# Patient Record
Sex: Female | Born: 1945 | Race: White | Hispanic: No | Marital: Married | Smoking: Never smoker
Health system: Southern US, Community
[De-identification: ages and names within clinical notes are randomized; demographics above are authoritative.]

## PROBLEM LIST (undated history)

## (undated) DIAGNOSIS — E785 Hyperlipidemia, unspecified: Secondary | ICD-10-CM

## (undated) DIAGNOSIS — I10 Essential (primary) hypertension: Secondary | ICD-10-CM

## (undated) DIAGNOSIS — Z8701 Personal history of pneumonia (recurrent): Secondary | ICD-10-CM

## (undated) DIAGNOSIS — T7840XA Allergy, unspecified, initial encounter: Secondary | ICD-10-CM

## (undated) DIAGNOSIS — R011 Cardiac murmur, unspecified: Secondary | ICD-10-CM

## (undated) HISTORY — DX: Personal history of pneumonia (recurrent): Z87.01

## (undated) HISTORY — DX: Essential (primary) hypertension: I10

## (undated) HISTORY — DX: Cardiac murmur, unspecified: R01.1

## (undated) HISTORY — DX: Hyperlipidemia, unspecified: E78.5

## (undated) HISTORY — DX: Allergy, unspecified, initial encounter: T78.40XA

## (undated) HISTORY — PX: OTHER SURGICAL HISTORY: SHX169

---

## 2012-11-18 ENCOUNTER — Encounter (HOSPITAL_COMMUNITY): Payer: Self-pay

## 2012-11-18 ENCOUNTER — Emergency Department (HOSPITAL_COMMUNITY): Payer: Medicare Other

## 2012-11-18 ENCOUNTER — Inpatient Hospital Stay (HOSPITAL_COMMUNITY)
Admission: EM | Admit: 2012-11-18 | Discharge: 2012-11-24 | DRG: 871 | Disposition: A | Discharge status: Home / Self Care | Payer: Medicare Other | Attending: Emergency Medicine | Admitting: Emergency Medicine

## 2012-11-18 DIAGNOSIS — E669 Obesity, unspecified: Secondary | ICD-10-CM | POA: Diagnosis present

## 2012-11-18 DIAGNOSIS — K5289 Other specified noninfective gastroenteritis and colitis: Secondary | ICD-10-CM | POA: Diagnosis present

## 2012-11-18 DIAGNOSIS — E872 Acidosis, unspecified: Secondary | ICD-10-CM | POA: Diagnosis present

## 2012-11-18 DIAGNOSIS — N2589 Other disorders resulting from impaired renal tubular function: Secondary | ICD-10-CM | POA: Diagnosis present

## 2012-11-18 DIAGNOSIS — J9600 Acute respiratory failure, unspecified whether with hypoxia or hypercapnia: Secondary | ICD-10-CM | POA: Diagnosis not present

## 2012-11-18 DIAGNOSIS — E876 Hypokalemia: Secondary | ICD-10-CM | POA: Diagnosis present

## 2012-11-18 DIAGNOSIS — D649 Anemia, unspecified: Secondary | ICD-10-CM | POA: Diagnosis present

## 2012-11-18 DIAGNOSIS — R578 Other shock: Secondary | ICD-10-CM | POA: Diagnosis not present

## 2012-11-18 DIAGNOSIS — A400 Sepsis due to streptococcus, group A: Secondary | ICD-10-CM | POA: Diagnosis not present

## 2012-11-18 DIAGNOSIS — A409 Streptococcal sepsis, unspecified: Principal | ICD-10-CM | POA: Diagnosis present

## 2012-11-18 DIAGNOSIS — N179 Acute kidney failure, unspecified: Secondary | ICD-10-CM | POA: Diagnosis present

## 2012-11-18 DIAGNOSIS — A419 Sepsis, unspecified organism: Secondary | ICD-10-CM | POA: Diagnosis not present

## 2012-11-18 DIAGNOSIS — N390 Urinary tract infection, site not specified: Secondary | ICD-10-CM | POA: Diagnosis present

## 2012-11-18 DIAGNOSIS — R6510 Systemic inflammatory response syndrome (SIRS) of non-infectious origin without acute organ dysfunction: Secondary | ICD-10-CM | POA: Diagnosis present

## 2012-11-18 DIAGNOSIS — A088 Other specified intestinal infections: Secondary | ICD-10-CM | POA: Diagnosis present

## 2012-11-18 DIAGNOSIS — R6520 Severe sepsis without septic shock: Secondary | ICD-10-CM | POA: Diagnosis present

## 2012-11-18 DIAGNOSIS — K529 Noninfective gastroenteritis and colitis, unspecified: Secondary | ICD-10-CM

## 2012-11-18 DIAGNOSIS — G929 Unspecified toxic encephalopathy: Secondary | ICD-10-CM | POA: Diagnosis not present

## 2012-11-18 DIAGNOSIS — G92 Toxic encephalopathy: Secondary | ICD-10-CM | POA: Diagnosis not present

## 2012-11-18 DIAGNOSIS — E871 Hypo-osmolality and hyponatremia: Secondary | ICD-10-CM | POA: Diagnosis present

## 2012-11-18 DIAGNOSIS — J189 Pneumonia, unspecified organism: Secondary | ICD-10-CM | POA: Diagnosis present

## 2012-11-18 LAB — COMPREHENSIVE METABOLIC PANEL
AST: 25 U/L (ref 0–37)
Albumin: 2.9 g/dL — ABNORMAL LOW (ref 3.5–5.2)
CO2: 17 mEq/L — ABNORMAL LOW (ref 19–32)
Calcium: 7.9 mg/dL — ABNORMAL LOW (ref 8.4–10.5)
Creatinine, Ser: 4.32 mg/dL — ABNORMAL HIGH (ref 0.50–1.10)
GFR calc non Af Amer: 10 mL/min — ABNORMAL LOW (ref >90)

## 2012-11-18 LAB — CBC WITH DIFFERENTIAL/PLATELET
Eosinophils Absolute: 0 10*3/uL (ref 0.0–0.7)
MCH: 29.5 pg (ref 26.0–34.0)
MCHC: 34.1 g/dL (ref 30.0–36.0)
Monocytes Absolute: 0.6 10*3/uL (ref 0.1–1.0)
Neutrophils Relative %: 95 % — ABNORMAL HIGH (ref 43–77)
Platelets: 240 10*3/uL (ref 150–400)

## 2012-11-18 LAB — BLOOD GAS, ARTERIAL
Bicarbonate: 13.5 mEq/L — ABNORMAL LOW (ref 20.0–24.0)
Drawn by: 22223
O2 Content: 3 L/min
Patient temperature: 37
pH, Arterial: 7.321 — ABNORMAL LOW (ref 7.350–7.450)
pO2, Arterial: 65.5 mmHg — ABNORMAL LOW (ref 80.0–100.0)

## 2012-11-18 LAB — URINE MICROSCOPIC-ADD ON

## 2012-11-18 LAB — URINALYSIS, ROUTINE W REFLEX MICROSCOPIC
Hgb urine dipstick: NEGATIVE
Specific Gravity, Urine: >1.03 — ABNORMAL HIGH (ref 1.005–1.030)
Urobilinogen, UA: 0.2 mg/dL (ref 0.0–1.0)

## 2012-11-18 LAB — LACTIC ACID, PLASMA: Lactic Acid, Venous: 1.4 mmol/L (ref 0.5–2.2)

## 2012-11-18 LAB — PHOSPHORUS: Phosphorus: 3.9 mg/dL (ref 2.3–4.6)

## 2012-11-18 MED ORDER — SODIUM BICARBONATE 8.4 % IV SOLN
INTRAVENOUS | Status: DC
  Administered 2012-11-19: 01:00:00 via INTRAVENOUS
  Filled 2012-11-18 (×3): qty 1000

## 2012-11-18 MED ORDER — ONDANSETRON HCL 4 MG/2ML IJ SOLN: 4.0000 mg | INTRAMUSCULAR | Status: DC | PRN

## 2012-11-18 MED ORDER — VANCOMYCIN 50 MG/ML ORAL SOLUTION
250.0000 mg | Freq: Four times a day (QID) | ORAL | Status: DC
  Administered 2012-11-19: 250 mg via ORAL
  Filled 2012-11-18 (×6): qty 5

## 2012-11-18 MED ORDER — ONDANSETRON HCL 4 MG/2ML IJ SOLN
4.0000 mg | Freq: Once | INTRAMUSCULAR | Status: AC
  Administered 2012-11-18: 4 mg via INTRAVENOUS
  Filled 2012-11-18: qty 2

## 2012-11-18 MED ORDER — SODIUM CHLORIDE 0.9 % IV BOLUS (SEPSIS): 1000.0000 mL | Freq: Once | INTRAVENOUS | Status: DC

## 2012-11-18 MED ORDER — TRAZODONE HCL 50 MG PO TABS: 50.0000 mg | ORAL_TABLET | Freq: Every evening | ORAL | Status: DC | PRN

## 2012-11-18 MED ORDER — ENOXAPARIN SODIUM 30 MG/0.3ML ~~LOC~~ SOLN
30.0000 mg | SUBCUTANEOUS | Status: DC
  Filled 2012-11-18 (×2): qty 0.3

## 2012-11-18 MED ORDER — CIPROFLOXACIN IN D5W 400 MG/200ML IV SOLN
400.0000 mg | Freq: Once | INTRAVENOUS | Status: AC
  Administered 2012-11-19: 400 mg via INTRAVENOUS
  Filled 2012-11-18: qty 200

## 2012-11-18 MED ORDER — SODIUM CHLORIDE 0.9 % IV SOLN
1000.0000 mL | Freq: Once | INTRAVENOUS | Status: AC
  Administered 2012-11-18: 1000 mL via INTRAVENOUS

## 2012-11-18 MED ORDER — MORPHINE SULFATE 4 MG/ML IJ SOLN: 4.0000 mg | Freq: Once | INTRAMUSCULAR | Status: DC

## 2012-11-18 MED ORDER — ACETAMINOPHEN 325 MG PO TABS: 650.0000 mg | ORAL_TABLET | ORAL | Status: DC | PRN

## 2012-11-18 MED ORDER — METRONIDAZOLE IN NACL 5-0.79 MG/ML-% IV SOLN
500.0000 mg | Freq: Three times a day (TID) | INTRAVENOUS | Status: DC
  Administered 2012-11-19: 500 mg via INTRAVENOUS
  Filled 2012-11-18 (×3): qty 100

## 2012-11-18 MED ORDER — SODIUM CHLORIDE 0.9 % IV SOLN
1000.0000 mL | INTRAVENOUS | Status: DC
  Administered 2012-11-18: 1000 mL via INTRAVENOUS

## 2012-11-18 MED ORDER — ACETAMINOPHEN 500 MG PO TABS
1000.0000 mg | ORAL_TABLET | Freq: Once | ORAL | Status: AC
  Administered 2012-11-18: 1000 mg via ORAL
  Filled 2012-11-18: qty 2

## 2012-11-18 MED ORDER — ONDANSETRON HCL 4 MG/2ML IJ SOLN: 4.0000 mg | Freq: Three times a day (TID) | INTRAMUSCULAR | Status: DC | PRN

## 2012-11-18 MED ORDER — SODIUM CHLORIDE 0.9 % IJ SOLN
3.0000 mL | Freq: Two times a day (BID) | INTRAMUSCULAR | Status: DC
  Administered 2012-11-19 – 2012-11-24 (×7): 3 mL via INTRAVENOUS

## 2012-11-18 MED ORDER — POTASSIUM CHLORIDE 10 MEQ/100ML IV SOLN
10.0000 meq | INTRAVENOUS | Status: AC
  Administered 2012-11-19 (×3): 10 meq via INTRAVENOUS
  Filled 2012-11-18: qty 100
  Filled 2012-11-18: qty 300

## 2012-11-18 MED ORDER — SODIUM CHLORIDE 0.9 % IV BOLUS (SEPSIS)
1000.0000 mL | Freq: Once | INTRAVENOUS | Status: AC
  Administered 2012-11-18: 1000 mL via INTRAVENOUS

## 2012-11-18 MED ORDER — DEXTROSE 5 % IV SOLN
1.0000 g | Freq: Once | INTRAVENOUS | Status: AC
  Administered 2012-11-18: 1 g via INTRAVENOUS
  Filled 2012-11-18: qty 10

## 2012-11-18 MED ORDER — SODIUM CHLORIDE 0.9 % IV SOLN: INTRAVENOUS | Status: DC

## 2012-11-18 NOTE — ED Notes (Signed)
Husband states pt has n/v/d and high fever that started yesterday. Pt lethargic today. Has not been to a doctor sonce [phone]

## 2012-11-18 NOTE — ED Notes (Signed)
Patient arrives with family c/o nausea/vomiting/diarrhea. Patient lethargic, altered per family. Patient with deep respirations, skin pale. Patient tachycardic on monitor. Rectal temp 105.6, cooling blanket applied, PO tylenol given, and IV normal saline bolus initiated. RN at bedside since patient arrived in room.

## 2012-11-18 NOTE — ED Provider Notes (Signed)
History     CSN: [id number]  Arrival date & time 11/18/12  1810   First MD Initiated Contact with Patient 11/18/12 1824      Chief Complaint  Patient presents with  . Emesis    (Consider location/radiation/quality/duration/timing/severity/associated sxs/prior treatment) Patient is a 66 y.o. female presenting with vomiting. The history is provided by the spouse. The history is limited by the condition of the patient (Altered mental status).  Emesis She started getting sick yesterday with vomiting and diarrhea. Multiple family members have had similar illnesses. She was given Imodium for diarrhea. She's continued to vomit today and has vomited at least twice according to her husband. She started running a fever today which has gone up as high as 105. She is noted to be confused. Her daughter had textd it her in the morning and got a response, but this afternoon the daughter sent her checked and did not get a response. She has not gotten anything for the fever her nausea.   History reviewed. No pertinent past medical history.  History reviewed. No pertinent past surgical history.  No family history on file.  History  Substance Use Topics  . Smoking status: Not on file  . Smokeless tobacco: Not on file  . Alcohol Use: No    OB History   Grav Para Term Preterm Abortions TAB SAB Ect Mult Living                  Review of Systems  Unable to perform ROS: Mental status change  Gastrointestinal: Positive for vomiting.    Allergies  Review of patient's allergies indicates no known allergies.  Home Medications  No current outpatient prescriptions on file.  BP 108/65  Pulse 128  Temp(Src) 105.6 F (40.9 C) (Rectal)  Ht 5\' 6"  (1.676 m)  Wt 150 lb (68.04 kg)  BMI 24.22 kg/m2  SpO2 95%  Physical Exam  Nursing note and vitals reviewed.  67 year old female, resting comfortably and in no acute distress. Vital signs are significant for tachycardia with a rate of 128,  tachypnea with respiratory rate of 30, and fever with temperature 105.6 rectally. Oxygen saturation is 95%, which is normal. Head is normocephalic and atraumatic. PERRLA, EOMI. Oropharynx is clear. Neck is nontender and supple without adenopathy or JVD. Back is nontender and there is no CVA tenderness. Lungs are clear without rales, wheezes, or rhonchi. Chest is nontender. Heart is tachycardic and right without murmur. Abdomen is soft, flat, nontender without masses or hepatosplenomegaly and peristalsis is normoactive. Extremities have no cyanosis or edema, full range of motion is present. Skin is warm and dry without rash. Neurologic: She is lethargic but arousable. She is oriented to person. She speaks in very soft whispers. Cranial nerves are grossly intact, there are no gross motor or sensory deficits.  ED Course  Procedures (including critical care time)  Results for orders placed during the hospital encounter of 11/18/12  CBC WITH DIFFERENTIAL      Result Value Range   WBC 28.1 (*) 4.0 - 10.5 K/uL   RBC 4.57  3.87 - 5.11 MIL/uL   Hemoglobin 13.5  12.0 - 15.0 g/dL   HCT 14.7  82.9 - 56.2 %   MCV 86.7  78.0 - 100.0 fL   MCH 29.5  26.0 - 34.0 pg   MCHC 34.1  30.0 - 36.0 g/dL   RDW 13.0  86.5 - 78.4 %   Platelets 240  150 - 400 K/uL   Neutrophils  Relative 95 (*) 43 - 77 %   Lymphocytes Relative 3 (*) 12 - 46 %   Monocytes Relative 2 (*) 3 - 12 %   Eosinophils Relative 0  0 - 5 %   Basophils Relative 0  0 - 1 %   Neutro Abs 26.7 (*) 1.7 - 7.7 K/uL   Lymphs Abs 0.8  0.7 - 4.0 K/uL   Monocytes Absolute 0.6  0.1 - 1.0 K/uL   Eosinophils Absolute 0.0  0.0 - 0.7 K/uL   Basophils Absolute 0.0  0.0 - 0.1 K/uL   WBC Morphology MILD LEFT SHIFT (1-5% METAS, OCC MYELO, OCC BANDS)     Smear Review LARGE PLATELETS PRESENT    COMPREHENSIVE METABOLIC PANEL      Result Value Range   Sodium 128 (*) 135 - 145 mEq/L   Potassium 3.3 (*) 3.5 - 5.1 mEq/L   Chloride 91 (*) 96 - 112 mEq/L   CO2  17 (*) 19 - 32 mEq/L   Glucose, Bld 110 (*) 70 - 99 mg/dL   BUN 56 (*) 6 - 23 mg/dL   Creatinine, Ser 1.61 (*) 0.50 - 1.10 mg/dL   Calcium 7.9 (*) 8.4 - 10.5 mg/dL   Total Protein 7.1  6.0 - 8.3 g/dL   Albumin 2.9 (*) 3.5 - 5.2 g/dL   AST 25  0 - 37 U/L   ALT 35  0 - 35 U/L   Alkaline Phosphatase 149 (*) 39 - 117 U/L   Total Bilirubin 1.5 (*) 0.3 - 1.2 mg/dL   GFR calc non Af Amer 10 (*) >90 mL/min   GFR calc Af Amer 11 (*) >90 mL/min  URINALYSIS, ROUTINE W REFLEX MICROSCOPIC      Result Value Range   Color, Urine YELLOW  YELLOW   APPearance CLEAR  CLEAR   Specific Gravity, Urine >1.030 (*) 1.005 - 1.030   pH 5.5  5.0 - 8.0   Glucose, UA NEGATIVE  NEGATIVE mg/dL   Hgb urine dipstick NEGATIVE  NEGATIVE   Bilirubin Urine NEGATIVE  NEGATIVE   Ketones, ur NEGATIVE  NEGATIVE mg/dL   Protein, ur NEGATIVE  NEGATIVE mg/dL   Urobilinogen, UA 0.2  0.0 - 1.0 mg/dL   Nitrite NEGATIVE  NEGATIVE   Leukocytes, UA TRACE (*) NEGATIVE  URINE MICROSCOPIC-ADD ON      Result Value Range   Squamous Epithelial / LPF RARE  RARE   WBC, UA 21-50  <3 WBC/hpf   RBC / HPF 0-2  <3 RBC/hpf   Bacteria, UA MANY (*) RARE   Casts HYALINE CASTS (*) NEGATIVE   Dg Chest Portable 1 View  11/18/2012  *RADIOLOGY REPORT*  Clinical Data: Altered mental status, vomiting  PORTABLE CHEST - 1 VIEW  Comparison: None.  Findings: Low lung volumes with vascular crowding.  Mild bibasilar opacities, likely atelectasis.  No pleural effusion or pneumothorax.  The heart is normal in size.  IMPRESSION: Low lung volumes with bibasilar atelectasis.   Original Report Authenticated By: Charline Bills, M.D.       1. Urinary tract infection   2. Acute renal failure   3. Hyponatremia   4. Hypokalemia   5. Elevated liver function tests   6. Metabolic acidosis    CRITICAL CARE Performed by: WRUEA,[email or web address]   Total critical care time: 90 minutes  Critical care time was exclusive of separately billable procedures and treating other  patients.  Critical care was necessary to treat or prevent imminent or life-threatening deterioration.  Critical care  was time spent personally by me on the following activities: development of treatment plan with patient and/or surrogate as well as nursing, discussions with consultants, evaluation of patient's response to treatment, examination of patient, obtaining history from patient or surrogate, ordering and performing treatments and interventions, ordering and review of laboratory studies, ordering and review of radiographic studies, pulse oximetry and re-evaluation of patient's condition.    MDM  Fever and altered mental status. It is not clear at this point whether altered mental status is slowly due to the fever. Septic workup will be done and she'll be given IV hydration and antipyretics.  Laboratory workup is significant for marked leukocytosis with WBC of 28.1, hyponatremia with sodium 128, metabolic acidosis with CO2 of 17 with elevated anion gap of 20, elevated BUN and creatinine of 56 and 4.3 to. Urinalysis shows evidence of infection with 21-50 WBCs, many back., And granular casts. She is started on ceftriaxone for UTI. Renal failure appears to be acute given the normal hemoglobin. With fever and altered mental status, need to consider possibility of thrombotic thrombocytopenic purpura but she does not have significant changes in liver enzymes (mild elevation of alkaline phosphatase and bilirubin with normal transaminases) and she has normal platelet count. Blood pressure did drop briefly to the 90s and responded well to additional fluid boluses. Temperatures come down to 102.4 and she is more awake and responsive. Case is been discussed with Dr. Orvan Falconer and she will be admitted to the step down unit.      Dione Booze, MD 11/18/12 2150

## 2012-11-19 ENCOUNTER — Encounter (HOSPITAL_COMMUNITY): Payer: Self-pay | Admitting: Anesthesiology

## 2012-11-19 ENCOUNTER — Inpatient Hospital Stay (HOSPITAL_COMMUNITY): Payer: Medicare Other | Admitting: Anesthesiology

## 2012-11-19 ENCOUNTER — Inpatient Hospital Stay (HOSPITAL_COMMUNITY): Payer: Medicare Other

## 2012-11-19 ENCOUNTER — Encounter (HOSPITAL_COMMUNITY): Payer: Self-pay | Admitting: Internal Medicine

## 2012-11-19 DIAGNOSIS — E872 Acidosis, unspecified: Secondary | ICD-10-CM

## 2012-11-19 DIAGNOSIS — J9600 Acute respiratory failure, unspecified whether with hypoxia or hypercapnia: Secondary | ICD-10-CM | POA: Diagnosis present

## 2012-11-19 DIAGNOSIS — N179 Acute kidney failure, unspecified: Secondary | ICD-10-CM

## 2012-11-19 DIAGNOSIS — K5289 Other specified noninfective gastroenteritis and colitis: Secondary | ICD-10-CM

## 2012-11-19 DIAGNOSIS — N390 Urinary tract infection, site not specified: Secondary | ICD-10-CM

## 2012-11-19 DIAGNOSIS — J189 Pneumonia, unspecified organism: Secondary | ICD-10-CM | POA: Diagnosis present

## 2012-11-19 LAB — RENAL FUNCTION PANEL
Albumin: 2 g/dL — ABNORMAL LOW (ref 3.5–5.2)
BUN: 55 mg/dL — ABNORMAL HIGH (ref 6–23)
Chloride: 102 mEq/L (ref 96–112)
Creatinine, Ser: 3.97 mg/dL — ABNORMAL HIGH (ref 0.50–1.10)
Phosphorus: 4.6 mg/dL (ref 2.3–4.6)

## 2012-11-19 LAB — MRSA PCR SCREENING: MRSA by PCR: NEGATIVE

## 2012-11-19 LAB — URINALYSIS, ROUTINE W REFLEX MICROSCOPIC
Bilirubin Urine: NEGATIVE
Ketones, ur: NEGATIVE mg/dL
Nitrite: NEGATIVE
Protein, ur: 100 mg/dL — AB
Urobilinogen, UA: 0.2 mg/dL (ref 0.0–1.0)
pH: 5 (ref 5.0–8.0)

## 2012-11-19 LAB — CBC
HCT: 36 % (ref 36.0–46.0)
MCH: 29.7 pg (ref 26.0–34.0)
MCH: 29.6 pg (ref 26.0–34.0)
MCHC: 34.4 g/dL (ref 30.0–36.0)
MCHC: 35.1 g/dL (ref 30.0–36.0)
Platelets: 177 10*3/uL (ref 150–400)
RDW: 14 % (ref 11.5–15.5)
RDW: 14.5 % (ref 11.5–15.5)

## 2012-11-19 LAB — CREATININE, URINE, RANDOM: Creatinine, Urine: 105.49 mg/dL

## 2012-11-19 LAB — MAGNESIUM: Magnesium: 1.3 mg/dL — ABNORMAL LOW (ref 1.5–2.5)

## 2012-11-19 LAB — COMPREHENSIVE METABOLIC PANEL
Albumin: 2.3 g/dL — ABNORMAL LOW (ref 3.5–5.2)
Alkaline Phosphatase: 119 U/L — ABNORMAL HIGH (ref 39–117)
BUN: 54 mg/dL — ABNORMAL HIGH (ref 6–23)
Chloride: 98 mEq/L (ref 96–112)
GFR calc Af Amer: 13 mL/min — ABNORMAL LOW (ref >90)
Glucose, Bld: 143 mg/dL — ABNORMAL HIGH (ref 70–99)
Potassium: 3.8 mEq/L (ref 3.5–5.1)
Total Bilirubin: 1.1 mg/dL (ref 0.3–1.2)

## 2012-11-19 LAB — URINE MICROSCOPIC-ADD ON

## 2012-11-19 LAB — URINE CULTURE
Colony Count: NO GROWTH
Culture: NO GROWTH

## 2012-11-19 LAB — CREATININE, SERUM: Creatinine, Ser: 3.86 mg/dL — ABNORMAL HIGH (ref 0.50–1.10)

## 2012-11-19 LAB — POCT I-STAT 3, ART BLOOD GAS (G3+)
Acid-base deficit: 14 mmol/L — ABNORMAL HIGH (ref 0.0–2.0)
O2 Saturation: 96 %

## 2012-11-19 LAB — BLOOD GAS, ARTERIAL
Acid-base deficit: 11.8 mmol/L — ABNORMAL HIGH (ref 0.0–2.0)
Acid-base deficit: 13.7 mmol/L — ABNORMAL HIGH (ref 0.0–2.0)
Bicarbonate: 13.9 mEq/L — ABNORMAL LOW (ref 20.0–24.0)
FIO2: 0.6 %
MECHVT: 500 mL
O2 Saturation: 99.8 %
RATE: 25 resp/min
TCO2: 12.9 mmol/L (ref 0–100)
TCO2: 11.9 mmol/L (ref 0–100)
pCO2 arterial: 32.4 mmHg — ABNORMAL LOW (ref 35.0–45.0)
pCO2 arterial: 23.3 mmHg — ABNORMAL LOW (ref 35.0–45.0)
pO2, Arterial: 227 mmHg — ABNORMAL HIGH (ref 80.0–100.0)

## 2012-11-19 LAB — GLUCOSE, CAPILLARY: Glucose-Capillary: 107 mg/dL — ABNORMAL HIGH (ref 70–99)

## 2012-11-19 LAB — LACTIC ACID, PLASMA
Lactic Acid, Venous: 2 mmol/L (ref 0.5–2.2)
Lactic Acid, Venous: 1.4 mmol/L (ref 0.5–2.2)

## 2012-11-19 LAB — CLOSTRIDIUM DIFFICILE BY PCR: Toxigenic C. Difficile by PCR: NEGATIVE

## 2012-11-19 LAB — PROTEIN, URINE, RANDOM: Total Protein, Urine: 124.5 mg/dL

## 2012-11-19 MED ORDER — DEXTROSE 5 % IV SOLN
1.0000 g | INTRAVENOUS | Status: DC
  Administered 2012-11-20 – 2012-11-22 (×3): 1 g via INTRAVENOUS
  Filled 2012-11-19 (×4): qty 1

## 2012-11-19 MED ORDER — SUCCINYLCHOLINE CHLORIDE 20 MG/ML IJ SOLN
INTRAMUSCULAR | Status: AC
  Administered 2012-11-19: 04:00:00
  Filled 2012-11-19: qty 1

## 2012-11-19 MED ORDER — SUCCINYLCHOLINE CHLORIDE 20 MG/ML IJ SOLN
INTRAMUSCULAR | Status: AC
  Administered 2012-11-19: 100 mg via INTRAVENOUS
  Administered 2012-11-19: 80 mg via INTRAVENOUS
  Filled 2012-11-19: qty 1

## 2012-11-19 MED ORDER — INSULIN ASPART 100 UNIT/ML ~~LOC~~ SOLN: 0.0000 [IU] | SUBCUTANEOUS | Status: DC

## 2012-11-19 MED ORDER — MIDAZOLAM HCL 2 MG/2ML IJ SOLN
1.0000 mg | INTRAMUSCULAR | Status: DC | PRN
  Filled 2012-11-19: qty 2

## 2012-11-19 MED ORDER — POTASSIUM CHLORIDE 20 MEQ/15ML (10%) PO LIQD
40.0000 meq | Freq: Once | ORAL | Status: AC
  Administered 2012-11-19: 40 meq
  Filled 2012-11-19: qty 30

## 2012-11-19 MED ORDER — SODIUM BICARBONATE 8.4 % IV SOLN
INTRAVENOUS | Status: AC
  Filled 2012-11-19: qty 150

## 2012-11-19 MED ORDER — BIOTENE DRY MOUTH MT LIQD
15.0000 mL | Freq: Four times a day (QID) | OROMUCOSAL | Status: DC
  Administered 2012-11-19 – 2012-11-20 (×6): 15 mL via OROMUCOSAL

## 2012-11-19 MED ORDER — PROPOFOL 10 MG/ML IV EMUL
INTRAVENOUS | Status: AC
  Filled 2012-11-19: qty 100

## 2012-11-19 MED ORDER — CIPROFLOXACIN IN D5W 400 MG/200ML IV SOLN
INTRAVENOUS | Status: AC
  Filled 2012-11-19: qty 200

## 2012-11-19 MED ORDER — ROCURONIUM BROMIDE 50 MG/5ML IV SOLN
INTRAVENOUS | Status: AC
  Filled 2012-11-19: qty 2

## 2012-11-19 MED ORDER — VANCOMYCIN HCL IN DEXTROSE 1-5 GM/200ML-% IV SOLN
1000.0000 mg | INTRAVENOUS | Status: DC
  Filled 2012-11-19: qty 200

## 2012-11-19 MED ORDER — METRONIDAZOLE IN NACL 5-0.79 MG/ML-% IV SOLN
INTRAVENOUS | Status: AC
  Filled 2012-11-19: qty 200

## 2012-11-19 MED ORDER — DEXTROSE 5 % IV SOLN
1.0000 g | Freq: Once | INTRAVENOUS | Status: AC
  Administered 2012-11-19: 1 g via INTRAVENOUS
  Filled 2012-11-19: qty 1

## 2012-11-19 MED ORDER — FAMOTIDINE 40 MG/5ML PO SUSR
20.0000 mg | Freq: Two times a day (BID) | ORAL | Status: DC
  Filled 2012-11-19 (×2): qty 2.5

## 2012-11-19 MED ORDER — PANTOPRAZOLE SODIUM 40 MG IV SOLR
40.0000 mg | Freq: Every day | INTRAVENOUS | Status: DC
  Filled 2012-11-19: qty 40

## 2012-11-19 MED ORDER — SODIUM CHLORIDE 0.9 % IV SOLN
INTRAVENOUS | Status: DC
  Administered 2012-11-19: 03:00:00 via INTRAVENOUS

## 2012-11-19 MED ORDER — VANCOMYCIN 50 MG/ML ORAL SOLUTION
ORAL | Status: AC
  Filled 2012-11-19: qty 10

## 2012-11-19 MED ORDER — ETOMIDATE 2 MG/ML IV SOLN
INTRAVENOUS | Status: AC
  Administered 2012-11-19: 04:00:00
  Filled 2012-11-19: qty 10

## 2012-11-19 MED ORDER — RISAQUAD PO CAPS
2.0000 | ORAL_CAPSULE | Freq: Every day | ORAL | Status: DC
  Administered 2012-11-19 – 2012-11-24 (×6): 2 via ORAL
  Filled 2012-11-19 (×7): qty 2

## 2012-11-19 MED ORDER — SODIUM CHLORIDE 0.9 % IV SOLN
1.0000 g | Freq: Once | INTRAVENOUS | Status: AC
  Administered 2012-11-19: 1 g via INTRAVENOUS
  Filled 2012-11-19: qty 10

## 2012-11-19 MED ORDER — VANCOMYCIN HCL IN DEXTROSE 1-5 GM/200ML-% IV SOLN
1000.0000 mg | Freq: Once | INTRAVENOUS | Status: AC
  Administered 2012-11-19: 1000 mg via INTRAVENOUS
  Filled 2012-11-19: qty 200

## 2012-11-19 MED ORDER — SODIUM CHLORIDE 0.9 % IV SOLN
Freq: Once | INTRAVENOUS | Status: AC
  Administered 2012-11-19: 500 mL/h via INTRAVENOUS

## 2012-11-19 MED ORDER — DEXTROSE 5 % IV SOLN
INTRAVENOUS | Status: AC
  Filled 2012-11-19: qty 1

## 2012-11-19 MED ORDER — ASPIRIN 81 MG PO CHEW
324.0000 mg | CHEWABLE_TABLET | ORAL | Status: AC
  Administered 2012-11-19: 324 mg via ORAL
  Filled 2012-11-19 (×2): qty 1
  Filled 2012-11-19: qty 3

## 2012-11-19 MED ORDER — HEPARIN SODIUM (PORCINE) 1000 UNIT/ML IJ SOLN
INTRAMUSCULAR | Status: AC
  Administered 2012-11-19: 1000 [IU]
  Filled 2012-11-19: qty 4

## 2012-11-19 MED ORDER — FENTANYL BOLUS VIA INFUSION
25.0000 ug | Freq: Four times a day (QID) | INTRAVENOUS | Status: DC | PRN
  Filled 2012-11-19: qty 100

## 2012-11-19 MED ORDER — VANCOMYCIN HCL IN DEXTROSE 1-5 GM/200ML-% IV SOLN
INTRAVENOUS | Status: AC
  Filled 2012-11-19: qty 200

## 2012-11-19 MED ORDER — CHLORHEXIDINE GLUCONATE 0.12 % MT SOLN
15.0000 mL | Freq: Two times a day (BID) | OROMUCOSAL | Status: DC
  Administered 2012-11-19 (×2): 15 mL via OROMUCOSAL
  Filled 2012-11-19: qty 15

## 2012-11-19 MED ORDER — FENTANYL CITRATE 0.05 MG/ML IJ SOLN
25.0000 ug | INTRAMUSCULAR | Status: DC | PRN
  Administered 2012-11-19: 50 ug via INTRAVENOUS
  Administered 2012-11-19: 75 ug via INTRAVENOUS
  Filled 2012-11-19 (×3): qty 2

## 2012-11-19 MED ORDER — SODIUM CHLORIDE 0.9 % IV SOLN
25.0000 ug/h | INTRAVENOUS | Status: DC
  Filled 2012-11-19: qty 50

## 2012-11-19 MED ORDER — SODIUM CHLORIDE 0.9 % IV SOLN
INTRAVENOUS | Status: DC
  Filled 2012-11-19: qty 1000

## 2012-11-19 MED ORDER — FAMOTIDINE 40 MG/5ML PO SUSR
20.0000 mg | Freq: Every day | ORAL | Status: DC
  Administered 2012-11-19: 20 mg
  Filled 2012-11-19 (×2): qty 2.5

## 2012-11-19 MED ORDER — LIDOCAINE HCL (CARDIAC) 20 MG/ML IV SOLN
INTRAVENOUS | Status: AC
  Filled 2012-11-19: qty 5

## 2012-11-19 MED ORDER — SODIUM CHLORIDE 0.9 % IV SOLN
250.0000 mL | INTRAVENOUS | Status: DC | PRN
  Administered 2012-11-19: 20 mL via INTRAVENOUS

## 2012-11-19 MED ORDER — MIDAZOLAM HCL 2 MG/2ML IJ SOLN: 2.0000 mg | INTRAMUSCULAR | Status: DC | PRN

## 2012-11-19 MED ORDER — MAGNESIUM SULFATE 40 MG/ML IJ SOLN
2.0000 g | Freq: Once | INTRAMUSCULAR | Status: AC
  Administered 2012-11-19: 2 g via INTRAVENOUS
  Filled 2012-11-19: qty 50

## 2012-11-19 MED ORDER — SODIUM CHLORIDE 0.9 % IV SOLN
1000.0000 mL | Freq: Once | INTRAVENOUS | Status: AC
  Administered 2012-11-19: 500 mL via INTRAVENOUS

## 2012-11-19 MED ORDER — HEPARIN SODIUM (PORCINE) 5000 UNIT/ML IJ SOLN
5000.0000 [IU] | Freq: Three times a day (TID) | INTRAMUSCULAR | Status: DC
  Administered 2012-11-19 – 2012-11-24 (×15): 5000 [IU] via SUBCUTANEOUS
  Filled 2012-11-19 (×18): qty 1

## 2012-11-19 MED ORDER — SODIUM BICARBONATE 8.4 % IV SOLN
INTRAVENOUS | Status: DC
  Administered 2012-11-19 (×2): via INTRAVENOUS
  Filled 2012-11-19 (×6): qty 150

## 2012-11-19 MED ORDER — ETOMIDATE 2 MG/ML IV SOLN
INTRAVENOUS | Status: AC
  Administered 2012-11-19: 10 mg via INTRAVENOUS
  Filled 2012-11-19: qty 20

## 2012-11-19 MED ORDER — PROPOFOL 10 MG/ML IV EMUL
5.0000 ug/kg/min | INTRAVENOUS | Status: DC
  Administered 2012-11-19: 5 ug/kg/min via INTRAVENOUS

## 2012-11-19 MED ORDER — ASPIRIN 300 MG RE SUPP
300.0000 mg | RECTAL | Status: AC
  Filled 2012-11-19: qty 1

## 2012-11-19 NOTE — Progress Notes (Signed)
I was call for an ABG at 0300 because of increased respiratory distress. ABG drawn no critical values to call to nurse but patient looks worst than results. As I was doing rounds Dr. Darrick Penna called to get Dr. Orvan Falconer number to speak about this patient and then there were orders for intubation and ABG's one hour after. ED Dr. Lynelle Doctor came and attempt to intubate with no success and had very difficult time, CRNA called to intubate pt had a lot of swelling and bleeding in her airways. Attempted to intubate unsuccessful, so the anesthesiologist was call to intubate. Pt bagged with 100% O2 between attempts and while waiting for Dr and CRNA to get here to intubate. Pt Spo2 ranged from 85-92% Pt finally intubate with 7.0 tube secure at 22@ teeth  Confirmed by CO2 and breath sounds

## 2012-11-19 NOTE — H&P (Signed)
PULMONARY  / CRITICAL CARE MEDICINE  Name: Deanna Becker MRN: [id number] DOB: Jun 24, 1946    ADMISSION DATE:  11/18/2012  REFERRING MD :  Guy Franco [facility]  CHIEF COMPLAINT:  AMS, metabolic acidosis, VDRF  BRIEF PATIENT DESCRIPTION: 67 yo woman, little PMH, presented to [facility] after several days diarrheal illness, evolving AMS and then resp failure. UA suggestive UTI. Intubated and transferred to [facility] for ARF and consideration for HD.   SIGNIFICANT EVENTS / STUDIES:   LINES / TUBES: ETT 4/11 >>  HD cath 4/11 >>   CULTURES: Blood 4/10 >>  Urine 4/10 >>  Urine 4/11 >>  MRSA screen 4/10 >> negative C diff 4/11 >>   ANTIBIOTICS: vanco IV 4/10 >>   Cefepime 4/11 >>  Levaquin 4/11 x 1 dose Ceftriaxone 4/10 x 1 dose Flagyl IV 4/11 x 1 dose vanco PO 4/11 x 1 dose   HISTORY OF PRESENT ILLNESS:  67 yo woman, little PMH, presented to [facility] after several days diarrheal illness, evolving AMS and then resp failure. UA suggestive UTI. Intubated and transferred to [facility] for ARF and consideration for HD.   PAST MEDICAL HISTORY :  History reviewed. No pertinent past medical history. History reviewed. No pertinent past surgical history. Prior to Admission medications   Medication Sig Start Date End Date Taking? Authorizing Provider  ibuprofen (ADVIL,MOTRIN) 200 MG tablet Take 200 mg by mouth daily as needed for pain.   Yes Historical Provider, MD  loratadine (CLARITIN) 10 MG tablet Take 10 mg by mouth every other day.   Yes Historical Provider, MD   No Known Allergies  FAMILY HISTORY:  Family History  Problem Relation Age of Onset  . Asthma Mother   . Heart disease Father    SOCIAL HISTORY:  reports that she does not drink alcohol or use illicit drugs. Her tobacco history is not on file. 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   REVIEW OF SYSTEMS:  Pt unable to give as intubated. Family indicates diarrhea, nausea, vomiting,  HA, evolving lethargy and confusion.   SUBJECTIVE:  Intubated, awake Received 2L IVF - has made 400cc urine  VITAL SIGNS: Temp:  [99 F (37.2 C)-105.6 F (40.9 C)] 100.2 F (37.9 C) (04/11 0749) Pulse Rate:  [79-134] 125 (04/11 0600) Resp:  [16-35] 29 (04/11 0615) BP: (71-203)/(28-133) 130/89 mmHg (04/11 0600) SpO2:  [26 %-100 %] 98 % (04/11 0600) FiO2 (%):  [40 %-60 %] 40 % (04/11 0827) Weight:  [68.04 kg (150 lb)-75.8 kg (167 lb 1.7 oz)] 75.8 kg (167 lb 1.7 oz) (04/11 0500) HEMODYNAMICS:   VENTILATOR SETTINGS: Vent Mode:  [-] PRVC FiO2 (%):  [40 %-60 %] 40 % Set Rate:  [18 bmp-25 bmp] 18 bmp Vt Set:  [500 mL] 500 mL PEEP:  [5 cmH20] 5 cmH20 INTAKE / OUTPUT: Intake/Output     04/10 0701 - 04/11 0700 04/11 0701 - 04/12 0700   I.V. (mL/kg) 316.3 (4.2)    IV Piggyback 1750    Total Intake(mL/kg) 2066.3 (27.3)    Urine (mL/kg/hr) 400    Total Output 400     Net +1666.3            PHYSICAL EXAMINATION: General:  Obese woman, ventilated, NAD Neuro:  Sedated but wakes easily, nods appropriately to questions, follows commands, moves all ext HEENT:  OP moist, ETT in place, PERRL Neck: large neck, unable to assess JVD,  Cardiovascular:  Regular, no M Lungs:  Decreased at bases, no wheeze or crackles Abdomen:  Obese, slightly distended, non-tender, + BS Musculoskeletal:  No deformities, no edema Skin:  No rash  LABS:  Recent Labs Lab 11/18/12 1845 11/19/12 0240  HGB 13.5 12.4  HCT 39.6 36.0  WBC 28.1* 33.1*  PLT 240 207    Recent Labs Lab 11/18/12 1845 11/18/12 2106 11/19/12 0240  NA 128*  --  128*  K 3.3*  --  3.8  CL 91*  --  98  CO2 17*  --  13*  GLUCOSE 110*  --  143*  BUN 56*  --  54*  CREATININE 4.32*  --  3.99*  CALCIUM 7.9*  --  6.6*  MG  --   --  1.3*  PHOS  --  3.9  --     Recent Labs Lab 11/18/12 2330 11/19/12 0305 11/19/12 0806  PHART 7.321* 7.256* 7.310*  PCO2ART 26.8* 32.4* 23.3*  PO2ART 65.5* 162.0* 227.0*  HCO3 13.5* 13.9*  11.2*  TCO2 11.8 12.9 11.9  O2SAT 90.6 98.2 99.8   No results found for this basename: PROCALCITON,  in the last 168 hours   CXR: 4/11 >> bibasilar volume   ASSESSMENT / PLAN:  PULMONARY A:VDRF, secondary to metabolic acidosis + altered MS P:   - PRVC, plan continue until underlying metabolic issues and hemodynamics stabilized - follow ABG and adjust Ve as needed  CARDIOVASCULAR A: Severe Sepsis, relative hypotension and septic shock + hypovolemic shock from GI losses P:  - place CVC (trialysis catheter) for CVP's and to guide volume administration.  - abx as per ID section - consider other w/u for shock depending on CVP results (ie TTE, etc)  RENAL A:  Acute renal failure, hypovolemia + probable NSAIDs AG Metabolic acidosis due to renal failure P:   - volume resuscitation - lactate now and in am - start bicarb gtt 4/11 - renal consultation; will place HD cath now given risk for needing HD or CVVH. She is making some urine so may be able to avoid. - renal US - repeat UA  - urine protein and Cr  GASTROINTESTINAL A:  Gastroenteritis, presumed viral. Received single doses enteral vanco + IV flagyl at [facility]  P:   - C diff pending, hold off on further vanco/flagyl until results obtained.  - supportive care  HEMATOLOGIC A:  Leukocytosis P:  - see ID section  INFECTIOUS A:  Gastroenteritis, presumed viral UTI based on positive UA 4/10 [facility] bacteremia, called result from [facility] 4/11 P:   - vanco IV + cefepime - will likely need TTE during the hospitalization to eval for endocarditis  ENDOCRINE A:  No issues P:   - follow glucose on BMP; at risk for DM given obesity (she does not follow w a PCP)  NEUROLOGIC A:  Toxic/Metabolic Encephalopathy P:   - supportive care  TODAY'S SUMMARY:   I have personally obtained a history, examined the patient, evaluated laboratory and imaging results, formulated the assessment and plan and placed orders.  CRITICAL CARE: The  patient is critically ill with multiple organ systems failure and requires high complexity decision making for assessment and support, frequent evaluation and titration of therapies, application of advanced monitoring technologies and extensive interpretation of multiple databases. Critical Care Time devoted to patient care services described in this note is 80 minutes.   Levy Pupa, MD, PhD 11/19/2012, 9:53 AM [facility] [phone] or if no answer [phone]

## 2012-11-19 NOTE — Progress Notes (Signed)
ANTIBIOTIC CONSULT NOTE - INITIAL  Pharmacy Consult for Vancomycin and Cefepime Indication: bactermia, pneumonia   and UTI coverage  No Known Allergies  Patient Measurements: Height: 5\' 6"  (167.6 cm) Weight: 167 lb 1.7 oz (75.8 kg) IBW/kg (Calculated) : 59.3  Vital Signs: Temp: 98.4 F (36.9 C) (04/11 1200) Temp src: Oral (04/11 0749) BP: 178/86 mmHg (04/11 1552) Pulse Rate: 113 (04/11 1552) Intake/Output from previous day: 04/10 0701 - 04/11 0700 In: 2166.3 [I.V.:416.3; IV Piggyback:1750] Out: 400 [Urine:400] Intake/Output from this shift: Total I/O In: 2225 [I.V.:2225] Out: 185 [Urine:185]  Labs:  Recent Labs  11/18/12 1845 11/19/12 0240 11/19/12 0733 11/19/12 0839  WBC 28.1* 33.1*  --  27.1*  HGB 13.5 12.4  --  11.4*  PLT 240 207  --  177  LABCREA  --   --  105.49  --   CREATININE 4.32* 3.99*  --  3.86*   Estimated Creatinine Clearance: 15.1 ml/min (by C-G formula based on Cr of 3.86).  Microbiology: Recent Results (from the past 720 hour(s))  CULTURE, BLOOD (ROUTINE X 2)     Status: None   Collection Time    11/18/12  6:45 PM      Result Value Range Status   Specimen Description BLOOD LEFT ANTECUBITAL   Final   Special Requests     Final   Value: BOTTLES DRAWN AEROBIC AND ANAEROBIC AEB=6CC ANA=4CC   Culture  Setup Time     Final   Value: GRAM POSITIVE COCCI IN CHAINS     Gram Stain Report Called to,Read Back By and Verified With: FOUNTAIN, R. (MC) AT 08:45AM ON 11/19/12 BY PRUITT, C.   Culture NO GROWTH 1 DAY   Final   Report Status PENDING   Incomplete  CULTURE, BLOOD (ROUTINE X 2)     Status: None   Collection Time    11/18/12  6:55 PM      Result Value Range Status   Specimen Description BLOOD LEFT HAND   Final   Special Requests BOTTLES DRAWN AEROBIC ONLY 6CC   Final   Culture  Setup Time     Final   Value: GRAM POSITIVE COCCI IN CHAINS     Gram Stain Report Called to,Read Back By and Verified With:  FOUNTAIN, R. (MC) AT 08:45AM ON 11/19/12 BY  PRUITT, C   Culture NO GROWTH 1 DAY   Final   Report Status PENDING   Incomplete  MRSA PCR SCREENING     Status: None   Collection Time    11/18/12 10:30 PM      Result Value Range Status   MRSA by PCR NEGATIVE  NEGATIVE Final   Comment:            The GeneXpert MRSA Assay (FDA     approved for NASAL specimens     only), is one component of a     comprehensive MRSA colonization     surveillance program. It is not     intended to diagnose MRSA     infection nor to guide or     monitor treatment for     MRSA infections.   Assessment:   67 year old woman transferred to [facility] from [place] early today with AMS, metabolic acidosis and ARI. Temp to 105.6, diarrhea.  Consideration for HD on transfer, but now making urine after volume resuscitation.     Antibiotics were initiated at [facility]:   Vancomycin 250 mg PO (1:30am), Cipro 400 mg IV (  2am), Flagyl 500 mg IV (2am),Cefepime 1 gram IV (3am), Vancomycin 1 gram IV (3am).   Blood cultures with gram + cocci - chains.  Bacteria in UA.   To continue with Cefepime and Vancomycin for now.   C diff PCR pending.  Risaquid begun today.    Goal of Therapy:  Vancomycin trough level 15-20 mcg/ml appropriate Cefepime dose for renal function and infection  Plan:   Cefepime with 1 gram IV q24hrs.  Vancomycin 1 gram IV q48hrs. Next due 4/13 am.  Will follow renal function for any need to adjust regimens.    Follow culture data and clinical status.  Dennie Fetters, [place] Pager: [phone] 11/19/2012,4:00 PM

## 2012-11-19 NOTE — H&P (Addendum)
[facility] History and Physical  Deanna Becker  WUJ:[id number]  DOB: 1946-04-04   DOA: 11/18/2012   PCP:   No primary provider on file.   Chief Complaint:  Nausea vomiting and diarrhea since yesterday HPI: Deanna Becker is an 67 y.o. female.   No medical followup for over 40 years because of fear of doctors. Over the past 2 weeks or members of her immediate family have been having a gastrointestinal illness. The patient started having nausea vomiting diarrhea since yesterday, with fever and chills, much worse today and is become  confused so she was brought to the emergency room by family. She is unable to give a clear history because of her confusion, but there is an indication that she was taking low at deemed to help with the cough, Imodium to help with the diarrhea, and ibuprofen for pains and fever.  She denies neck stiffness  In the emergency room she was drowsy confused appeared markedly dehydrated, her white count was elevated to 28, her creatinine was elevated to 4.3; her albumin is 2.9 with a total protein of 7.1. Urinalysis was normal and the hospitalist service was called to admit for urinary tract infection and acute renal failure.  Rewiew of Systems:  Review of systems is not completely reliable because of patient's confusion  All systems negative except as marked bold or noted in the HPI;  Constitutional:    malaise, fever and chills. ;  Eyes:   eye pain, redness and discharge. ;  ENMT:   ear pain, hoarseness, nasal congestion, sinus pressure and sore throat. ;  Cardiovascular:    chest pain, palpitations, diaphoresis, dyspnea and peripheral edema.  Respiratory:   cough, hemoptysis, wheezing and stridor. ;  Gastrointestinal:  Anorexia; nausea, vomiting, diarrhea, constipation, abdominal pain, melena, blood in stool, hematemesis, jaundice and rectal bleeding. unusual weight loss..   Genitourinary:    frequency, dysuria, incontinence,flank pain and  hematuria; Musculoskeletal:   back pain and neck pain.  swelling and trauma.;  Skin: .  pruritus, rash, abrasions, bruising and skin lesion.; ulcerations Neuro:    headache, lightheadedness and neck stiffness.  weakness, altered level of consciousness, altered mental status, extremity weakness, burning feet, involuntary movement, seizure and syncope.  Psych:    anxiety, depression, insomnia, tearfulness, panic attacks, hallucinations, paranoia, suicidal or homicidal ideation    History reviewed. No pertinent past medical history.  History reviewed. No pertinent past surgical history.  Medications:  HOME MEDS: Prior to Admission medications   Medication Sig Start Date End Date Taking? Authorizing Provider  ibuprofen (ADVIL,MOTRIN) 200 MG tablet Take 200 mg by mouth daily as needed for pain.   Yes Historical Provider, MD  loratadine (CLARITIN) 10 MG tablet Take 10 mg by mouth every other day.   Yes Historical Provider, MD     Allergies:  No Known Allergies  Social History:   reports that she does not drink alcohol or use illicit drugs. Her tobacco history is not on file.  Family History: Family History  Problem Relation Age of Onset  . Asthma Mother   . Heart disease Father      Physical Exam: Filed Vitals:   11/18/12 2245 11/18/12 2300 11/18/12 2315 11/18/12 2356  BP: 123/56 115/59 127/69   Pulse:  125 124   Temp:      TempSrc:      Resp: 27 28 25    Height:      Weight:      SpO2:  93% 93% 95%  Blood pressure 127/69, pulse 124, temperature 99 F (37.2 C), temperature source Oral, resp. rate 25, height 5\' 6"  (1.676 m), weight 75.8 kg (167 lb 1.7 oz), SpO2 95.00%.  GEN:  Pleasant but ill-looking obese Caucasian lady lying bed, in moderate respiratory distress cooperative with exam PSYCH:  alert and oriented x2;  anxious ; affect is appropriate. HEENT: Mucous membranes pink, dry and anicteric; there is crusting around the eyelids PERRLA; EOM intact; no cervical  lymphadenopathy nor thyromegaly or carotid bruit; there seems to the bilateral submandibular salivary gland hypertrophy; she has a very thick neck no JVD appreciated; Breasts:: Not examined CHEST WALL: No tenderness CHEST: Tachypneic, clear to auscultation bilaterally HEART: Tachycardic; no murmurs rubs or gallops BACK: No kyphosis no scoliosis; no CVA tenderness ABDOMEN: Obese, soft non-tender; no masses, no organomegaly, normal abdominal bowel sounds; moderate pannus; no intertriginous candida. Rectal Exam: Not done EXTREMITIES:  age-appropriate arthropathy of the hands and knees; no edema; no ulcerations. Genitalia: not examined PULSES: 2+ and symmetric SKIN: Her skin is flush; Normal hydration no rash or ulceration CNS: Cranial nerves 2-12 grossly intact no focal lateralizing neurologic deficit   Labs on Admission:  Basic Metabolic Panel:  Recent Labs Lab 11/18/12 1845 11/18/12 2106  NA 128*  --   K 3.3*  --   CL 91*  --   CO2 17*  --   GLUCOSE 110*  --   BUN 56*  --   CREATININE 4.32*  --   CALCIUM 7.9*  --   PHOS  --  3.9   Liver Function Tests:  Recent Labs Lab 11/18/12 1845  AST 25  ALT 35  ALKPHOS 149*  BILITOT 1.5*  PROT 7.1  ALBUMIN 2.9*   No results found for this basename: LIPASE, AMYLASE,  in the last 168 hours No results found for this basename: AMMONIA,  in the last 168 hours CBC:  Recent Labs Lab 11/18/12 1845  WBC 28.1*  NEUTROABS 26.7*  HGB 13.5  HCT 39.6  MCV 86.7  PLT 240   Cardiac Enzymes:  Recent Labs Lab 11/18/12 2254  CKTOTAL 59   BNP: No components found with this basename: POCBNP,  D-dimer: No components found with this basename: D-DIMER,  CBG: No results found for this basename: GLUCAP,  in the last 168 hours  Radiological Exams on Admission: Dg Chest Portable 1 View  11/18/2012  *RADIOLOGY REPORT*  Clinical Data: Altered mental status, vomiting  PORTABLE CHEST - 1 VIEW  Comparison: None.  Findings: Low lung volumes  with vascular crowding.  Mild bibasilar opacities, likely atelectasis.  No pleural effusion or pneumothorax.  The heart is normal in size.  IMPRESSION: Low lung volumes with bibasilar atelectasis.   Original Report Authenticated By: Charline Bills, M.D.     EKG: Independently reviewed. Sinus tachycardia   Assessment/Plan Present on Admission:   . SIRS (systemic inflammatory response syndrome)  Possibly from infectious diarrhea, urinary tract infection, or pneumonia  We'll admit to intensive care unit for vigorous hydration and monitoring of vital signs and antibiotics  . Gastroenteritis.   Empirically start treatment for C. difficile with oral vancomycin and intravenous Flagyl, pending results of stool evaluation   Acute renal failure  Probably a combination of hypovolemia and NSAID toxicity;  She is already had one and a half liters of normal saline; will check a lactic acid and ABG, will give another one a half liters and start a bicarbonate drip,   . Hypokalemia, due to dehydration   We'll repleat  intravenously, and monitor carefully since this is likely to worsen with normal saline hydration . Hyponatremia, due to dehydration   Replete with normal saline  . Metabolic acidosis  From acute renal failure, and presepsis; will treat with vigorous hydration  . Urinary tract infection  Will start empiric Cipro     Other plans as per orders.  Code Status: FULL CODE  Family Communication:   Plans and prognosis discussed with patient husband and daughter at bedside Disposition Plan: Continue in the ICU  Critical care time: 60 minutes.   Lavender Stanke Nocturnist [facility] Pager [phone]   11/19/2012, 12:04 AM    addendum  At about 12:30 AM, the patient developed worsening respiratory distress while attempting to use the bedpan ; watery diarrhea over bed sheets; no sample was collected; patient required a100% nonrebreathing mask to maintain O2 sats.   saturation.  Subsequent repeat chest x-ray showed evidence of possible pneumonia, and vascular congestion. Vancomycin and cefepime were added for pneumonia coverage  Patient was subsequently discussed with PCCM and on-call recommended decreasing IV fluids and repeating ABG. Repeat ABG showed worsening acidosis and decreased ability to compensate despite increasing respiratory rate;  by 3:35 AM PCCM advised intubation and transfer to [facility].  Additional critical care time map devoted to managing patient after initial admission including repeated updates family:60 minutes

## 2012-11-19 NOTE — Addendum Note (Signed)
Addendum created 11/19/12 0618 by Laurene Footman, MD   Modules edited: Anesthesia Attestations, Anesthesia Events

## 2012-11-19 NOTE — ED Provider Notes (Signed)
I was called by Dr. Orvan Falconer at 345 to come intubate patient. She was admitted earlier in the evening for possible sepsis. She has been followed by critical care at [facility], who recommended she be intubated prior to transfer to their facility for further treatment of sepsis.  Patient noted to be tachycardic with heart rate 130's. She also is noted the tachypnea with her pulse ox on nonrebreather mask at 91%. Patient examined before and attempted intubation. She was noted to be mallampati class 4.   Pt was preoxygenated by BVM and her pulse ox improved to 95%. She was sedated with etomidate 20 mg IV and SCC 125 mg IV. Once she was paralyzed I attempted to intubate patient. She still had a very stiff mandible and her oralpharynx was very small. Her tongue was very difficult to pass the glide scope laryngoscope behind. She did have a small amount of bleeding. Once I got beyond her tongue, I was never able to accurately visualize her cords. Her mouth was very small and made management of intubation equipment difficult. I also tried to look with a regular laryngoscope without success. At this point attempts at intubation were stopped. And patient was ventilated by bag valve mask. We were able to maintain her oxygen  in the low 90%. We did have to put an oral airway because of obstruction by her tongue with a drop in her pulse ox. Patient had returned to spontaneous respirations within a few minutes. However she remained sedated.  Patient did start waking up and would open her eyes to voice stimulation and was moving her hands. She did not like having the mask on her face and was trying to pull it off. We did switch her over to her nonrebreather mask however her pulse ox dropped quickly into the high 80s percent. A nasal trumpet was inserted however her pulse ox dropped to 81%. At this point we resumed bag valve mask ventilation. Her pulse ox improved to 94%.  Care was turned over to the nurse anesthetist at  435 am. She was informed of the anatomical difficulties that I had observed with my attempts at intubation.    Care at bedside managing ventilation/airway  was 45 minutes.    Devoria Albe, MD, FACEP   Ward Givens, MD 11/19/12 [phone]

## 2012-11-19 NOTE — Progress Notes (Signed)
INITIAL NUTRITION ASSESSMENT  DOCUMENTATION CODES Per approved criteria  -Not Applicable   INTERVENTION: 1. Recommend initiation of nutrition support within 24-48 hours of intubation. If enteral nutrition desired, recommend Osmolite 1.5 via enteral feeding tube at 20 ml/h, advance by 10 ml every 4 hr, to goal of 40 ml/h. Add 60 ml Prostat via tube BID. Add liquid MVI. Goal regimen will provide: 1840 kcal, 120 grams protein, 732 ml free water, and 100% RDIs. 2. RD to continue to follow nutrition care plan  NUTRITION DIAGNOSIS: Inadequate oral intake related to inability to eat as evidenced by NPO status.  Goal: Initiate nutrition support within 24-48 hours of intubation; intake to meet >90% of estimated nutrition needs.  Monitor:  weight trends, lab trends, I/O's, vent settings, initiation of nutrition support  Reason for Assessment: VDRF  67 y.o. female  Admitting Dx: SIRS (systemic inflammatory response syndrome)  ASSESSMENT: Admitted with n/v/d and fever (up to 105) x 1 day. Lethargic. Multiple family members note similar illnesses. Has not seen MD in 40 years.  Admitted to SDU at [facility] initially, however transferred to ICU given SIRS, possibly from infectious diarrhea/UTI/PNA, and need for "vigorous hydration." Also with ARF 2/2 NSAID toxicity and hypovolemia.  Developed respiratory distress and intubated this morning. Transferred to [facility] for possible HD. HD catheter placed at this time.  Patient is currently intubated on ventilator support.  MV: 11.4 Temp:Temp (24hrs), Avg:100.8 F (38.2 C), Min:98.4 F (36.9 C), Max:105.6 F (40.9 C) Pt now afebrile.  Height: Ht Readings from Last 1 Encounters:  11/18/12 5\' 6"  (1.676 m)    Weight: Wt Readings from Last 1 Encounters:  11/19/12 167 lb 1.7 oz (75.8 kg)    Ideal Body Weight: 130 lb  % Ideal Body Weight: 128%  Wt Readings from Last 10 Encounters:  11/19/12 167 lb 1.7 oz (75.8 kg)    Usual Body Weight: n/a  %  Usual Body Weight: n/a  BMI:  Body mass index is 26.98 kg/(m^2). Overweight.  Estimated Nutritional Needs: Kcal: 1788 Protein: 100 - 120 grams Fluid: 1.8 - 2 liters  Skin: intact  Diet Order: NPO  EDUCATION NEEDS: -No education needs identified at this time   Intake/Output Summary (Last 24 hours) at 11/19/12 1440 Last data filed at 11/19/12 1200  Gross per 24 hour  Intake 3856.25 ml  Output    460 ml  Net 3396.25 ml    Last BM: 4/11 - c diff pending  Labs:   Recent Labs Lab 11/18/12 1845 11/18/12 2106 11/19/12 0240 11/19/12 0839  NA 128*  --  128*  --   K 3.3*  --  3.8  --   CL 91*  --  98  --   CO2 17*  --  13*  --   BUN 56*  --  54*  --   CREATININE 4.32*  --  3.99* 3.86*  CALCIUM 7.9*  --  6.6*  --   MG  --   --  1.3*  --   PHOS  --  3.9  --   --   GLUCOSE 110*  --  143*  --     CBG (last 3)   Recent Labs  11/19/12 0748 11/19/12 1136  GLUCAP 113* 86    Scheduled Meds: . acidophilus  2 capsule Oral Daily  . antiseptic oral rinse  15 mL Mouth Rinse QID  . chlorhexidine  15 mL Mouth Rinse BID  . famotidine  20 mg Per Tube Daily  .  heparin  5,000 Units Subcutaneous Q8H  . insulin aspart  0-9 Units Subcutaneous Q4H  . lidocaine (cardiac) 100 mg/67ml      . propofol      . rocuronium      . sodium chloride  3 mL Intravenous Q12H    Continuous Infusions: . fentaNYL infusion INTRAVENOUS    .  sodium bicarbonate  infusion 1000 mL 125 mL/hr at 11/19/12 1112    History reviewed. No pertinent past medical history.  History reviewed. No pertinent past surgical history.  Jarold Motto MS, RD, LDN Pager: [phone] After-hours pager: [phone]

## 2012-11-19 NOTE — Consult Note (Addendum)
Requesting Physician:  Dr. Delton Coombes  Reason for Consult:  Renal failure, metabolic acidosis HPI: The patient is a 67 y.o. year-old with limited medical history as she does not like to see doctors, who was transferred from [facility] where she presented with a diarrheal illness (with other ill family members), with associated renal failure (creatinine of 4.32 on presentation), evolving anion gap metabolic acidosis and respiratory failure requiring intubation.  We are asked to see because of oliguric renal failure and acidosis.  A hemodialysis catheter was placed earlier today for possible renal replacement therapy.  Patient developed hypotension after placement of the catheter.  She has been started on isotonic sodium bicarbonate for fluid rescuscitation.  Of note she was using NSAIDS according to her family PTA but when patient asks indicates by nod she was not.  Baseline renal function is not known.  Received 2 liters of saline prior to transfer with 400 cc UOP 60 cc emptied from foley on arrival  Gram stain from [facility] from this AM + GPC in chains  Creatinine, Ser  Date/Time Value Range Status  11/19/2012  8:39 AM 3.86* 0.50 - 1.10 mg/dL Final  1/61/0960  4:54 AM 3.99* 0.50 - 1.10 mg/dL Final  0/98/1191  4:78 PM 4.32* 0.50 - 1.10 mg/dL Final    Past Medical History: History reviewed. No pertinent past medical history.  Past Surgical History: History reviewed. No pertinent past surgical history.  Family History:  Family History  Problem Relation Age of Onset  . Asthma Mother   . Heart disease Father    Social History:  Allergies: No Known Allergies  Home medications: Prior to Admission medications   Medication Sig Start Date End Date Taking? Authorizing Provider  ibuprofen (ADVIL,MOTRIN) 200 MG tablet Take 200 mg by mouth daily as needed for pain.   Yes Historical Provider, MD  loratadine (CLARITIN) 10 MG tablet Take 10 mg by mouth every other day.   Yes Historical Provider, MD    Inpatient  medications: . acidophilus  2 capsule Oral Daily  . antiseptic oral rinse  15 mL Mouth Rinse QID  . chlorhexidine  15 mL Mouth Rinse BID  . famotidine  20 mg Per Tube Daily  . heparin  5,000 Units Subcutaneous Q8H  . insulin aspart  0-9 Units Subcutaneous Q4H  . lidocaine (cardiac) 100 mg/35ml      . propofol      . rocuronium      . sodium chloride  3 mL Intravenous Q12H   . fentaNYL infusion INTRAVENOUS    .  sodium bicarbonate  infusion 1000 mL 125 mL/hr at 11/19/12 1112   Review of Systems According to history from CCM, family members indicated diarrhea, nausea, vomiting, headache, lethargy and confusion Although patient is intubated and complete ROS no possible,  she is able to yes/no to the following: Diarrhea 2 days watery no abdominal pain Uncertain if fever No to chest pain, shortness of breath, leg swelling, UTI symptoms, dark urine   Physical Exam:  Blood pressure 100/63, pulse 100, temperature 98.4 F (36.9 C), temperature source Oral, resp. rate 22, height 5\' 6"  (1.676 m), weight 75.8 kg (167 lb 1.7 oz), SpO2 97.00%. I/O last 3 completed shifts: In: 2146.3 [I.V.:396.3; IV Piggyback:1750] Out: 400 [Urine:400] Total I/O In: 1710 [I.V.:1710] Out: 60 [Urine:60]  Additional urine in foley Gen: Intubated but alert and can nod to questions Skin: no rash, cyanosis Neck: no JVD, no bruits or LAN Right IJ HD cath in place Chest: Anteriorly clear  Heart: regular, no rub or gallop Abdomen: soft, hypoactive bowel sounds Ext: No edema Neuro: alert, intubated Heme/Lymph: no bruising Foley with dark yellow urine Black watery stool in stool bag   Labs: Basic Metabolic Panel:  Recent Labs Lab 11/18/12 1845 11/18/12 2106 11/19/12 0240 11/19/12 0839  NA 128*  --  128*  --   K 3.3*  --  3.8  --   CL 91*  --  98  --   CO2 17*  --  13*  --   GLUCOSE 110*  --  143*  --   BUN 56*  --  54*  --   CREATININE 4.32*  --  3.99* 3.86*  CALCIUM 7.9*  --  6.6*  --   PHOS  --  3.9   --   --   Anion gap 17 Liver Function Tests:  Recent Labs Lab 11/18/12 1845 11/19/12 0240  AST 25 28  ALT 35 33  ALKPHOS 149* 119*  BILITOT 1.5* 1.1  PROT 7.1 6.2  ALBUMIN 2.9* 2.3*  CBC:  Recent Labs Lab 11/18/12 1845 11/19/12 0240 11/19/12 0839  WBC 28.1* 33.1* 27.1*  NEUTROABS 26.7*  --   --   HGB 13.5 12.4 11.4*  HCT 39.6 36.0 32.5*  MCV 86.7 86.3 84.4  PLT 240 207 177    Recent Labs Lab 11/18/12 2254  CKTOTAL 59   CBG:  Recent Labs Lab 11/19/12 0748 11/19/12 1136  GLUCAP 113* 86  Results for Deanna, Becker (MRN [id number]) as of 11/19/2012 14:29  Ref. Range 11/18/2012 19:06 11/19/2012 07:33  Color, Urine Latest Range: YELLOW  YELLOW YELLOW  APPearance Latest Range: CLEAR  CLEAR CLOUDY (A)  Specific Gravity, Urine Latest Range: 1.005-1.030  >1.030 (H) 1.016  pH Latest Range: 5.0-8.0  5.5 5.0  Glucose Latest Range: NEGATIVE mg/dL NEGATIVE NEGATIVE  Bilirubin Urine Latest Range: NEGATIVE  NEGATIVE NEGATIVE  Ketones, ur Latest Range: NEGATIVE mg/dL NEGATIVE NEGATIVE  Protein Latest Range: NEGATIVE mg/dL NEGATIVE 130 (A)  Urobilinogen, UA Latest Range: 0.0-1.0 mg/dL 0.2 0.2  Nitrite Latest Range: NEGATIVE  NEGATIVE NEGATIVE  Leukocytes, UA Latest Range: NEGATIVE  TRACE (A) MODERATE (A)  Hgb urine dipstick Latest Range: NEGATIVE  NEGATIVE LARGE (A)  Urine-Other No range found  AMORPHOUS URATES/PHOSPHATES  WBC, UA Latest Range: <3 WBC/hpf 21-50 11-20  RBC / HPF Latest Range: <3 RBC/hpf 0-2 3-6  Squamous Epithelial / LPF Latest Range: RARE  RARE RARE  Bacteria, UA Latest Range: RARE  MANY (A) MANY (A)  Casts Latest Range: NEGATIVE  HYALINE CASTS (A) GRANULAR CAST (A)   Urine protein:creatinine 1.18 grams Blood culture gramstain GPC in chains  ABG    Component Value Date/Time   PHART 7.283* 11/19/2012 1240   PCO2ART 21.8* 11/19/2012 1240   PO2ART 92.0 11/19/2012 1240   HCO3 10.3* 11/19/2012 1240   TCO2 11 11/19/2012 1240   ACIDBASEDEF 14.0* 11/19/2012 1240    O2SAT 96.0 11/19/2012 1240   Results for Deanna, Becker (MRN [id number]) as of 11/19/2012 15:59  Ref. Range 11/19/2012 02:40  Magnesium Latest Range: 1.5-2.5 mg/dL 1.3 (L)   Xrays/Other Studies: Dg Chest 1 View  11/19/2012  *RADIOLOGY REPORT*  Clinical Data: Acute respiratory failure.  CHEST - 1 VIEW  Comparison: Chest radiograph performed 11/18/2012  Findings: Worsening left basilar airspace opacification is concerning for pneumonia.  Underlying mild vascular congestion is seen.  A small left pleural effusion is likely present.  No pneumothorax is seen.  The mediastinal silhouette is borderline normal in size.  No acute osseous anomalies are identified.  IMPRESSION: Worsening left basilar airspace opacification is concerning for pneumonia; likely small left pleural effusion noted.  Underlying mild vascular congestion seen.   Original Report Authenticated By: Tonia Ghent, M.D.    Dg Chest Port 1 View  11/19/2012  *RADIOLOGY REPORT*  Clinical Data: Hemodialysis catheter insertion.  PORTABLE CHEST - 1 VIEW  Comparison: 11/19/2012.  Findings: Endotracheal tube is in satisfactory position. Nasogastric tube is followed into the stomach with the tip projecting beyond the inferior margin of the image.  Right IJ dialysis catheter tip projects over the SVC.  Heart size is grossly stable and accentuated by AP semi upright technique and low lung volumes. Mild bibasilar volume loss.  There may be a tiny left pleural effusion.  No pneumothorax.  IMPRESSION:  1.  Satisfactory right IJ dialysis catheter placement without pneumothorax. 2.  Low lung volumes with bibasilar atelectasis. 3.  Question tiny left pleural effusion.   Original Report Authenticated By: Leanna Battles, M.D.    Dg Chest Portable 1 View  11/18/2012  *RADIOLOGY REPORT*  Clinical Data: Altered mental status, vomiting  PORTABLE CHEST - 1 VIEW  Comparison: None.  Findings: Low lung volumes with vascular crowding.  Mild bibasilar opacities, likely  atelectasis.  No pleural effusion or pneumothorax.  The heart is normal in size.  IMPRESSION: Low lung volumes with bibasilar atelectasis.   Original Report Authenticated By: Charline Bills, M.D.    Dg Chest Port 1v Same Day  11/19/2012  *RADIOLOGY REPORT*  Clinical Data: Endotracheal tube and enteric tube placement.  PORTABLE CHEST - 1 VIEW SAME DAY  Comparison: Chest radiograph performed earlier today at 01:20 a.m.  Findings: The patient's endotracheal tube is seen ending 2-3 cm above the carina.  An enteric tube is noted extending below the diaphragm.  The lungs are hypoexpanded.  Patchy left basilar and right perihilar airspace opacification appears slightly worsened at the right lung apex, concerning for multifocal pneumonia.  The previously noted left pleural effusion is less well characterized. No pneumothorax is seen.  Mild vascular congestion is noted.  The cardiomediastinal silhouette is borderline normal in size.  No acute osseous abnormalities are seen.  IMPRESSION:  1.  Endotracheal tube seen ending 2-3 cm above the carina. 2.  Enteric tube noted extending below the diaphragm. 3.  Lungs hypoexpanded.  Patchy bilateral airspace opacification appears slightly worsened at the right lung apex, concerning for multifocal pneumonia.  Mild vascular congestion seen.   Original Report Authenticated By: Tonia Ghent, M.D.     Impression/Plan 67 year old WF with no PMH admitted with renal failure and an anion gap metabolic acidosis in the setting of a severe diarrheal illness of at least 2 days duration, with associated volume depletion, "possible" NSAID use (family indicated yes, pt indicates no), possible UTI, and gram positive bacteremia .  AKI (baseline not known) Starting to make urine with volume rescuscitation Creatinine has improved slightly since presentation Continue volume rescusitation for now Watch potassium (has already required some replacement and may drop further with correction of  acidosis Mag low - replace with 2 grams and recheck. If renal function improves then no need for big workup WOULD however send SPEP as pt has elevated TP with respect to albumin and would obtain renal US and repeat UA after treatment of what appears to be infected urine Dialysis may not be needed  Metabolic acidosis CO2 lower after saline volume expansion but gap slightlly lower Continue isotonic sodium bicarb Follow labs  VDRF per CCM  Sepsis/hypotension/probable volume depletion in setting of severe diarrhea/gastroenteritis/gm+ bacteremia/possible UTI  per CCM Vanco/cefipime per pharm  Camille Bal,  MD [facility] [phone] pager 11/19/2012, 2:35 PM

## 2012-11-19 NOTE — Progress Notes (Signed)
CRITICAL VALUE ALERT  Critical value received:  Ca 6.3  Date of notification:  11/19/2012  Time of notification:  20:40  Critical value read back:yes  Nurse who received alert:  Loma Sender RN   MD notified (1st page):  Mignon Pine  Time of first page:  20:40  MD notified (2nd page):  Time of second page:  Responding MD:  Sood-Elink  Time MD responded:  20:40

## 2012-11-19 NOTE — Anesthesia Procedure Notes (Signed)
Procedure Name: Intubation Date/Time: 11/19/2012 4:30 AM Performed by: Glynn Octave E Oxygen Delivery Method: Simple face mask Preoxygenation: Pre-oxygenation with 100% oxygen Laryngoscope Size: Mac and 3 Grade View: Grade II Tube type: Oral Tube size: 7.0 mm Airway Equipment and Method: Video-laryngoscopy and Stylet Placement Confirmation: ETT inserted through vocal cords under direct vision,  CO2 detector and breath sounds checked- equal and bilateral Secured at: 22 cm Tube secured with: Tape Dental Injury: Teeth and Oropharynx as per pre-operative assessment and Bloody posterior oropharynx  Difficulty Due To: Difficulty was anticipated, Difficult Airway- due to anterior larynx and Difficult Airway-  due to edematous airway Future Recommendations: Recommend- induction with short-acting agent, and alternative techniques readily available Comments: Called into intubate this patient after failed attempt by ED doctor.  Poor view with mac 3, glidescope utililized, post. Oropharynx bloody and edematous.  Visualized post. Arytenoids, but unable to pass ETT.  Dr. Jayme Cloud called in to assist.  VSS, hand, mask assisted ventilation with sats maintained 88-92.  Dr. Jayme Cloud used glidescope, cords visualized and #7 ETT passed with difficulty.  Stylet utilized with exaggerated curve in order to pass ETT through cords.

## 2012-11-19 NOTE — Progress Notes (Signed)
eLink Physician-Brief Progress Note Patient Name: Deanna Becker DOB: 01-26-1946 MRN: [id number]  Date of Service  11/19/2012   HPI/Events of Note  Call from [facility], Dr. Orvan Falconer, requesting assistance with patient admitted with N/V/D and ARI secondary to NSAIDs.  Patient now with increased WOB with RR to the high 20s/30s and AMS.  ABG had been compensated earlier but now with metabolic acidosis with pH of 7.25 and pCO2 of 32 which is up from 25 earlier.    eICU Interventions  Plan: Intubate with propofol sedation as BP stable Transfer to PCCM service at [facility]   Intervention Category Major Interventions: Respiratory failure - evaluation and management;Acid-Base disturbance - evaluation and management  DETERDING,ELIZABETH 11/19/2012, 3:48 AM

## 2012-11-19 NOTE — Progress Notes (Signed)
Pt sent to [facility] [address] via [facility] at approximately 0620. All belongings were transported with the patient, and her spouse was aware of the transfer to [facility]. Pt was intubated prior to transfer. Report called to nurse Lillia Abed.

## 2012-11-19 NOTE — Procedures (Addendum)
Hemodialysis Insertion Procedure Note Deanna Becker [id number] 15-Mar-1946  Procedure: Insertion of Hemodialysis Catheter Type: 3 port  Indications: Hemodialysis   Procedure Details Consent: Risks of procedure as well as the alternatives and risks of each were explained to the (patient/caregiver).  Consent for procedure obtained. Time Out: Verified patient identification, verified procedure, site/side was marked, verified correct patient position, special equipment/implants available, medications/allergies/relevent history reviewed, required imaging and test results available.  Performed  Maximum sterile technique was used including antiseptics, cap, gloves, gown, hand hygiene, mask and sheet. Skin prep: Chlorhexidine; local anesthetic administered A antimicrobial bonded/coated triple lumen catheter was placed in the right internal jugular vein using the Seldinger technique. Ultrasound guidance used.yes Catheter placed to 16 cm. Blood aspirated via all 3 ports and then flushed x 3. Line sutured x 2 and dressing applied.  Evaluation Blood flow good Complications: No apparent complications Patient did tolerate procedure well. Chest X-ray ordered to verify placement.  CXR: pending.  Brett Canales Minor ACNP [facility] PCCM Pager [phone] till 3 pm If no answer page [phone] 11/19/2012, 9:33 AM  cat  Levy Pupa, MD, PhD 11/19/2012, 9:56 AM [facility] [phone] or if no answer [phone]

## 2012-11-19 NOTE — Consult Note (Signed)
ANTIBIOTIC CONSULT NOTE-Preliminary  Pharmacy Consult for Vancomycin and Cefepime Indication: Pneumonia  No Known Allergies  Patient Measurements: Height: 5\' 6"  (167.6 cm) Weight: 167 lb 1.7 oz (75.8 kg) IBW/kg (Calculated) : 59.3   Vital Signs: Temp: 100.3 F (37.9 C) (04/11 0000) Temp src: Oral (04/11 0000) BP: 149/65 mmHg (04/11 0000) Pulse Rate: 134 (04/11 0000)  Labs:  Recent Labs  11/18/12 1845  WBC 28.1*  HGB 13.5  PLT 240  CREATININE 4.32*    Estimated Creatinine Clearance: 13.5 ml/min (by C-G formula based on Cr of 4.32).  No results found for this basename: VANCOTROUGH, Leodis Binet, VANCORANDOM, GENTTROUGH, GENTPEAK, GENTRANDOM, TOBRATROUGH, TOBRAPEAK, TOBRARND, AMIKACINPEAK, AMIKACINTROU, AMIKACIN,  in the last 72 hours   Microbiology: Recent Results (from the past 720 hour(s))  MRSA PCR SCREENING     Status: None   Collection Time    11/18/12 10:30 PM      Result Value Range Status   MRSA by PCR NEGATIVE  NEGATIVE Final   Comment:            The GeneXpert MRSA Assay (FDA     approved for NASAL specimens     only), is one component of a     comprehensive MRSA colonization     surveillance program. It is not     intended to diagnose MRSA     infection nor to guide or     monitor treatment for     MRSA infections.    Medical History: History reviewed. No pertinent past medical history.  Medications:  Ceftriaxone 1 Gm IV given in the ED Ciprofloxacin 400 mg IV for one dose Metronidazole 500 mg IV every 8 hours Levofloxacin IV to be started 4/12 Vancomycin 250 mg orally every 6 hours   Assessment: 67 yo female with 1 day history of fever, vomiting and diarrhea; now with dehydration and acute renal failure.  WBCs elevated to 28,000. Tmax 105.6. Labs indicate UTI. Chest x-ray evidence of pneumonia. She will be started on antibiotics empirically.   Goal of Therapy:  Vancomycin troughs 15-20 mcg/ml Eradication of infection   Plan:   Preliminary review of pertinent patient information completed.  Protocol will be initiated with one-time doses of Vancomycin 1 Gm IV and Cefepime 1 Gm IV.  Jeani Hawking clinical pharmacist will complete review during morning rounds to assess patient and finalize treatment regimen.  Arelia Sneddon, [facility] 11/19/2012,2:29 AM

## 2012-11-20 ENCOUNTER — Inpatient Hospital Stay (HOSPITAL_COMMUNITY): Payer: Medicare Other

## 2012-11-20 DIAGNOSIS — I369 Nonrheumatic tricuspid valve disorder, unspecified: Secondary | ICD-10-CM

## 2012-11-20 DIAGNOSIS — J189 Pneumonia, unspecified organism: Secondary | ICD-10-CM

## 2012-11-20 DIAGNOSIS — R6510 Systemic inflammatory response syndrome (SIRS) of non-infectious origin without acute organ dysfunction: Secondary | ICD-10-CM

## 2012-11-20 LAB — PROCALCITONIN: Procalcitonin: 86.99 ng/mL

## 2012-11-20 LAB — BLOOD GAS, ARTERIAL
Acid-base deficit: 5 mmol/L — ABNORMAL HIGH (ref 0.0–2.0)
Bicarbonate: 18.1 mEq/L — ABNORMAL LOW (ref 20.0–24.0)
FIO2: 0.3 %
O2 Saturation: 97.1 %
TCO2: 18.8 mmol/L (ref 0–100)
pO2, Arterial: 84.6 mmHg (ref 80.0–100.0)

## 2012-11-20 LAB — MAGNESIUM: Magnesium: 2.4 mg/dL (ref 1.5–2.5)

## 2012-11-20 LAB — BASIC METABOLIC PANEL
BUN: 53 mg/dL — ABNORMAL HIGH (ref 6–23)
BUN: 48 mg/dL — ABNORMAL HIGH (ref 6–23)
CO2: 21 mEq/L (ref 19–32)
Calcium: 7 mg/dL — ABNORMAL LOW (ref 8.4–10.5)
Chloride: 104 mEq/L (ref 96–112)
Creatinine, Ser: 3.77 mg/dL — ABNORMAL HIGH (ref 0.50–1.10)
GFR calc Af Amer: 17 mL/min — ABNORMAL LOW (ref >90)
Glucose, Bld: 105 mg/dL — ABNORMAL HIGH (ref 70–99)
Potassium: 3 mEq/L — ABNORMAL LOW (ref 3.5–5.1)
Sodium: 142 mEq/L (ref 135–145)

## 2012-11-20 LAB — LACTIC ACID, PLASMA: Lactic Acid, Venous: 1.4 mmol/L (ref 0.5–2.2)

## 2012-11-20 LAB — GLUCOSE, CAPILLARY
Glucose-Capillary: 105 mg/dL — ABNORMAL HIGH (ref 70–99)
Glucose-Capillary: 112 mg/dL — ABNORMAL HIGH (ref 70–99)
Glucose-Capillary: 116 mg/dL — ABNORMAL HIGH (ref 70–99)

## 2012-11-20 LAB — URINE CULTURE

## 2012-11-20 LAB — CBC
HCT: 30.2 % — ABNORMAL LOW (ref 36.0–46.0)
MCH: 29.1 pg (ref 26.0–34.0)
MCV: 82.1 fL (ref 78.0–100.0)
Platelets: 157 10*3/uL (ref 150–400)
RBC: 3.68 MIL/uL — ABNORMAL LOW (ref 3.87–5.11)

## 2012-11-20 LAB — CULTURE, BLOOD (ROUTINE X 2)

## 2012-11-20 LAB — PHOSPHORUS: Phosphorus: 3.9 mg/dL (ref 2.3–4.6)

## 2012-11-20 MED ORDER — SODIUM CHLORIDE 0.9 % IV SOLN
INTRAVENOUS | Status: DC
  Administered 2012-11-20 – 2012-11-22 (×5): via INTRAVENOUS

## 2012-11-20 MED ORDER — POTASSIUM CHLORIDE 10 MEQ/50ML IV SOLN
10.0000 meq | INTRAVENOUS | Status: AC
  Administered 2012-11-20 (×4): 10 meq via INTRAVENOUS
  Filled 2012-11-20: qty 200

## 2012-11-20 MED ORDER — CLINDAMYCIN PHOSPHATE 900 MG/50ML IV SOLN
900.0000 mg | Freq: Three times a day (TID) | INTRAVENOUS | Status: DC
  Administered 2012-11-20 – 2012-11-22 (×6): 900 mg via INTRAVENOUS
  Filled 2012-11-20 (×8): qty 50

## 2012-11-20 MED ORDER — ACETAMINOPHEN 325 MG PO TABS: 650.0000 mg | ORAL_TABLET | Freq: Four times a day (QID) | ORAL | Status: DC | PRN

## 2012-11-20 MED ORDER — FENTANYL CITRATE 0.05 MG/ML IJ SOLN: 25.0000 ug | INTRAMUSCULAR | Status: DC | PRN

## 2012-11-20 MED ORDER — FAMOTIDINE 20 MG PO TABS
20.0000 mg | ORAL_TABLET | Freq: Every day | ORAL | Status: DC
  Administered 2012-11-20 – 2012-11-23 (×4): 20 mg via ORAL
  Filled 2012-11-20 (×5): qty 1

## 2012-11-20 MED ORDER — POTASSIUM CHLORIDE CRYS ER 20 MEQ PO TBCR
20.0000 meq | EXTENDED_RELEASE_TABLET | Freq: Once | ORAL | Status: AC
  Administered 2012-11-20: 20 meq via ORAL

## 2012-11-20 MED ORDER — POTASSIUM CHLORIDE CRYS ER 20 MEQ PO TBCR
EXTENDED_RELEASE_TABLET | ORAL | Status: AC
  Filled 2012-11-20: qty 1

## 2012-11-20 MED ORDER — BIOTENE DRY MOUTH MT LIQD
15.0000 mL | Freq: Two times a day (BID) | OROMUCOSAL | Status: DC
  Administered 2012-11-21 – 2012-11-24 (×7): 15 mL via OROMUCOSAL

## 2012-11-20 NOTE — Progress Notes (Signed)
[facility] Rounding Note  Impression/Recommendations  67 year old WF with no PMH admitted with renal failure and an anion gap metabolic acidosis in the setting of a severe diarrheal illness of at least 2 days duration, with associated volume depletion, "possible" NSAID use (family indicated yes, pt indicates no), possible UTI, and gram positive bacteremia .   1. Acute kidney injury (baseline not known)  Urine outpt picking up with volume rescuscitation  Creatinine has improved some since presentation  Continue volume rescusitation for now  Watch potassium (requiring intermittent replacement) Mag normalized post repletion If renal function improves then no need for big workup  However sent SPEP as pt has elevated TP with respect to albumin Renal US report to be amended (right kidney is not 30 cm...) No hydro seen Will need repeat UA after treatment of what appears to be infected urine  Dialysis likely will not  be needed  Will follow for another day and if improvement continues will defer further management to CCM  2. Metabolic acidosis  Gap closing D/C sodium bicarb Follow labs   3. VDRF  For extubation   4. Sepsis/hypotension/probable volume depletion in setting of severe diarrhea/gastroenteritis/gm+ bacteremia/possible UTI  per CCM BC's GPC chains UC pending  Vanco/cefipime per pharm  Subjective: Awake and alert Responds appropriately Wants tube out  Objective Vital signs in last 24 hours: Filed Vitals:   11/20/12 0411 11/20/12 0500 11/20/12 0600 11/20/12 0747  BP:  123/75 107/81   Pulse:  105 102   Temp: 98.6 F (37 C)   98.3 F (36.8 C)  TempSrc: Oral   Oral  Resp:  22 23   Height:      Weight:      SpO2:  97% 98%    Weight change: 30.36 kg (66 lb 14.9 oz)  Intake/Output Summary (Last 24 hours) at 11/20/12 0758 Last data filed at 11/20/12 0600  Gross per 24 hour  Intake   4500 ml  Output   2445 ml  Net   2055 ml  I/O last 3 completed  shifts: In: 6666.3 [I.V.:4816.3; IV Piggyback:1850] Out: 2845 [Urine:1445; Stool:1400]   Gen: Intubated but awake and alert and can nod to questions  Skin: no rash, cyanosis  Neck: Right IJ HD cath in place  Chest: Anteriorly clear  Heart: regular, no rub or gallop  Abdomen: soft, hypoactive bowel sounds  Ext: No edema  Foley with clear yellow urine  Black watery stool in stool bag   Labs: Basic Metabolic Panel:  Recent Labs Lab 11/18/12 1845 11/18/12 2106 11/19/12 0240 11/19/12 0839 11/19/12 1800 11/20/12 0433  NA 128*  --  128*  --  132* 136  K 3.3*  --  3.8  --  3.0* 3.0*  CL 91*  --  98  --  102 101  CO2 17*  --  13*  --  16* 21  GLUCOSE 110*  --  143*  --  119* 130*  BUN 56*  --  54*  --  55* 53*  CREATININE 4.32*  --  3.99* 3.86* 3.97* 3.77*  CALCIUM 7.9*  --  6.6*  --  6.3* 7.0*  PHOS  --  3.9  --   --  4.6 3.9   Liver Function Tests:  Recent Labs Lab 11/18/12 1845 11/19/12 0240 11/19/12 1800  AST 25 28  --   ALT 35 33  --   ALKPHOS 149* 119*  --   BILITOT 1.5* 1.1  --  PROT 7.1 6.2  --   ALBUMIN 2.9* 2.3* 2.0*   CBC:  Recent Labs Lab 11/18/12 1845 11/19/12 0240 11/19/12 0839 11/20/12 0433  WBC 28.1* 33.1* 27.1* 20.2*  NEUTROABS 26.7*  --   --   --   HGB 13.5 12.4 11.4* 10.7*  HCT 39.6 36.0 32.5* 30.2*  MCV 86.7 86.3 84.4 82.1  PLT 240 207 177 157   Recent Labs Lab 11/18/12 2254  CKTOTAL 59   CBG:  Recent Labs Lab 11/19/12 1510 11/19/12 1939 11/19/12 2354 11/20/12 0410 11/20/12 0738  GLUCAP 109* 107* 116* 122* 120*  Results for Deanna, Becker (MRN [id number]) as of 11/19/2012 14:29   Ref. Range  11/18/2012 19:06  11/19/2012 07:33   Color, Urine  Latest Range: YELLOW  YELLOW  YELLOW   APPearance  Latest Range: CLEAR  CLEAR  CLOUDY (A)   Specific Gravity, Urine  Latest Range: 1.005-1.030  >1.030 (H)  1.016   pH  Latest Range: 5.0-8.0  5.5  5.0   Glucose  Latest Range: NEGATIVE mg/dL  NEGATIVE  NEGATIVE   Bilirubin Urine  Latest  Range: NEGATIVE  NEGATIVE  NEGATIVE   Ketones, ur  Latest Range: NEGATIVE mg/dL  NEGATIVE  NEGATIVE   Protein  Latest Range: NEGATIVE mg/dL  NEGATIVE  409 (A)   Urobilinogen, UA  Latest Range: 0.0-1.0 mg/dL  0.2  0.2   Nitrite  Latest Range: NEGATIVE  NEGATIVE  NEGATIVE   Leukocytes, UA  Latest Range: NEGATIVE  TRACE (A)  MODERATE (A)   Hgb urine dipstick  Latest Range: NEGATIVE  NEGATIVE  LARGE (A)   Urine-Other  No range found   AMORPHOUS URATES/PHOSPHATES   WBC, UA  Latest Range: <3 WBC/hpf  21-50  11-20   RBC / HPF  Latest Range: <3 RBC/hpf  0-2  3-6   Squamous Epithelial / LPF  Latest Range: RARE  RARE  RARE   Bacteria, UA  Latest Range: RARE  MANY (A)  MANY (A)   Casts  Latest Range: NEGATIVE  HYALINE CASTS (A)  GRANULAR CAST (A)   Results for Deanna, Becker (MRN [id number]) as of 11/20/2012 08:01  Ref. Range 11/19/2012 02:40 11/20/2012 04:33  Magnesium Latest Range: 1.5-2.5 mg/dL 1.3 (L) 2.4   ABG    Component Value Date/Time   PHART 7.467* 11/20/2012 0300   PCO2ART 25.3* 11/20/2012 0300   PO2ART 84.6 11/20/2012 0300   HCO3 18.1* 11/20/2012 0300   TCO2 18.8 11/20/2012 0300   ACIDBASEDEF 5.0* 11/20/2012 0300   O2SAT 97.1 11/20/2012 0300   Urine protein:creatinine 1.18 grams  Blood culture gramstain GPC in chains   Studies/Results: Dg Chest 1 View  11/19/2012  *RADIOLOGY REPORT*  Clinical Data: Acute respiratory failure.  CHEST - 1 VIEW  Comparison: Chest radiograph performed 11/18/2012  Findings: Worsening left basilar airspace opacification is concerning for pneumonia.  Underlying mild vascular congestion is seen.  A small left pleural effusion is likely present.  No pneumothorax is seen.  The mediastinal silhouette is borderline normal in size.  No acute osseous anomalies are identified.  IMPRESSION: Worsening left basilar airspace opacification is concerning for pneumonia; likely small left pleural effusion noted.  Underlying mild vascular congestion seen.   Original Report  Authenticated By: Tonia Ghent, M.D.    US Renal Port  11/19/2012  *RADIOLOGY REPORT*  Clinical Data: Acute renal failure  RENAL/URINARY TRACT ULTRASOUND COMPLETE  Comparison:  None.  Findings:  Right Kidney:  Measures 30.1 cm.  No mass or hydronephrosis.  Left  Kidney:  Measures 13.9 cm.  No mass or hydronephrosis.  Bladder:  Decompressed by indwelling Foley catheter.  IMPRESSION: No hydronephrosis.  Bladder is decompressed by indwelling Foley catheter.   Original Report Authenticated By: Charline Bills, M.D.    Dg Chest Port 1 View  11/20/2012  *RADIOLOGY REPORT*  Clinical Data: Acute respiratory failure on ventilator.  PORTABLE CHEST - 1 VIEW  Comparison: 11/19/2012  Findings: Low lung volumes again noted.  Left lower lobe opacity shows no significant change.  No definite pleural effusion seen. Heart size is stable.  Support apparatus remains in appropriate position.  IMPRESSION: Low lung volumes and left lower lobe opacity, without significant change.   Original Report Authenticated By: Myles Rosenthal, M.D.    Dg Chest Port 1 View  11/19/2012  *RADIOLOGY REPORT*  Clinical Data: Hemodialysis catheter insertion.  PORTABLE CHEST - 1 VIEW  Comparison: 11/19/2012.  Findings: Endotracheal tube is in satisfactory position. Nasogastric tube is followed into the stomach with the tip projecting beyond the inferior margin of the image.  Right IJ dialysis catheter tip projects over the SVC.  Heart size is grossly stable and accentuated by AP semi upright technique and low lung volumes. Mild bibasilar volume loss.  There may be a tiny left pleural effusion.  No pneumothorax.  IMPRESSION:  1.  Satisfactory right IJ dialysis catheter placement without pneumothorax. 2.  Low lung volumes with bibasilar atelectasis. 3.  Question tiny left pleural effusion.   Original Report Authenticated By: Leanna Battles, M.D.    Dg Chest Portable 1 View  11/18/2012  *RADIOLOGY REPORT*  Clinical Data: Altered mental status, vomiting   PORTABLE CHEST - 1 VIEW  Comparison: None.  Findings: Low lung volumes with vascular crowding.  Mild bibasilar opacities, likely atelectasis.  No pleural effusion or pneumothorax.  The heart is normal in size.  IMPRESSION: Low lung volumes with bibasilar atelectasis.   Original Report Authenticated By: Charline Bills, M.D.    Dg Chest Port 1v Same Day  11/19/2012  *RADIOLOGY REPORT*  Clinical Data: Endotracheal tube and enteric tube placement.  PORTABLE CHEST - 1 VIEW SAME DAY  Comparison: Chest radiograph performed earlier today at 01:20 a.m.  Findings: The patient's endotracheal tube is seen ending 2-3 cm above the carina.  An enteric tube is noted extending below the diaphragm.  The lungs are hypoexpanded.  Patchy left basilar and right perihilar airspace opacification appears slightly worsened at the right lung apex, concerning for multifocal pneumonia.  The previously noted left pleural effusion is less well characterized. No pneumothorax is seen.  Mild vascular congestion is noted.  The cardiomediastinal silhouette is borderline normal in size.  No acute osseous abnormalities are seen.  IMPRESSION:  1.  Endotracheal tube seen ending 2-3 cm above the carina. 2.  Enteric tube noted extending below the diaphragm. 3.  Lungs hypoexpanded.  Patchy bilateral airspace opacification appears slightly worsened at the right lung apex, concerning for multifocal pneumonia.  Mild vascular congestion seen.   Original Report Authenticated By: Tonia Ghent, M.D.    Medications: . fentaNYL infusion INTRAVENOUS    .  sodium bicarbonate  infusion 1000 mL 125 mL/hr at 11/19/12 1957   . acidophilus  2 capsule Oral Daily  . antiseptic oral rinse  15 mL Mouth Rinse QID  . ceFEPime (MAXIPIME) IV  1 g Intravenous Q24H  . chlorhexidine  15 mL Mouth Rinse BID  . famotidine  20 mg Per Tube Daily  . heparin  5,000 Units Subcutaneous Q8H  .  insulin aspart  0-9 Units Subcutaneous Q4H  . potassium chloride  10 mEq  Intravenous Q1 Hr x 4  . sodium chloride  3 mL Intravenous Q12H  . [START ON 11/21/2012] vancomycin  1,000 mg Intravenous Q48H    I  have reviewed scheduled and prn medications.  Camille Bal, MD [facility] [phone] pager 11/20/2012, 7:58 AM

## 2012-11-20 NOTE — Progress Notes (Signed)
eLink Physician-Brief Progress Note Patient Name: Deanna Becker DOB: 1946-01-26 MRN: [id number]  Date of Service  11/20/2012   HPI/Events of Note   Hypokalemia in the setting of AKI and bicarb gtt  eICU Interventions  Plan: 40 mEq KCL replacement via CVL   Intervention Category Intermediate Interventions: Electrolyte abnormality - evaluation and management  [name] 11/20/2012, 6:11 AM

## 2012-11-20 NOTE — H&P (Signed)
PULMONARY  / CRITICAL CARE MEDICINE  Name: Deanna Becker MRN: [id number] DOB: January 23, 1946    ADMISSION DATE:  11/18/2012  REFERRING MD :  Guy Franco [facility]  CHIEF COMPLAINT:  AMS, metabolic acidosis, VDRF  BRIEF PATIENT DESCRIPTION: 67 yo woman, little PMH, presented to [facility] after several days diarrheal illness, evolving AMS and then resp failure. UA suggestive UTI. Intubated and transferred to [facility] for ARF and consideration for HD.   SIGNIFICANT EVENTS / STUDIES:  4/11 Renal u/s >> no hydronephrosis  LINES / TUBES: ETT 4/11 >>  HD cath 4/11 >>   CULTURES: Blood 4/10 >> GPC in chains >>  Urine 4/10 >> negative Urine 4/11 >>  MRSA screen 4/10 >> negative C diff 4/11 >> negative  ANTIBIOTICS: vanco IV 4/10 >>   Cefepime 4/11 >>  Levaquin 4/11 x 1 dose Ceftriaxone 4/10 x 1 dose Flagyl IV 4/11 x 1 dose vanco PO 4/11 x 1 dose  SUBJECTIVE:  Tolerating SBT.  Denies chest pain.  C/o abd discomfort.  VITAL SIGNS: Temp:  [98.4 F (36.9 C)-100.2 F (37.9 C)] 98.6 F (37 C) (04/12 0411) Pulse Rate:  [96-113] 102 (04/12 0600) Resp:  [18-34] 23 (04/12 0600) BP: (90-178)/(52-86) 107/81 mmHg (04/12 0600) SpO2:  [95 %-100 %] 98 % (04/12 0600) FiO2 (%):  [30 %-40 %] 30 % (04/12 0331) Weight:  [216 lb 14.9 oz (98.4 kg)] 216 lb 14.9 oz (98.4 kg) (04/11 2200) HEMODYNAMICS:   VENTILATOR SETTINGS: Vent Mode:  [-] PRVC FiO2 (%):  [30 %-40 %] 30 % Set Rate:  [18 bmp] 18 bmp Vt Set:  [500 mL] 500 mL PEEP:  [5 cmH20] 5 cmH20 Plateau Pressure:  [20 cmH20] 20 cmH20 INTAKE / OUTPUT: Intake/Output     04/11 0701 - 04/12 0700 04/12 0701 - 04/13 0700   I.V. (mL/kg) 4400 (44.7)    IV Piggyback 100    Total Intake(mL/kg) 4500 (45.7)    Urine (mL/kg/hr) 1045 (0.4)    Stool 1400 (0.6)    Total Output 2445     Net +2055          Stool Occurrence 1 x      PHYSICAL EXAMINATION: General: No distress Neuro:  Alert, normal strength HEENT: ETT in place Cardiovascular: regular Lungs:  Decreased breath sounds at bases Abdomen: soft, mild tenderness, decreased bowel sounds Musculoskeletal:  No deformities, no edema Skin:  No rash  LABS:  Recent Labs Lab 11/19/12 0240 11/19/12 0839 11/20/12 0433  HGB 12.4 11.4* 10.7*  HCT 36.0 32.5* 30.2*  WBC 33.1* 27.1* 20.2*  PLT 207 177 157    Recent Labs Lab 11/18/12 1845 11/18/12 2106 11/19/12 0240 11/19/12 0839 11/19/12 1800 11/20/12 0433  NA 128*  --  128*  --  132* 136  K 3.3*  --  3.8  --  3.0* 3.0*  CL 91*  --  98  --  102 101  CO2 17*  --  13*  --  16* 21  GLUCOSE 110*  --  143*  --  119* 130*  BUN 56*  --  54*  --  55* 53*  CREATININE 4.32*  --  3.99* 3.86* 3.97* 3.77*  CALCIUM 7.9*  --  6.6*  --  6.3* 7.0*  MG  --   --  1.3*  --   --  2.4  PHOS  --  3.9  --   --  4.6 3.9    Recent Labs Lab 11/18/12 2330 11/19/12 0305 11/19/12 0806 11/19/12 1240  11/20/12 0300  PHART 7.321* 7.256* 7.310* 7.283* 7.467*  PCO2ART 26.8* 32.4* 23.3* 21.8* 25.3*  PO2ART 65.5* 162.0* 227.0* 92.0 84.6  HCO3 13.5* 13.9* 11.2* 10.3* 18.1*  TCO2 11.8 12.9 11.9 11 18.8  O2SAT 90.6 98.2 99.8 96.0 97.1    Recent Labs Lab 11/19/12 0955 11/20/12 0435  PROCALCITON 132.18 86.99    Imaging: Dg Chest 1 View  11/19/2012  *RADIOLOGY REPORT*  Clinical Data: Acute respiratory failure.  CHEST - 1 VIEW  Comparison: Chest radiograph performed 11/18/2012  Findings: Worsening left basilar airspace opacification is concerning for pneumonia.  Underlying mild vascular congestion is seen.  A small left pleural effusion is likely present.  No pneumothorax is seen.  The mediastinal silhouette is borderline normal in size.  No acute osseous anomalies are identified.  IMPRESSION: Worsening left basilar airspace opacification is concerning for pneumonia; likely small left pleural effusion noted.  Underlying mild vascular congestion seen.   Original Report Authenticated By: Tonia Ghent, M.D.    US Renal Port  11/19/2012  *RADIOLOGY REPORT*   Clinical Data: Acute renal failure  RENAL/URINARY TRACT ULTRASOUND COMPLETE  Comparison:  None.  Findings:  Right Kidney:  Measures 30.1 cm.  No mass or hydronephrosis.  Left Kidney:  Measures 13.9 cm.  No mass or hydronephrosis.  Bladder:  Decompressed by indwelling Foley catheter.  IMPRESSION: No hydronephrosis.  Bladder is decompressed by indwelling Foley catheter.   Original Report Authenticated By: Charline Bills, M.D.    Dg Chest Port 1 View  11/20/2012  *RADIOLOGY REPORT*  Clinical Data: Acute respiratory failure on ventilator.  PORTABLE CHEST - 1 VIEW  Comparison: 11/19/2012  Findings: Low lung volumes again noted.  Left lower lobe opacity shows no significant change.  No definite pleural effusion seen. Heart size is stable.  Support apparatus remains in appropriate position.  IMPRESSION: Low lung volumes and left lower lobe opacity, without significant change.   Original Report Authenticated By: Myles Rosenthal, M.D.    Dg Chest Port 1 View  11/19/2012  *RADIOLOGY REPORT*  Clinical Data: Hemodialysis catheter insertion.  PORTABLE CHEST - 1 VIEW  Comparison: 11/19/2012.  Findings: Endotracheal tube is in satisfactory position. Nasogastric tube is followed into the stomach with the tip projecting beyond the inferior margin of the image.  Right IJ dialysis catheter tip projects over the SVC.  Heart size is grossly stable and accentuated by AP semi upright technique and low lung volumes. Mild bibasilar volume loss.  There may be a tiny left pleural effusion.  No pneumothorax.  IMPRESSION:  1.  Satisfactory right IJ dialysis catheter placement without pneumothorax. 2.  Low lung volumes with bibasilar atelectasis. 3.  Question tiny left pleural effusion.   Original Report Authenticated By: Leanna Battles, M.D.    Dg Chest Portable 1 View  11/18/2012  *RADIOLOGY REPORT*  Clinical Data: Altered mental status, vomiting  PORTABLE CHEST - 1 VIEW  Comparison: None.  Findings: Low lung volumes with vascular  crowding.  Mild bibasilar opacities, likely atelectasis.  No pleural effusion or pneumothorax.  The heart is normal in size.  IMPRESSION: Low lung volumes with bibasilar atelectasis.   Original Report Authenticated By: Charline Bills, M.D.    Dg Chest Port 1v Same Day  11/19/2012  *RADIOLOGY REPORT*  Clinical Data: Endotracheal tube and enteric tube placement.  PORTABLE CHEST - 1 VIEW SAME DAY  Comparison: Chest radiograph performed earlier today at 01:20 a.m.  Findings: The patient's endotracheal tube is seen ending 2-3 cm above the carina.  An  enteric tube is noted extending below the diaphragm.  The lungs are hypoexpanded.  Patchy left basilar and right perihilar airspace opacification appears slightly worsened at the right lung apex, concerning for multifocal pneumonia.  The previously noted left pleural effusion is less well characterized. No pneumothorax is seen.  Mild vascular congestion is noted.  The cardiomediastinal silhouette is borderline normal in size.  No acute osseous abnormalities are seen.  IMPRESSION:  1.  Endotracheal tube seen ending 2-3 cm above the carina. 2.  Enteric tube noted extending below the diaphragm. 3.  Lungs hypoexpanded.  Patchy bilateral airspace opacification appears slightly worsened at the right lung apex, concerning for multifocal pneumonia.  Mild vascular congestion seen.   Original Report Authenticated By: Tonia Ghent, M.D.      ASSESSMENT / PLAN:  PULMONARY A: Acute respiratory failure 2nd to probable PNA, metabolic derangements. P:   -pressure support wean >> may try extubation soon -f/u CXR  CARDIOVASCULAR A: Hypotension 2nd to severe sepsis and hypovolemia from diarrhea. P:  -goal CVP > 8 -pressors if needed to keep MAP > 65   RENAL A:  Acute renal failure 2nd to hypotension and NSAIDS. Non-gap acidosis 2nd to GI losses and renal failure. P:   -continue IV fluids -monitor renal fx, urine outpt, electrolytes -d/c HCO3 from IV fluid  4/12  GASTROINTESTINAL A:  Gastroenteritis >> C diff negative. P:   -tube feeds if not able to extubate -pepcid for SUP  HEMATOLOGIC A: Anemia of critical illness P: -f/u CBC  INFECTIOUS A:  Severe sepsis with GPC in chains in blood culture x 2. P:   -continue vancomycin, cefepime -check Echo  ENDOCRINE A:  No hx of DM. P:   -monitor blood sugar on BMET  NEUROLOGIC A: Acute metabolic encephalopathy >> improved. P:   -limit sedation while weaning vent  Updated husband at bedside.  Coralyn Helling, MD [facility] Pulmonary/Critical Care 11/20/2012, 7:57 AM Pager:  [phone] After 3pm call: [phone]

## 2012-11-20 NOTE — Progress Notes (Signed)
  Echocardiogram 2D Echocardiogram has been performed.  Georgian Co 11/20/2012, 3:49 PM

## 2012-11-20 NOTE — Significant Event (Signed)
Pt has Group A strep in blood culture x 2.  Will d/c vancomycin and add clindamycin.  Coralyn Helling, MD [facility] Pulmonary/Critical Care 11/20/2012, 4:02 PM Pager:  [phone] After 3pm call: [phone]

## 2012-11-20 NOTE — Procedures (Signed)
Extubation Procedure Note  Patient Details:   Name: Deanna Becker DOB: 1945/10/14 MRN: [id number]   Airway Documentation:  Airway 7 mm (Active)  Secured at (cm) 23 cm 11/20/2012  3:31 AM  Measured From Lips 11/20/2012  3:31 AM  Secured Location Right 11/20/2012  3:31 AM  Secured By [employer] 11/20/2012  3:31 AM  Tube Holder Repositioned Yes 11/20/2012  3:31 AM  Cuff Pressure (cm H2O) 25 cm H2O 11/19/2012  7:50 PM    Evaluation  O2 sats: stable throughout Complications: No apparent complications Patient did tolerate procedure well. Bilateral Breath Sounds: Clear Suctioning: Airway Yes  Ave Filter 11/20/2012, 8:50 AM

## 2012-11-21 ENCOUNTER — Inpatient Hospital Stay (HOSPITAL_COMMUNITY): Payer: Medicare Other

## 2012-11-21 DIAGNOSIS — A400 Sepsis due to streptococcus, group A: Secondary | ICD-10-CM | POA: Diagnosis not present

## 2012-11-21 LAB — COMPREHENSIVE METABOLIC PANEL
ALT: 24 U/L (ref 0–35)
AST: 15 U/L (ref 0–37)
CO2: 26 mEq/L (ref 19–32)
Chloride: 106 mEq/L (ref 96–112)
GFR calc non Af Amer: 21 mL/min — ABNORMAL LOW (ref >90)
Potassium: 2.9 mEq/L — ABNORMAL LOW (ref 3.5–5.1)
Sodium: 141 mEq/L (ref 135–145)
Total Bilirubin: 0.5 mg/dL (ref 0.3–1.2)

## 2012-11-21 LAB — BASIC METABOLIC PANEL
CO2: 24 mEq/L (ref 19–32)
Calcium: 7.5 mg/dL — ABNORMAL LOW (ref 8.4–10.5)
Chloride: 109 mEq/L (ref 96–112)
Creatinine, Ser: 1.86 mg/dL — ABNORMAL HIGH (ref 0.50–1.10)
Glucose, Bld: 135 mg/dL — ABNORMAL HIGH (ref 70–99)
Sodium: 144 mEq/L (ref 135–145)

## 2012-11-21 LAB — CBC
MCH: 29.3 pg (ref 26.0–34.0)
MCHC: 35.8 g/dL (ref 30.0–36.0)
Platelets: 154 10*3/uL (ref 150–400)

## 2012-11-21 MED ORDER — POTASSIUM CHLORIDE 10 MEQ/50ML IV SOLN
10.0000 meq | INTRAVENOUS | Status: AC
  Administered 2012-11-21 (×4): 10 meq via INTRAVENOUS
  Filled 2012-11-21 (×3): qty 50

## 2012-11-21 MED ORDER — POTASSIUM CHLORIDE CRYS ER 20 MEQ PO TBCR
40.0000 meq | EXTENDED_RELEASE_TABLET | Freq: Once | ORAL | Status: AC
  Administered 2012-11-21: 40 meq via ORAL
  Filled 2012-11-21: qty 2

## 2012-11-21 NOTE — Progress Notes (Signed)
Hypokalemia   K replaced  

## 2012-11-21 NOTE — Progress Notes (Signed)
[facility] Rounding Note  Impression/Recommendations  67 year old WF with no PMH admitted with renal failure and an anion gap metabolic acidosis in the setting of a severe diarrheal illness of at least 2 days duration, with associated volume depletion, "possible" NSAID use (family indicated yes, pt indicated no), possible UTI, and group A strep bacteremia .   Acute kidney injury (baseline not known) with metabolic acidosis  Excellent urine output and creatinine continues to fall Requiring intermittent potassium repletion - ordered today by primary service Acidosis has resolved and patient is maintaining off bicarbonate infusion Dialysis was not required (line still in place - being used for IV access  Will sign off at this time (discussed with Dr. Craige Cotta yesterday)   Subjective:  Awake and alert Extubated yesterday and only c/o is cough  Objective Vital signs in last 24 hours: Filed Vitals:   11/21/12 0432 11/21/12 0500 11/21/12 0600 11/21/12 0700  BP:  103/58 93/68 135/76  Pulse:  85 82 86  Temp:      TempSrc:      Resp:  21 22 23   Height:      Weight: 75.8 kg (167 lb 1.7 oz)     SpO2:  97% 97% 99%   Weight change: -22.6 kg (-49 lb 13.2 oz)  Intake/Output Summary (Last 24 hours) at 11/21/12 0744 Last data filed at 11/21/12 0700  Gross per 24 hour  Intake 3582.08 ml  Output   4075 ml  Net -492.92 ml  I/O last 3 completed shifts: In: 5352.1 [I.V.:4852.1; IV Piggyback:500] Out: 5810 [Urine:3910; Stool:1900]   ZOX:WRUEA, oriented. NAD. Family visiting Neck: Right IJ HD cath still in place and being used for access Chest: Anteriorly clear  Heart: regular, no rub or gallop  Abdomen: soft, hypoactive bowel sounds  Ext: No edema  Foley with clear yellow urine   Labs: Basic Metabolic Panel:  Recent Labs Lab 11/18/12 1845 11/18/12 2106 11/19/12 0240 11/19/12 0839 11/19/12 1800 11/20/12 0433 11/20/12 1520 11/21/12 0131  NA 128*  --  128*  --  132*  136 142 141  K 3.3*  --  3.8  --  3.0* 3.0* 3.0* 2.9*  CL 91*  --  98  --  102 101 104 106  CO2 17*  --  13*  --  16* 21 26 26   GLUCOSE 110*  --  143*  --  119* 130* 105* 99  BUN 56*  --  54*  --  55* 53* 48* 40*  CREATININE 4.32*  --  3.99* 3.86* 3.97* 3.77* 3.17* 2.35*  CALCIUM 7.9*  --  6.6*  --  6.3* 7.0* 7.6* 7.4*  PHOS  --  3.9  --   --  4.6 3.9  --   --    Liver Function Tests:  Recent Labs Lab 11/18/12 1845 11/19/12 0240 11/19/12 1800 11/21/12 0131  AST 25 28  --  15  ALT 35 33  --  24  ALKPHOS 149* 119*  --  113  BILITOT 1.5* 1.1  --  0.5  PROT 7.1 6.2  --  5.5*  ALBUMIN 2.9* 2.3* 2.0* 2.0*   CBC:  Recent Labs Lab 11/18/12 1845 11/19/12 0240 11/19/12 0839 11/20/12 0433 11/21/12 0131  WBC 28.1* 33.1* 27.1* 20.2* 16.8*  NEUTROABS 26.7*  --   --   --   --   HGB 13.5 12.4 11.4* 10.7* 10.8*  HCT 39.6 36.0 32.5* 30.2* 30.2*  MCV 86.7 86.3 84.4 82.1 82.1  PLT  240 207 177 157 154    Recent Labs Lab 11/18/12 2254  CKTOTAL 59   CBG:  Recent Labs Lab 11/20/12 0738 11/20/12 1134 11/20/12 1523 11/20/12 2007 11/20/12 2354  GLUCAP 120* 112* 105* 88 102*  Results for MISCHELE, DETTER (MRN [id number]) as of 11/19/2012 14:29   Ref. Range  11/18/2012 19:06  11/19/2012 07:33   Color, Urine  Latest Range: YELLOW  YELLOW  YELLOW   APPearance  Latest Range: CLEAR  CLEAR  CLOUDY (A)   Specific Gravity, Urine  Latest Range: 1.005-1.030  >1.030 (H)  1.016   pH  Latest Range: 5.0-8.0  5.5  5.0   Glucose  Latest Range: NEGATIVE mg/dL  NEGATIVE  NEGATIVE   Bilirubin Urine  Latest Range: NEGATIVE  NEGATIVE  NEGATIVE   Ketones, ur  Latest Range: NEGATIVE mg/dL  NEGATIVE  NEGATIVE   Protein  Latest Range: NEGATIVE mg/dL  NEGATIVE  657 (A)   Urobilinogen, UA  Latest Range: 0.0-1.0 mg/dL  0.2  0.2   Nitrite  Latest Range: NEGATIVE  NEGATIVE  NEGATIVE   Leukocytes, UA  Latest Range: NEGATIVE  TRACE (A)  MODERATE (A)   Hgb urine dipstick  Latest Range: NEGATIVE  NEGATIVE  LARGE  (A)   Urine-Other  No range found   AMORPHOUS URATES/PHOSPHATES   WBC, UA  Latest Range: <3 WBC/hpf  21-50  11-20   RBC / HPF  Latest Range: <3 RBC/hpf  0-2  3-6   Squamous Epithelial / LPF  Latest Range: RARE  RARE  RARE   Bacteria, UA  Latest Range: RARE  MANY (A)  MANY (A)   Casts  Latest Range: NEGATIVE  HYALINE CASTS (A)  GRANULAR CAST (A)   Results for ALMEDIA, CORDELL (MRN [id number]) as of 11/20/2012 08:01  Ref. Range 11/19/2012 02:40 11/20/2012 04:33  Magnesium Latest Range: 1.5-2.5 mg/dL 1.3 (L) 2.4   ABG    Component Value Date/Time   PHART 7.467* 11/20/2012 0300   PCO2ART 25.3* 11/20/2012 0300   PO2ART 84.6 11/20/2012 0300   HCO3 18.1* 11/20/2012 0300   TCO2 18.8 11/20/2012 0300   ACIDBASEDEF 5.0* 11/20/2012 0300   O2SAT 97.1 11/20/2012 0300   Urine protein:creatinine 1.18 grams  Blood culture group A strep US Renal Port  11/20/2012  **ADDENDUM** CREATED: 11/20/2012 09:10:16  Dictation error in body of original report.  Right kidney is normal in size, measuring 13.1 cm (not 30.1 cm).  Remainder of report, including the impression, is unchanged.  **END ADDENDUM** SIGNED BY: Charline Bills, M.D.   11/19/2012  *RADIOLOGY REPORT*  Clinical Data: Acute renal failure  RENAL/URINARY TRACT ULTRASOUND COMPLETE  Comparison:  None.  Findings:  Right Kidney:  Measures 30.1 cm.  No mass or hydronephrosis.  Left Kidney:  Measures 13.9 cm.  No mass or hydronephrosis.  Bladder:  Decompressed by indwelling Foley catheter.  IMPRESSION: No hydronephrosis.  Bladder is decompressed by indwelling Foley catheter.   Original Report Authenticated By: Charline Bills, M.D.    Dg Chest Port 1 View  11/20/2012  *RADIOLOGY REPORT*  Clinical Data: Acute respiratory failure on ventilator.  PORTABLE CHEST - 1 VIEW  Comparison: 11/19/2012  Findings: Low lung volumes again noted.  Left lower lobe opacity shows no significant change.  No definite pleural effusion seen. Heart size is stable.  Support apparatus remains  in appropriate position.  IMPRESSION: Low lung volumes and left lower lobe opacity, without significant change.   Original Report Authenticated By: Myles Rosenthal, M.D.    Dg  Chest Port 1 View  11/19/2012  *RADIOLOGY REPORT*  Clinical Data: Hemodialysis catheter insertion.  PORTABLE CHEST - 1 VIEW  Comparison: 11/19/2012.  Findings: Endotracheal tube is in satisfactory position. Nasogastric tube is followed into the stomach with the tip projecting beyond the inferior margin of the image.  Right IJ dialysis catheter tip projects over the SVC.  Heart size is grossly stable and accentuated by AP semi upright technique and low lung volumes. Mild bibasilar volume loss.  There may be a tiny left pleural effusion.  No pneumothorax.  IMPRESSION:  1.  Satisfactory right IJ dialysis catheter placement without pneumothorax. 2.  Low lung volumes with bibasilar atelectasis. 3.  Question tiny left pleural effusion.   Original Report Authenticated By: Leanna Battles, M.D.    Medications: . sodium chloride 125 mL/hr at 11/20/12 2325   . acidophilus  2 capsule Oral Daily  . antiseptic oral rinse  15 mL Mouth Rinse BID  . ceFEPime (MAXIPIME) IV  1 g Intravenous Q24H  . clindamycin (CLEOCIN) IV  900 mg Intravenous Q8H  . famotidine  20 mg Oral QHS  . heparin  5,000 Units Subcutaneous Q8H  . sodium chloride  3 mL Intravenous Q12H    I  have reviewed scheduled and prn medications.  Camille Bal, MD [facility] [phone] pager 11/21/2012, 7:44 AM

## 2012-11-21 NOTE — Progress Notes (Signed)
PULMONARY  / CRITICAL CARE MEDICINE  Name: Deanna Becker MRN: [id number] DOB: 1946/02/17    ADMISSION DATE:  11/18/2012  REFERRING MD :  Guy Franco [facility]  CHIEF COMPLAINT:  AMS, metabolic acidosis, VDRF  BRIEF PATIENT DESCRIPTION: 67 yo woman, little PMH, presented to [facility] after several days diarrheal illness, evolving AMS and then resp failure. UA suggestive UTI. Intubated and transferred to [facility] for ARF and consideration for HD.   SIGNIFICANT EVENTS / STUDIES:  4/11 Renal u/s >> no hydronephrosis  LINES / TUBES: ETT 4/11 >> 412 HD cath 4/11 >>   CULTURES: Blood 4/10 >> GPC in chains >> Group A strep-S. Pyogenes Urine 4/10 >> negative Urine 4/11 >> NEG  MRSA screen 4/10 >> negative C diff 4/11 >> negative  ANTIBIOTICS: vanco IV 4/10 >>  412  Cefepime 4/11 >>  Levaquin 4/11 x 1 dose Ceftriaxone 4/10 x 1 dose Flagyl IV 4/11 x 1 dose vanco PO 4/11 x 1 dose 4/12 Clindamycin>  SUBJECTIVE:  Extubated 4/12, doing well, up to chair w/o diff Sats adequate on 2 l/m  No chest pain . Minimal dyspnea. Dry cough    VITAL SIGNS: Temp:  [97.6 F (36.4 C)-99 F (37.2 C)] 98.1 F (36.7 C) (04/13 0746) Pulse Rate:  [78-113] 86 (04/13 0700) Resp:  [17-27] 23 (04/13 0700) BP: (93-152)/(58-85) 135/76 mmHg (04/13 0700) SpO2:  [95 %-100 %] 99 % (04/13 0700) Weight:  [75.8 kg (167 lb 1.7 oz)] 75.8 kg (167 lb 1.7 oz) (04/13 0432) HEMODYNAMICS:   VENTILATOR SETTINGS:   INTAKE / OUTPUT: Intake/Output     04/12 0701 - 04/13 0700 04/13 0701 - 04/14 0700   I.V. (mL/kg) 3132.1 (41.3)    IV Piggyback 450    Total Intake(mL/kg) 3582.1 (47.3)    Urine (mL/kg/hr) 3175 (1.7)    Stool 900 (0.5)    Total Output 4075     Net -492.9            PHYSICAL EXAMINATION: General: No distress Neuro:  Alert, normal strength HEENT: ETT in place Cardiovascular: regular Lungs: Decreased breath sounds at bases Abdomen: soft, mild tenderness, decreased bowel sounds Musculoskeletal:  No  deformities, no edema Skin:  No rash  LABS:  Recent Labs Lab 11/19/12 0839 11/20/12 0433 11/21/12 0131  HGB 11.4* 10.7* 10.8*  HCT 32.5* 30.2* 30.2*  WBC 27.1* 20.2* 16.8*  PLT 177 157 154    Recent Labs Lab 11/18/12 1845 11/18/12 2106 11/19/12 0240 11/19/12 0839 11/19/12 1800 11/20/12 0433 11/20/12 1520 11/21/12 0131  NA 128*  --  128*  --  132* 136 142 141  K 3.3*  --  3.8  --  3.0* 3.0* 3.0* 2.9*  CL 91*  --  98  --  102 101 104 106  CO2 17*  --  13*  --  16* 21 26 26   GLUCOSE 110*  --  143*  --  119* 130* 105* 99  BUN 56*  --  54*  --  55* 53* 48* 40*  CREATININE 4.32*  --  3.99* 3.86* 3.97* 3.77* 3.17* 2.35*  CALCIUM 7.9*  --  6.6*  --  6.3* 7.0* 7.6* 7.4*  MG  --   --  1.3*  --   --  2.4 2.5  --   PHOS  --  3.9  --   --  4.6 3.9  --   --     Recent Labs Lab 11/18/12 2330 11/19/12 0305 11/19/12 0806 11/19/12 1240 11/20/12 0300  PHART 7.321* 7.256* 7.310* 7.283* 7.467*  PCO2ART 26.8* 32.4* 23.3* 21.8* 25.3*  PO2ART 65.5* 162.0* 227.0* 92.0 84.6  HCO3 13.5* 13.9* 11.2* 10.3* 18.1*  TCO2 11.8 12.9 11.9 11 18.8  O2SAT 90.6 98.2 99.8 96.0 97.1    Recent Labs Lab 11/19/12 0955 11/20/12 0435 11/21/12 0131  PROCALCITON 132.18 86.99 38.57    Imaging: US Renal Port  11/20/2012  **ADDENDUM** CREATED: 11/20/2012 09:10:16  Dictation error in body of original report.  Right kidney is normal in size, measuring 13.1 cm (not 30.1 cm).  Remainder of report, including the impression, is unchanged.  **END ADDENDUM** SIGNED BY: Charline Bills, M.D.   11/19/2012  *RADIOLOGY REPORT*  Clinical Data: Acute renal failure  RENAL/URINARY TRACT ULTRASOUND COMPLETE  Comparison:  None.  Findings:  Right Kidney:  Measures 30.1 cm.  No mass or hydronephrosis.  Left Kidney:  Measures 13.9 cm.  No mass or hydronephrosis.  Bladder:  Decompressed by indwelling Foley catheter.  IMPRESSION: No hydronephrosis.  Bladder is decompressed by indwelling Foley catheter.   Original Report  Authenticated By: Charline Bills, M.D.    Dg Chest Port 1 View  11/20/2012  *RADIOLOGY REPORT*  Clinical Data: Acute respiratory failure on ventilator.  PORTABLE CHEST - 1 VIEW  Comparison: 11/19/2012  Findings: Low lung volumes again noted.  Left lower lobe opacity shows no significant change.  No definite pleural effusion seen. Heart size is stable.  Support apparatus remains in appropriate position.  IMPRESSION: Low lung volumes and left lower lobe opacity, without significant change.   Original Report Authenticated By: Myles Rosenthal, M.D.    Dg Chest Port 1 View  11/19/2012  *RADIOLOGY REPORT*  Clinical Data: Hemodialysis catheter insertion.  PORTABLE CHEST - 1 VIEW  Comparison: 11/19/2012.  Findings: Endotracheal tube is in satisfactory position. Nasogastric tube is followed into the stomach with the tip projecting beyond the inferior margin of the image.  Right IJ dialysis catheter tip projects over the SVC.  Heart size is grossly stable and accentuated by AP semi upright technique and low lung volumes. Mild bibasilar volume loss.  There may be a tiny left pleural effusion.  No pneumothorax.  IMPRESSION:  1.  Satisfactory right IJ dialysis catheter placement without pneumothorax. 2.  Low lung volumes with bibasilar atelectasis. 3.  Question tiny left pleural effusion.   Original Report Authenticated By: Leanna Battles, M.D.      ASSESSMENT / PLAN:  PULMONARY A: Acute respiratory failure 2nd to probable PNA, metabolic derangements. >extubated 4/12 , doing well w/ sats   P:   -Wean O2 for sat  -mobilize /PT consult   CARDIOVASCULAR A: Hypotension 2nd to severe sepsis and hypovolemia from diarrhea.>resolved  P:  Monitor    RENAL A:  Acute renal failure 2nd to hypotension and NSAIDS. Non-gap acidosis 2nd to GI losses and renal failure. P:   -continue IV fluids-decrease to 75cc /h -monitor renal fx, urine outpt, electrolytes   GASTROINTESTINAL A:  Gastroenteritis >> C diff  negative. P:   -change to regular diet  -pepcid for SUP  HEMATOLOGIC A: Anemia of critical illness P: -f/u CBC  INFECTIOUS A:  Severe sepsis with GPC in chains in blood culture x 2. P:   -continue clinda , cefepime -repeat BC , if Positive consider TEE   ENDOCRINE A:  No hx of DM. P:   -monitor blood sugar on BMET  NEUROLOGIC A: Acute metabolic encephalopathy >>resolved  P:   -monitor     Transfer to Tele  PARRETT,TAMMY NP-C  [place] [facility] 11/21/2012, 9:13 AM  [phone]  Reviewed above, examined pt, and agree with assessment/plan.    She continues to improve.  Will repeat blood cx's >> if still postive, then consider TEE.  Will get PT evaluation.  Keep CVL for today >> if stable, then likely d/c 4/14.  Transfer to telemetry.  Likely will be able to transition to oral Abx and d/c home in next 48 hrs >> keep on PCCM service.  Updated family at bedside.  Coralyn Helling, MD [facility] Care 11/21/2012, 9:38 AM Pager:  [phone] After 3pm call: [phone]

## 2012-11-21 NOTE — Evaluation (Signed)
Physical Therapy Evaluation Patient Details Name: Deanna Becker MRN: [id number] DOB: Feb 24, 1946 Today's Date: 11/21/2012 Time: 4098-1191 PT Time Calculation (min): 27 min  PT Assessment / Plan / Recommendation Clinical Impression  67 y.o. female admitted to [facility] for sepsis, acute resp failure, acute renal failure and encephalopathy.  She presents today generally weak and deconditioned.  She has a staggering gait pattern and without at least one upper extremity assist she has LOB with risk of falls.  I gave her an HEP to start working on in the room to help build her leg strength up and I believe with increased walking and mobility that she will return to her normal level of function without needing HHPT or OP PT f/u.  She has a very supportive family who will be there at home 24 hours.  PT will continue to follow her acutely. O2 sats remained in the mid 90s on RA with gait.      PT Assessment  Patient needs continued PT services    Follow Up Recommendations  No PT follow up    Does the patient have the potential to tolerate intense rehabilitation     NA  Barriers to Discharge None None    Equipment Recommendations  None recommended by PT    Recommendations for Other Services   none  Frequency Min 3X/week    Precautions / Restrictions Precautions Precautions: Fall Precaution Comments: staggering, stumbling gait pattern.    Pertinent Vitals/Pain See vitals flow sheet.       Mobility  Bed Mobility Bed Mobility: Sit to Supine Sit to Supine: 6: Modified independent (Device/Increase time);HOB flat;With rail Details for Bed Mobility Assistance: used rail to help with leverage getting her legs back into the bed Transfers Transfers: Sit to Stand;Stand to Sit Sit to Stand: 4: Min guard;With upper extremity assist;From chair/3-in-1;From toilet Stand to Sit: 5: Supervision;With upper extremity assist;To bed;To toilet;With armrests Details for Transfer Assistance: min guard assist  to steady pt for balance during transition to stand.  As long as she had a railing or support she could lower herself to sitting with supervision.   Ambulation/Gait Ambulation/Gait Assistance: 4: Min assist Ambulation Distance (Feet): 200 Feet Assistive device: 2 person hand held assist Ambulation/Gait Assistance Details: min assist to steady pt for balance due to staggering gait pattern.  She wanted to hold her husband's hand, but I [place]' believe that two people were required to actually keep her steday during gait.   Gait Pattern: Step-through pattern (staggering) Gait velocity: less than 1.8 ft/sec putting her at risk for recurrent falls General Gait Details: deconditioned gait pattern    Exercises General Exercises - Lower Extremity Long Arc Quad: AROM;Both;10 reps;Seated;Other (comment) (with 3 second holds) Hip ABduction/ADduction: AROM;Both;10 reps;Seated (adduction only against pillow for resistance with 5 sec hold) Hip Flexion/Marching: AROM;Both;10 reps;Seated Toe Raises: AROM;Both;20 reps;Seated Heel Raises: AROM;Both;20 reps;Seated   PT Diagnosis: Difficulty walking;Abnormality of gait;Generalized weakness  PT Problem List: Decreased strength;Decreased activity tolerance;Decreased balance;Decreased mobility PT Treatment Interventions: Gait training;Stair training;Functional mobility training;Therapeutic exercise;Therapeutic activities;Balance training;Neuromuscular re-education;Patient/family education   PT Goals Acute Rehab PT Goals PT Goal Formulation: With patient Time For Goal Achievement: 12/05/12 Potential to Achieve Goals: Good Pt will go Supine/Side to Sit: with modified independence;with HOB 0 degrees PT Goal: Supine/Side to Sit - Progress: Goal set today Pt will go Sit to Supine/Side: with modified independence;with HOB 0 degrees PT Goal: Sit to Supine/Side - Progress: Goal set today Pt will go Sit to Stand: with supervision PT Goal:  Sit to Stand - Progress: Goal  set today Pt will go Stand to Sit: with supervision PT Goal: Stand to Sit - Progress: Goal set today Pt will Transfer Bed to Chair/Chair to Bed: with supervision PT Transfer Goal: Bed to Chair/Chair to Bed - Progress: Goal set today Pt will Ambulate: >150 feet;with supervision PT Goal: Ambulate - Progress: Goal set today Pt will Go Up / Down Stairs: Flight;with supervision;with rail(s) PT Goal: Up/Down Stairs - Progress: Goal set today Pt will Perform Home Exercise Program: Independently PT Goal: Perform Home Exercise Program - Progress: Goal set today  Visit Information  Last PT Received On: 11/21/12 Assistance Needed: +1    Subjective Data  Subjective: Pt reports she has been walking back and forth to the bathroom with assistance.   Patient Stated Goal: to get stronger and go home, get back to her usual self.     Prior Functioning  Home Living Lives With: Spouse Available Help at Discharge: Family;Available 24 hours/day Type of Home: House Home Access: Stairs to enter [employer] of Steps: 2 Entrance Stairs-Rails: None Home Layout: Two level Alternate Level Stairs-Number of Steps: 14 Alternate Level Stairs-Rails: Right Prior Function Level of Independence: Independent Able to Take Stairs?: Yes Communication Communication: No difficulties    Cognition  Cognition Overall Cognitive Status: Appears within functional limits for tasks assessed/performed Arousal/Alertness: Awake/alert Cognition - Other Comments: did not specifically test, conversation seemed normal.  Pt self reported memory issues    Extremity/Trunk Assessment Right Lower Extremity Assessment RLE ROM/Strength/Tone: Deficits RLE ROM/Strength/Tone Deficits: functionally pt reports bil LE weakness with gait.  no obvious asymmetries noted left vs right Left Lower Extremity Assessment LLE ROM/Strength/Tone: Deficits LLE ROM/Strength/Tone Deficits: functionally pt reports bil LE weakness with gait.  no  obvious asymmetries noted left vs right      End of Session PT - End of Session Activity Tolerance: Patient limited by fatigue Patient left: in bed;with call bell/phone within reach;with family/visitor present    Lurena Joiner B. Juda Toepfer, PT, DPT [phone]   11/21/2012, 3:38 PM

## 2012-11-22 DIAGNOSIS — B950 Streptococcus, group A, as the cause of diseases classified elsewhere: Secondary | ICD-10-CM

## 2012-11-22 DIAGNOSIS — A409 Streptococcal sepsis, unspecified: Principal | ICD-10-CM

## 2012-11-22 LAB — CBC
HCT: 33.5 % — ABNORMAL LOW (ref 36.0–46.0)
Hemoglobin: 11.7 g/dL — ABNORMAL LOW (ref 12.0–15.0)
MCH: 28.8 pg (ref 26.0–34.0)
MCV: 82.5 fL (ref 78.0–100.0)
Platelets: 215 10*3/uL (ref 150–400)
RBC: 4.06 MIL/uL (ref 3.87–5.11)

## 2012-11-22 LAB — GLUCOSE, CAPILLARY: Glucose-Capillary: 90 mg/dL (ref 70–99)

## 2012-11-22 LAB — BASIC METABOLIC PANEL
BUN: 21 mg/dL (ref 6–23)
CO2: 27 mEq/L (ref 19–32)
Calcium: 8 mg/dL — ABNORMAL LOW (ref 8.4–10.5)
Chloride: 106 mEq/L (ref 96–112)
Creatinine, Ser: 1.09 mg/dL (ref 0.50–1.10)
Glucose, Bld: 130 mg/dL — ABNORMAL HIGH (ref 70–99)

## 2012-11-22 LAB — MAGNESIUM: Magnesium: 1.9 mg/dL (ref 1.5–2.5)

## 2012-11-22 MED ORDER — LEVOFLOXACIN 750 MG PO TABS
750.0000 mg | ORAL_TABLET | Freq: Every day | ORAL | Status: DC
  Administered 2012-11-22 – 2012-11-24 (×3): 750 mg via ORAL
  Filled 2012-11-22 (×4): qty 1

## 2012-11-22 MED ORDER — POTASSIUM CHLORIDE CRYS ER 20 MEQ PO TBCR
40.0000 meq | EXTENDED_RELEASE_TABLET | Freq: Once | ORAL | Status: AC
  Administered 2012-11-22: 40 meq via ORAL
  Filled 2012-11-22: qty 2

## 2012-11-22 NOTE — Progress Notes (Signed)
PULMONARY  / CRITICAL CARE MEDICINE  Name: Deanna Becker MRN: [id number] DOB: [birth year]    ADMISSION DATE:  11/18/2012  REFERRING MD :  [facility] Franco [facility]  CHIEF COMPLAINT:  AMS, metabolic acidosis, VDRF  BRIEF PATIENT DESCRIPTION: 67 yo woman, little PMH, presented to [facility] after several days diarrheal illness, evolving AMS and then resp failure. UA suggestive UTI. Intubated and transferred to [facility] for ARF and consideration for HD.   SIGNIFICANT EVENTS / STUDIES:  4/11 Renal u/s >> no hydronephrosis 11/20/12 TTE >> normal LV fxn, normal PAPs, normal valves - no evidence endocarditis.   LINES / TUBES: ETT 4/11 >> 412 HD cath 4/11 >>   CULTURES: Blood 4/10 >> 2 of 2 GPC in chains >> Group A strep-S. Pyogenes Urine 4/10 >> negative Urine 4/11 >> NEG  MRSA screen 4/10 >> negative C diff 4/11 >> negative Blood 4/13 >>   ANTIBIOTICS: vanco IV 4/10 >>  412  Cefepime 4/11 >>  Levaquin 4/11 x 1 dose Ceftriaxone 4/10 x 1 dose Flagyl IV 4/11 x 1 dose vanco PO 4/11 x 1 dose 4/12 Clindamycin>  SUBJECTIVE:  Comfortable, no complaints   VITAL SIGNS: Temp:  [97.8 F (36.6 C)-98.3 F (36.8 C)] 98.2 F (36.8 C) (04/14 0735) Pulse Rate:  [85-93] 93 (04/14 0735) Resp:  [18-20] 20 (04/14 0735) BP: (130-175)/(80-90) 130/80 mmHg (04/14 0735) SpO2:  [92 %-96 %] 96 % (04/14 0735) Weight:  [76.4 kg (168 lb 6.9 oz)] 76.4 kg (168 lb 6.9 oz) (04/13 2048) HEMODYNAMICS:   VENTILATOR SETTINGS:   INTAKE / OUTPUT: Intake/Output     04/13 0701 - 04/14 0700 04/14 0701 - 04/15 0700   P.O. 480    I.V. (mL/kg) 1750 (22.9)    IV Piggyback 200    Total Intake(mL/kg) 2430 (31.8)    Urine (mL/kg/hr) 300 (0.2)    Stool     Total Output 300     Net +2130          Urine Occurrence 4 x      PHYSICAL EXAMINATION: General: No distress Neuro:  Alert, normal strength HEENT: OP clear Cardiovascular: regular Lungs: Decreased breath sounds at bases Abdomen: soft, mild tenderness, decreased  bowel sounds Musculoskeletal:  No deformities, no edema Skin:  No rash  LABS:  Recent Labs Lab 11/20/12 0433 11/21/12 0131 11/22/12 0918  HGB 10.7* 10.8* 11.7*  HCT 30.2* 30.2* 33.5*  WBC 20.2* 16.8* 12.6*  PLT 157 154 215    Recent Labs Lab 11/18/12 1845 11/18/12 2106 11/19/12 0240  11/19/12 1800 11/20/12 0433 11/20/12 1520 11/21/12 0131 11/21/12 0950 11/22/12 0918  NA 128*  --  128*  --  132* 136 142 141 144 142  K 3.3*  --  3.8  --  3.0* 3.0* 3.0* 2.9* 3.2* 3.1*  CL 91*  --  98  --  102 101 104 106 109 106  CO2 17*  --  13*  --  16* 21 26 26 24 27   GLUCOSE 110*  --  143*  --  119* 130* 105* 99 135* 130*  BUN 56*  --  54*  --  55* 53* 48* 40* 35* 21  CREATININE 4.32*  --  3.99*  < > 3.97* 3.77* 3.17* 2.35* 1.86* 1.09  CALCIUM 7.9*  --  6.6*  --  6.3* 7.0* 7.6* 7.4* 7.5* 8.0*  MG  --   --  1.3*  --   --  2.4 2.5  --   --  1.9  PHOS  --  3.9  --   --  4.6 3.9  --   --   --  2.1*  < > = values in this interval not displayed.  Recent Labs Lab 11/18/12 2330 11/19/12 0305 11/19/12 0806 11/19/12 1240 11/20/12 0300  PHART 7.321* 7.256* 7.310* 7.283* 7.467*  PCO2ART 26.8* 32.4* 23.3* 21.8* 25.3*  PO2ART 65.5* 162.0* 227.0* 92.0 84.6  HCO3 13.5* 13.9* 11.2* 10.3* 18.1*  TCO2 11.8 12.9 11.9 11 18.8  O2SAT 90.6 98.2 99.8 96.0 97.1    Recent Labs Lab 11/19/12 0955 11/20/12 0435 11/21/12 0131  PROCALCITON 132.18 86.99 38.57    Imaging: Dg Chest Port 1 View  11/21/2012  *RADIOLOGY REPORT*  Clinical Data: Extubation.  A follow-up left lower lobe atelectasis and/or pneumonia.  PORTABLE CHEST - 1 VIEW 11/21/2012 0653 hours:  Comparison: Portable chest x-rays yesterday and dating back to 11/18/2012.  Findings: Extubation.  Suboptimal inspiration.  Persistent airspace consolidation in the left lung base with improved aeration since yesterday.  No new pulmonary parenchymal abnormalities.  Right jugular central venous catheter tip projects over the mid SVC. Cardiac  silhouette enlarged but stable.  Pulmonary vascularity normal.  IMPRESSION: Improving left basilar atelectasis and/or pneumonia.  No new abnormalities post-extubation.   Original Report Authenticated By: Hulan Saas, M.D.      ASSESSMENT / PLAN:  PULMONARY A: Acute respiratory failure 2nd to probable PNA (vs basilar atx), metabolic derangements. >extubated 4/12 , doing well w/ sats   P:   -Wean O2 for sat > 90% -mobilize /PT   CARDIOVASCULAR A: Hypotension 2nd to severe sepsis and hypovolemia from diarrhea.>resolved  P:  Monitor    RENAL A:  Acute renal failure 2nd to hypotension and NSAIDS. Resolved 4/14 Non-gap acidosis 2nd to GI losses and renal failure. hypokalemia P:   -KVP IVF on 4/14 -monitor renal fx, urine outpt, electrolytes -replace K   GASTROINTESTINAL A:  Gastroenteritis >> C diff negative. P:   -regular diet  -pepcid for SUP  HEMATOLOGIC A: Anemia of critical illness P: -f/u CBC  INFECTIOUS A:  Severe sepsis GPC in chains in blood culture x 2 > group A strep ? LLL PNA P:   -d/c clinda after dose 4/14, change cefepime to PO levaquin 4/14 -repeat BC are pending 3/13, negative so far, if Positive then TEE   ENDOCRINE A:  No hx of DM. P:   -monitor blood sugar on BMET  NEUROLOGIC A: Acute metabolic encephalopathy >>resolved  P:   -monitor     She continues to improve. Follow repeat blood cx's >> if postive, then consider TEE.  CVC d/c 4/14.    Levy Pupa, MD, PhD 11/22/2012, 11:13 AM [place] [phone] or if no answer [phone]

## 2012-11-22 NOTE — Progress Notes (Signed)
Per MD order, HD cath removed. Cathter intact. Vaseline pressure gauze to site, pressure held for a total of 30 min ( by me and 15 min by Morrie Sheldon RN, no bleeding to site. Pt instructed not to get out of bed for 30 min after pressure is held. Instucted to keep dressing CDI x 24hours, if bleeding occurs hold pressure, if bleeding does not stop contact nurse on staff. Pt verbalized understanding and did not have any questions. Consuello Masse

## 2012-11-22 NOTE — Progress Notes (Signed)
Called to draw labs from "central line" however patient does not have a line we are drawing blood from.   I notified lab that they needed to draw the labs.  Lab stated,  "They didn't have her on their DBIV list.    Lab to draw.   (Has HDC but not using it for lab draws).

## 2012-11-23 LAB — GLUCOSE, CAPILLARY

## 2012-11-23 LAB — PROTEIN ELECTROPHORESIS, SERUM
Beta 2: 9.6 % — ABNORMAL HIGH (ref 3.2–6.5)
Beta Globulin: 6.7 % (ref 4.7–7.2)
M-Spike, %: NOT DETECTED g/dL

## 2012-11-23 NOTE — Progress Notes (Signed)
PULMONARY  / CRITICAL CARE MEDICINE  Name: Deanna Becker MRN: [id number] DOB: 07/09/1946    ADMISSION DATE:  11/18/2012  REFERRING MD :  Guy Franco [facility]  CHIEF COMPLAINT:  AMS, metabolic acidosis, VDRF  BRIEF PATIENT DESCRIPTION: 67 yo woman, little PMH, presented to [facility] after several days diarrheal illness, evolving AMS and then resp failure. UA suggestive UTI. Intubated and transferred to [facility] for ARF and consideration for HD.   SIGNIFICANT EVENTS / STUDIES:  4/11 Renal u/s >> no hydronephrosis 11/20/12 TTE >> normal LV fxn, normal PAPs, normal valves - no evidence endocarditis.   LINES / TUBES: ETT 4/11 >> 412 HD cath 4/11 >>   CULTURES: Blood 4/10 >> 2 of 2 GPC in chains >> Group A strep-S. Pyogenes Urine 4/10 >> negative Urine 4/11 >> NEG  MRSA screen 4/10 >> negative C diff 4/11 >> negative Blood 4/13 >>   ANTIBIOTICS: vanco IV 4/10 >>  412  Cefepime 4/11 >>  Levaquin 4/11 x 1 dose Ceftriaxone 4/10 x 1 dose Flagyl IV 4/11 x 1 dose vanco PO 4/11 x 1 dose 4/12 Clindamycin>off 4-14 levaquin>> SUBJECTIVE:  Comfortable, no complaints   VITAL SIGNS: Temp:  [97.4 F (36.3 C)-98.4 F (36.9 C)] 98.4 F (36.9 C) (04/15 0740) Pulse Rate:  [85-96] 88 (04/15 0740) Resp:  [18-20] 18 (04/15 0740) BP: (143-183)/(75-96) 151/75 mmHg (04/15 0740) SpO2:  [94 %-97 %] 96 % (04/15 0740) Weight:  [73.891 kg (162 lb 14.4 oz)] 73.891 kg (162 lb 14.4 oz) (04/14 2055) HEMODYNAMICS:   VENTILATOR SETTINGS:   INTAKE / OUTPUT: Intake/Output     04/14 0701 - 04/15 0700 04/15 0701 - 04/16 0700   P.O. 240 240   I.V. (mL/kg) 582.6 (7.9)    IV Piggyback     Total Intake(mL/kg) 822.6 (11.1) 240 (3.2)   Urine (mL/kg/hr)     Total Output       Net +822.6 +240        Urine Occurrence 1 x 1 x     PHYSICAL EXAMINATION: General: No distress, sitting in chair Neuro:  Alert, normal strength HEENT: OP clear Cardiovascular: regular Lungs: Decreased breath sounds at bases Abdomen:  soft, mild tenderness, decreased bowel sounds Musculoskeletal:  No deformities, no edema Skin:  No rash  LABS:  Recent Labs Lab 11/20/12 0433 11/21/12 0131 11/22/12 0918  HGB 10.7* 10.8* 11.7*  HCT 30.2* 30.2* 33.5*  WBC 20.2* 16.8* 12.6*  PLT 157 154 215    Recent Labs Lab 11/18/12 1845 11/18/12 2106 11/19/12 0240  11/19/12 1800 11/20/12 0433 11/20/12 1520 11/21/12 0131 11/21/12 0950 11/22/12 0918  NA 128*  --  128*  --  132* 136 142 141 144 142  K 3.3*  --  3.8  --  3.0* 3.0* 3.0* 2.9* 3.2* 3.1*  CL 91*  --  98  --  102 101 104 106 109 106  CO2 17*  --  13*  --  16* 21 26 26 24 27   GLUCOSE 110*  --  143*  --  119* 130* 105* 99 135* 130*  BUN 56*  --  54*  --  55* 53* 48* 40* 35* 21  CREATININE 4.32*  --  3.99*  < > 3.97* 3.77* 3.17* 2.35* 1.86* 1.09  CALCIUM 7.9*  --  6.6*  --  6.3* 7.0* 7.6* 7.4* 7.5* 8.0*  MG  --   --  1.3*  --   --  2.4 2.5  --   --  1.9  PHOS  --  3.9  --   --  4.6 3.9  --   --   --  2.1*  < > = values in this interval not displayed.  Recent Labs Lab 11/18/12 2330 11/19/12 0305 11/19/12 0806 11/19/12 1240 11/20/12 0300  PHART 7.321* 7.256* 7.310* 7.283* 7.467*  PCO2ART 26.8* 32.4* 23.3* 21.8* 25.3*  PO2ART 65.5* 162.0* 227.0* 92.0 84.6  HCO3 13.5* 13.9* 11.2* 10.3* 18.1*  TCO2 11.8 12.9 11.9 11 18.8  O2SAT 90.6 98.2 99.8 96.0 97.1    Recent Labs Lab 11/19/12 0955 11/20/12 0435 11/21/12 0131  PROCALCITON 132.18 86.99 38.57    Imaging: No results found.   ASSESSMENT / PLAN:  PULMONARY A: Acute respiratory failure 2nd to probable PNA (vs basilar atx), metabolic derangements. >extubated 4/12 , doing well w/ sats   P:   -Wean O2 for sat > 90% -mobilize /PT   CARDIOVASCULAR A: Hypotension 2nd to severe sepsis and hypovolemia from diarrhea.>resolved  P:  Monitor    RENAL A:  Acute renal failure 2nd to hypotension and NSAIDS. Resolved 4/14 Non-gap acidosis 2nd to GI losses and renal failure. hypokalemia P:   -KVP  IVF on 4/14 -monitor renal fx, urine outpt, electrolytes -replace K   GASTROINTESTINAL A:  Gastroenteritis >> C diff negative. P:   -regular diet  -pepcid for SUP  HEMATOLOGIC A: Anemia of critical illness P: -f/u CBC  INFECTIOUS A:  Severe sepsis GPC in chains in blood culture x 2 > group A strep ? LLL PNA P:   -d/c clinda after dose 4/14, change cefepime to PO levaquin 4/14 -repeat BC are pending 3/13, negative so far, if Positive then TEE, should be able to be followed by outpt MD  ENDOCRINE A:  No hx of DM. P:   -monitor blood sugar on BMET  NEUROLOGIC A: Acute metabolic encephalopathy >>resolved  P:   -monitor     She continues to improve. Follow repeat blood cx's >> if postive, then consider TEE.  CVC d/c 4/14.  Plan d/c to home on 4/16  [facility] Minor ACNP [facility] PCC[facility] Pager [phone] till 3 pm If no answer page [phone] 11/23/2012, 11:09 AM  Levy Pupa, MD, PhD 11/23/2012, 3:49 PM [place] [facility] [phone] or if no answer [phone]

## 2012-11-23 NOTE — Progress Notes (Signed)
NUTRITION FOLLOW UP  Intervention:   Continue current interventions - pt declined oral nutrition supplements at this time. Intake is slowly improving.  Nutrition Dx:   Inadequate oral intake now related to resolving GI distress AEB pt report.  Goal:   Intake to meet >90% of estimated nutrition needs.  Monitor:   weight trends, lab trends, I/O's, po intake  Assessment:   Admitted with n/v/d and fever (up to 105) x 1 day. Lethargic. Multiple family members note similar illnesses. Has not seen MD in 40 years. Admitted to SD[facility] at [facility] initially, however transferred to ICU given SIRS, possibly from infectious diarrhea/UTI/PNA, and need for "vigorous hydration." Also with ARF 2/2 NSAID toxicity and hypovolemia. Developed respiratory distress and intubated 4/11. Transferred to [facility] for possible HD.   Extubated 4/12. HD was never initiated as pt responded to fluid resuscitation. HD catheter removed yesterday.  Currently on Carbohydrate Modified Medium diet. Consuming approximately 50% of meals at this time. Potassium and phosphorus are currently low.  Pt states that her appetite is improving, declined oral nutrition supplements. Has no questions/concerns regarding nutrition at this time.  Reports that her bowels are still loose, but no longer watery.  Height: Ht Readings from Last 1 Encounters:  11/18/12 5\' 6"  (1.676 m)    Weight Status:   Wt Readings from Last 1 Encounters:  11/22/12 162 lb 14.4 oz (73.891 kg)   Body mass index is 26.31 kg/(m^2).  Re-estimated needs:  Kcal: 1600 - 1800 Protein: 70 - 80 grams Fluid: 1.8 - 2 liters  Skin: intact  Diet Order: Carb Control Medium (1600 - 2000)   Intake/Output Summary (Last 24 hours) at 11/23/12 1027 Last data filed at 11/23/12 0754  Gross per 24 hour  Intake 822.58 ml  Output      0 ml  Net 822.58 ml    Last BM: 4/13   Labs:   Recent Labs Lab 11/19/12 1800 11/20/12 0433 11/20/12 1520 11/21/12 0131 11/21/12 0950  11/22/12 0918  NA 132* 136 142 141 144 142  K 3.0* 3.0* 3.0* 2.9* 3.2* 3.1*  CL 102 101 104 106 109 106  CO2 16* 21 26 26 24 27   BUN 55* 53* 48* 40* 35* 21  CREATININE 3.97* 3.77* 3.17* 2.35* 1.86* 1.09  CALCIUM 6.3* 7.0* 7.6* 7.4* 7.5* 8.0*  MG  --  2.4 2.5  --   --  1.9  PHOS 4.6 3.9  --   --   --  2.1*  GLUCOSE 119* 130* 105* 99 135* 130*    CBG (last 3)   Recent Labs  11/22/12 1627 11/22/12 2055 11/23/12 0757  GLUCAP 90 87 126*    Scheduled Meds: . acidophilus  2 capsule Oral Daily  . antiseptic oral rinse  15 mL Mouth Rinse BID  . famotidine  20 mg Oral QHS  . heparin  5,000 Units Subcutaneous Q8H  . levofloxacin  750 mg Oral Daily  . sodium chloride  3 mL Intravenous Q12H    Continuous Infusions: . sodium chloride 20 mL/hr at 11/22/12 1341    Jarold Motto MS, [place], LDN Pager: [phone] After-hours pager: [phone]

## 2012-11-23 NOTE — Progress Notes (Signed)
Physical Therapy Treatment Patient Details Name: Deanna Becker MRN: [id number] DOB: 09/28/1945 Today's Date: 11/23/2012 Time: 4098-1191 PT Time Calculation (min): 11 min  PT Assessment / Plan / Recommendation Comments on Treatment Session  Pt is independent in gait on levels and steps with no assistive device and husband supervison.  She does not need follow up PT or DME    Follow Up Recommendations  No PT follow up     Does the patient have the potential to tolerate intense rehabilitation     Barriers to Discharge        Equipment Recommendations  None recommended by PT    Recommendations for Other Services    Frequency     Plan All goals met and education completed, patient dischaged from PT services    Precautions / Restrictions     Pertinent Vitals/Pain No c/o pain    Mobility  Bed Mobility Bed Mobility: Sit to Supine Sit to Supine: 6: Modified independent (Device/Increase time);HOB flat;With rail Details for Bed Mobility Assistance: used rail to help with leverage getting her legs back into the bed Transfers Transfers: Sit to Stand;Stand to Sit Sit to Stand: Without upper extremity assist;7: Independent Stand to Sit: 7: Independent;Without upper extremity assist Details for Transfer Assistance: husband in room assisting patient Ambulation/Gait Ambulation/Gait Assistance: 7: Independent Ambulation Distance (Feet): 300 Feet Assistive device: None;1 person hand held assist Ambulation/Gait Assistance Details: pt walking in hall with hand hold assist. Pt cues for posture and reciprocal arm swing and pt was able to walk independently Gait Pattern: Step-through pattern;Within Functional Limits (staggering) General Gait Details: pt with some deconditioning evident, but is able to improve for short intervals with cues Stairs: Yes Stairs Assistance: 4: Min assist Stairs Assistance Details (indicate cue type and reason): assist for safety Stair Management Technique: Step  to pattern Number of Stairs: 2 Wheelchair Mobility Wheelchair Mobility: No    Exercises General Exercises - Lower Extremity Gluteal Sets: AROM;Both;5 reps;Standing Long Arc Quad: 10 reps (with 3 second holds) Hip ABduction/ADduction: AROM;Both;5 reps;Standing (adduction only against pillow for resistance with 5 sec hold) Heel Raises: Both Other Exercises Other Exercises: instructed in repeated sit to stand   PT Diagnosis:    PT Problem List:   PT Treatment Interventions:     PT Goals Acute Rehab PT Goals PT Goal Formulation: With patient Time For Goal Achievement: 12/05/12 Potential to Achieve Goals: Good Pt will go Supine/Side to Sit: with modified independence;with HOB 0 degrees PT Goal: Supine/Side to Sit - Progress: Met (per patient) Pt will go Sit to Supine/Side: with modified independence;with HOB 0 degrees PT Goal: Sit to Supine/Side - Progress: Met (per patient) Pt will go Sit to Stand: with supervision PT Goal: Sit to Stand - Progress: Met Pt will go Stand to Sit: with supervision PT Goal: Stand to Sit - Progress: Met Pt will Transfer Bed to Chair/Chair to Bed: with supervision PT Transfer Goal: Bed to Chair/Chair to Bed - Progress: Met Pt will Ambulate: >150 feet;with supervision PT Goal: Ambulate - Progress: Met Pt will Go Up / Down Stairs: Flight;with supervision;with rail(s) PT Goal: Up/Down Stairs - Progress: Met Pt will Perform Home Exercise Program: Independently PT Goal: Perform Home Exercise Program - Progress: Met  Visit Information  Last PT Received On: 11/23/12    Subjective Data  Subjective: Pt states she does not want a cane Patient Stated Goal: to get stronger and go home, get back to her usual self.     Cognition  Cognition  Overall Cognitive Status: Appears within functional limits for tasks assessed/performed Arousal/Alertness: Awake/alert Orientation Level: Appears intact for tasks assessed Behavior During Session: [facility] for tasks performed     Balance     End of Session PT - End of Session Activity Tolerance: Patient tolerated treatment well Patient left: with family/visitor present (walking in room with husband)   GP    Rosey Bath K. Manson Passey, [place] [phone] 11/23/2012, 11:27 AM

## 2012-11-24 ENCOUNTER — Inpatient Hospital Stay (HOSPITAL_COMMUNITY): Payer: Medicare Other

## 2012-11-24 LAB — BASIC METABOLIC PANEL
BUN: 15 mg/dL (ref 6–23)
Chloride: 102 mEq/L (ref 96–112)
GFR calc Af Amer: 80 mL/min — ABNORMAL LOW (ref >90)
Potassium: 3 mEq/L — ABNORMAL LOW (ref 3.5–5.1)
Sodium: 140 mEq/L (ref 135–145)

## 2012-11-24 LAB — GLUCOSE, CAPILLARY: Glucose-Capillary: 115 mg/dL — ABNORMAL HIGH (ref 70–99)

## 2012-11-24 MED ORDER — RISAQUAD PO CAPS: 2.0000 | ORAL_CAPSULE | Freq: Every day | ORAL | Status: DC

## 2012-11-24 MED ORDER — ALBUTEROL SULFATE HFA 108 (90 BASE) MCG/ACT IN AERS
1.0000 | INHALATION_SPRAY | Freq: Four times a day (QID) | RESPIRATORY_TRACT | Status: DC | PRN
  Administered 2012-11-24: 2 via RESPIRATORY_TRACT
  Filled 2012-11-24: qty 6.7

## 2012-11-24 MED ORDER — ACETAMINOPHEN 325 MG PO TABS: 650.0000 mg | ORAL_TABLET | Freq: Four times a day (QID) | ORAL | Status: DC | PRN

## 2012-11-24 MED ORDER — GUAIFENESIN ER 600 MG PO TB12: 600.0000 mg | ORAL_TABLET | Freq: Two times a day (BID) | ORAL | Status: DC

## 2012-11-24 MED ORDER — POTASSIUM CHLORIDE CRYS ER 20 MEQ PO TBCR: 40.0000 meq | EXTENDED_RELEASE_TABLET | ORAL | Status: DC

## 2012-11-24 MED ORDER — GUAIFENESIN ER 600 MG PO TB12
600.0000 mg | ORAL_TABLET | Freq: Two times a day (BID) | ORAL | Status: DC
  Filled 2012-11-24 (×2): qty 1

## 2012-11-24 MED ORDER — POTASSIUM CHLORIDE CRYS ER 20 MEQ PO TBCR
40.0000 meq | EXTENDED_RELEASE_TABLET | Freq: Once | ORAL | Status: AC
  Administered 2012-11-24: 40 meq via ORAL

## 2012-11-24 MED ORDER — LEVOFLOXACIN 750 MG PO TABS: 750.0000 mg | ORAL_TABLET | Freq: Every day | ORAL | Status: DC

## 2012-11-24 MED ORDER — POTASSIUM CHLORIDE CRYS ER 20 MEQ PO TBCR
EXTENDED_RELEASE_TABLET | ORAL | Status: AC
  Filled 2012-11-24: qty 2

## 2012-11-24 NOTE — Progress Notes (Signed)
Pt doing much better since the one breathing episode from earlier today. Pt instructed about additional prescription for Mucinex. Pt expressed understanding. Pt discharged home with husband.

## 2012-11-24 NOTE — Progress Notes (Signed)
Danford Bad, NP returned page, order received for portable chest x-ray, and Albuterol PRN. Discharge on hold for now. Will continue to monitor, and wait for results of chest x-ray.

## 2012-11-24 NOTE — Discharge Summary (Signed)
Physician Discharge Summary  Patient ID: Deanna Becker MRN: [id number] DOB/AGE: [birth year] 66 y.o.  Admit date: 11/18/2012 Discharge date: 11/24/2012    Discharge Diagnoses:  Principal Problem:   SIRS (systemic inflammatory response syndrome) Active Problems:   Acute renal failure   Hypokalemia   Hyponatremia   Metabolic acidosis   Urinary tract infection   Gastroenteritis   Pneumonia, organism unspecified   Acute respiratory failure   Septicemia due to group A Streptococcus    Brief Summary: Deanna Becker is a 67 y.o. y/o female with little PMH presented to [facility] 4/10 after several days diarrheal illness, evolving AMS and subsequent resp failure. UA suggestive UTI. Intubated and transferred to [facility] for ARF and consideration for HD.   SIGNIFICANT EVENTS / STUDIES:  4/11 Renal u/s >> no hydronephrosis  11/20/12 TTE >> normal LV fxn, normal PAPs, normal valves - no evidence endocarditis.   LINES / TUBES:  ETT 4/11 >> 412  HD cath 4/11 >>   CULTURES:  Blood 4/10 >> 2 of 2 GPC in chains >> Group A strep-S. Pyogenes  Urine 4/10 >> negative  Urine 4/11 >> NEG  MRSA screen 4/10 >> negative  C diff 4/11 >> negative  Blood 4/13 >>   ANTIBIOTICS:  vanco IV 4/10 >> 412  Cefepime 4/11 >>  Levaquin 4/11 x 1 dose  Ceftriaxone 4/10 x 1 dose  Flagyl IV 4/11 x 1 dose  vanco PO 4/11 x 1 dose  4/12 Clindamycin>off  4/14 levaquin>>   **For outpt follow up--  Need to ensure 4/13 blood cultures negative -- if POS will need TEE and further workup Need BMET to f/u hypokalemia and acute renal failure                                                                     Hospital Summary by Discharge Diagnosis  Acute respiratory failure - multifactorial r/t probable PNA, metabolic derangements, AMS, bibasilar atx.  Pt required intubation for short period and was extubated 4/12.  At time of d/c she is stable from resp standpoint with no need for supplemental O2, no SOB.  Probable PNA -  no organism specified.  Rx with above abx.  CXR now clear.   Hypotension - r/t volume loss and severe sepsis.  Severe Sepsis - in setting UTI, probable LLL PNA and group A strep bacteremia.  She was aggressively volume resuscitated and treated with broad spectrum abx.  Pct, lactate followed closely.   Hypovolemia - r/t severe diarrhea.  Aggressive volume resuscitation.   Group A strep bacteremia - Treated with abx above. 2D echo negative for vegetation. Follow up blood cultures pending.  Negative so far, if POS need to consider TEE.  Will f/u as outpt.   UTI    Acute renal failure -- r/t hypotension, NSAIDs, sepsis. Resolved 4/14.  Seen by renal.  No need for HD.  Initially required HCO3 gtt for short time.  Scr now wnl.  Non-gap acidosis-- r/t GI loos and acute renal failure.  See above.   Hypokalemia --  Likely r/t GI loss.  Improved.  K Remains slightly low.  No replacement 4/15.  Will replace today and f/u as outpt..    Diarrhea/gastroenteritis -- Resolved.  CDiff negative.  Cont  pro-biotic while on Abx.   Acute metabolic encephalopathy - r/t sepsis, acute renal failure.  Resolved.  Sedation was limited as able while on vent. Now awake, appropriate, no further PT needs.    **Pt to work on getting PCP.  We will f/u with her to ensure negative blood cultures, f/u BMET but she understands that she needs PCP and has a few she would prefer.  She will call at d/c.   Filed Vitals:   11/23/12 1451 11/23/12 1700 11/23/12 2145 11/24/12 0500  BP: 134/80 124/75 164/77 170/90  Pulse: 88 91 93 95  Temp: 97.2 F (36.2 C) 98.7 F (37.1 C) 98.7 F (37.1 C) 97.9 F (36.6 C)  TempSrc: Oral Oral Oral Oral  Resp: 18 18 18 18   Height:      Weight:   155 lb 6.4 oz (70.489 kg)   SpO2: 96% 97% 95% 95%     Discharge Labs  BMET  Recent Labs Lab 11/18/12 1845 11/18/12 2106 11/19/12 0240  11/19/12 1800 11/20/12 0433 11/20/12 1520 11/21/12 0131 11/21/12 0950 11/22/12 0918 11/24/12 0650   NA 128*  --  128*  --  132* 136 142 141 144 142 140  K 3.3*  --  3.8  --  3.0* 3.0* 3.0* 2.9* 3.2* 3.1* 3.0*  CL 91*  --  98  --  102 101 104 106 109 106 102  CO2 17*  --  13*  --  16* 21 26 26 24 27 28   GLUCOSE 110*  --  143*  --  119* 130* 105* 99 135* 130* 102*  BUN 56*  --  54*  --  55* 53* 48* 40* 35* 21 15  CREATININE 4.32*  --  3.99*  < > 3.97* 3.77* 3.17* 2.35* 1.86* 1.09 0.86  CALCIUM 7.9*  --  6.6*  --  6.3* 7.0* 7.6* 7.4* 7.5* 8.0* 8.2*  MG  --   --  1.3*  --   --  2.4 2.5  --   --  1.9  --   PHOS  --  3.9  --   --  4.6 3.9  --   --   --  2.1*  --   < > = values in this interval not displayed.   CBC   Recent Labs Lab 11/20/12 0433 11/21/12 0131 11/22/12 0918  HGB 10.7* 10.8* 11.7*  HCT 30.2* 30.2* 33.5*  WBC 20.2* 16.8* 12.6*  PLT 157 154 215   Anti-Coagulation No results found for this basename: INR,  in the last 168 hours   Discharge Orders   Future Appointments Provider Department Dept Phone   12/02/2012 9:00 AM Julio Sicks, NP [facility] [phone]   Future Orders Complete By Expires     Increase activity slowly  As directed           Follow-up Information   Follow up with PARRETT,TAMMY, NP On 12/02/2012. (9:00am with BMET )    Contact information:   [address] [place] [address] [phone]          Medication List    STOP taking these medications       ibuprofen 200 MG tablet  Commonly known as:  ADVIL,MOTRIN      TAKE these medications       acetaminophen 325 MG tablet  Commonly known as:  TYLENOL  Take 2 tablets (650 mg total) by mouth every 6 (six) hours as needed.     acidophilus Caps  Take 2 capsules by mouth daily.     levofloxacin 750 MG tablet  Commonly known as:  LEVAQUIN  Take 1 tablet (750 mg total) by mouth daily.     loratadine 10 MG tablet  Commonly known as:  CLARITIN  Take 10 mg by mouth every other day.         Disposition: Home   Discharged Condition: Deanna Becker has met  maximum benefit of inpatient care and is medically stable and cleared for discharge.  Patient is pending follow up as above.      Time spent on disposition:  Greater than 35 minutes.   SignedDanford Bad, NP 11/24/2012  10:15 AM Pager: [phone] or [phone]  *Care during the described time interval was provided by me and/or other providers on the critical care team. I have reviewed this patient's available data, including medical history, events of note, physical examination and test results as part of my evaluation.   Levy Pupa, MD, PhD 11/24/2012, 2:47 PM [facility] [phone] or if no answer [phone]

## 2012-11-24 NOTE — Progress Notes (Signed)
Patient Saturations on Room Air at Rest = 96% Patient Saturations on [employer] while Ambulating = 97%

## 2012-11-24 NOTE — Progress Notes (Signed)
After going over discharge instructions with pt, pt got up to get dressed with the assistance of her husband. Husband subsequently called me back into the room because the pt was having difficulty breathing. Pt was found sitting on the toilet having difficulty breathing, with coarse inspiratory and expiratory wheezes audible to the naked ear. 2L O2 was applied via nasal canula, and pt was encouraged to take deep breaths, and to rest. After approx 3 minutes, pt's breathing became quieter, but still seems like pt has something in her chest she needs to cough up, and is unable to. Pt appears uncomfortable. Pt has no breathing treatments ordered. Critical care paged. Awaiting response.

## 2012-11-24 NOTE — Progress Notes (Signed)
Discussed discharge instructions and medications with pt. Pt showed no barriers to discharge. IV removed. Tele removed. Pt discharged to home with family. Assessment unchanged from morning.  

## 2012-11-27 LAB — CULTURE, BLOOD (ROUTINE X 2): Culture: NO GROWTH

## 2012-11-29 ENCOUNTER — Telehealth: Payer: Self-pay | Admitting: Family Medicine

## 2012-11-30 NOTE — Telephone Encounter (Signed)
Deanna Becker would be a new patient to the practice.  She lives locally but doesn't "make a habit of going to doctors" therefore she doesn't have a PCP.  She was recently hospitalized and will follow-up with [facility] but also needs a PCP.    I told patient that I would find out when we would be able to schedule her and call her back tomorrow.

## 2012-12-02 ENCOUNTER — Ambulatory Visit (INDEPENDENT_AMBULATORY_CARE_PROVIDER_SITE_OTHER)
Admission: RE | Admit: 2012-12-02 | Discharge: 2012-12-02 | Disposition: A | Discharge status: Home / Self Care | Payer: Medicare Other | Source: Ambulatory Visit | Attending: Adult Health | Admitting: Adult Health

## 2012-12-02 ENCOUNTER — Encounter: Payer: Self-pay | Admitting: Adult Health

## 2012-12-02 ENCOUNTER — Other Ambulatory Visit (INDEPENDENT_AMBULATORY_CARE_PROVIDER_SITE_OTHER): Payer: Medicare Other

## 2012-12-02 ENCOUNTER — Ambulatory Visit (INDEPENDENT_AMBULATORY_CARE_PROVIDER_SITE_OTHER): Payer: Medicare Other | Admitting: Adult Health

## 2012-12-02 VITALS — BP 142/88 | HR 105 | Temp 97.7°F | Ht 65.0 in | Wt 152.2 lb

## 2012-12-02 DIAGNOSIS — R030 Elevated blood-pressure reading, without diagnosis of hypertension: Secondary | ICD-10-CM

## 2012-12-02 DIAGNOSIS — R6510 Systemic inflammatory response syndrome (SIRS) of non-infectious origin without acute organ dysfunction: Secondary | ICD-10-CM

## 2012-12-02 DIAGNOSIS — E876 Hypokalemia: Secondary | ICD-10-CM

## 2012-12-02 DIAGNOSIS — J189 Pneumonia, unspecified organism: Secondary | ICD-10-CM

## 2012-12-02 DIAGNOSIS — N179 Acute kidney failure, unspecified: Secondary | ICD-10-CM

## 2012-12-02 DIAGNOSIS — I10 Essential (primary) hypertension: Secondary | ICD-10-CM | POA: Insufficient documentation

## 2012-12-02 LAB — BASIC METABOLIC PANEL
Calcium: 9.2 mg/dL (ref 8.4–10.5)
Creatinine, Ser: 0.6 mg/dL (ref 0.4–1.2)
GFR: 102.51 mL/min (ref >60.00)
Sodium: 139 mEq/L (ref 135–145)

## 2012-12-02 NOTE — Assessment & Plan Note (Signed)
Needs to establish with PCP w/ health maintenance follow up  Low salt diet  Avoid NSAIDS

## 2012-12-02 NOTE — Progress Notes (Signed)
Subjective:    Patient ID: Deanna Becker, female    DOB: 1946/03/20, 67 y.o.   MRN: [id number]  HPI 67 year old female , never smoker , seen for initial consult by PCCM on admission 11/18/2012 for sepsis in the setting of urinary tract infection and left lower lobe pneumonia, and group A strep bacteremia.  12/02/2012 Post Hospital follow up  Returns for a post hospital followup. Patient was in her usual good health with little past medical history, developed a diarrheal illness and flu like symptoms with cough that progressively worsened. She went to any [facility] emergency room and was noted to be in acute respiratory failure, sepsis, and was transferred to [facility]. She was found to have a pneumonia, metabolic acidosis,  acute metabolic encephalopathy. Her condition was complicated by hypotension. She was found to have a group A strep bacteremia. A 2-D echo was negative for vegetation. CT was negative. Patient did have acute renal failure. That improved gradually with IV hydration. And discontinuation of nonsteroidals. She did undergo a renal consult. However, did not need hemodialysis. Repeat blood cultures on 4/13  were negative. She was treated with aggressive IV antibiotics, electrolyte replacement,,  IV hydration,  Discharged on Levaquin. Chest x-ray today shows no acute process. BMET today w/ scr return to normal at 0.6.   Since discharge. Patient is feeling improved. No new complaints.  finished levaquin. Good appetite w/ no n/v/d.  Cough is resolved .   Currently establishing with Ignacia Bayley F.M for PCP . Has never had a family doctor . "does not like doctors".   PMH :  None  No PCP - no mammogram, colonoscopy.   SH:  Married  No kids  Worked court system.  Never smoker.  No drugs.  Live in [place].  No etoh.    Review of Systems Constitutional:   No  weight loss, night sweats,  Fevers, chills, fatigue, or  lassitude.  HEENT:   No headaches,   Difficulty swallowing,  Tooth/dental problems, or  Sore throat,                No sneezing, itching, ear ache, nasal congestion, post nasal drip,   CV:  No chest pain,  Orthopnea, PND, swelling in lower extremities, anasarca, dizziness, palpitations, syncope.   GI  No heartburn, indigestion, abdominal pain, nausea, vomiting, diarrhea, change in bowel habits, loss of appetite, bloody stools.   Resp: No shortness of breath with exertion or at rest.  No excess mucus, no productive cough,  No non-productive cough,  No coughing up of blood.  No change in color of mucus.  No wheezing.  No chest wall deformity  Skin: no rash or lesions.  GU: no dysuria, change in color of urine, no urgency or frequency.  No flank pain, no hematuria   MS:  No joint pain or swelling.  No decreased range of motion.  No back pain.  Psych:  No change in mood or affect. No depression or anxiety.  No memory loss.         Objective:   Physical Exam GEN: A/Ox3; pleasant , NAD, well nourished   HEENT:  /AT,  EACs-clear, TMs-wnl, NOSE-clear, THROAT-clear, no lesions, no postnasal drip or exudate noted.   NECK:  Supple w/ fair ROM; no JVD; normal carotid impulses w/o bruits; no thyromegaly or nodules palpated; no lymphadenopathy.  RESP  Clear  P & A; w/o, wheezes/ rales/ or rhonchi.no accessory muscle use, no dullness to percussion  CARD:  RRR, no m/r/g  , no peripheral edema, pulses intact, no cyanosis or clubbing.  GI:   Soft & nt; nml bowel sounds; no organomegaly or masses detected.  Musco: Warm bil, no deformities or joint swelling noted.   Neuro: alert, no focal deficits noted.    Skin: Warm, no lesions or rashes         Assessment & Plan:

## 2012-12-02 NOTE — Assessment & Plan Note (Signed)
Resolved with scr return to normal at 0.6 .  Avoid NSAIDS  follow up with PCP -in process of establishing with [facility].

## 2012-12-02 NOTE — Assessment & Plan Note (Signed)
PNA -resolved on CXR today  Full course of abx completed  Advised to follow up with our office on as needed basis.  Would recommend Pneumovax - pt to think about.

## 2012-12-02 NOTE — Patient Instructions (Addendum)
Your Pneumonia has cleared on xray .  Your kidney function has returned to normal  Establish with a family doctor .  Follow up with our office Dr. Craige Cotta As needed  For respiratory problems.

## 2012-12-03 NOTE — Progress Notes (Signed)
Reviewed and agree with assessment/plan. 

## 2013-02-03 ENCOUNTER — Ambulatory Visit (INDEPENDENT_AMBULATORY_CARE_PROVIDER_SITE_OTHER): Payer: Medicare Other | Admitting: Nurse Practitioner

## 2013-02-03 ENCOUNTER — Encounter: Payer: Self-pay | Admitting: Nurse Practitioner

## 2013-02-03 VITALS — BP 198/105 | HR 105 | Temp 97.1°F | Ht 65.0 in | Wt 148.0 lb

## 2013-02-03 DIAGNOSIS — Z8701 Personal history of pneumonia (recurrent): Secondary | ICD-10-CM

## 2013-02-03 DIAGNOSIS — R05 Cough: Secondary | ICD-10-CM

## 2013-02-03 DIAGNOSIS — I10 Essential (primary) hypertension: Secondary | ICD-10-CM

## 2013-02-03 DIAGNOSIS — Z09 Encounter for follow-up examination after completed treatment for conditions other than malignant neoplasm: Secondary | ICD-10-CM

## 2013-02-03 LAB — COMPLETE METABOLIC PANEL WITH GFR
ALT: 17 U/L (ref 0–35)
AST: 14 U/L (ref 0–37)
Albumin: 4.3 g/dL (ref 3.5–5.2)
BUN: 13 mg/dL (ref 6–23)
CO2: 27 mEq/L (ref 19–32)
Calcium: 9.9 mg/dL (ref 8.4–10.5)
Chloride: 103 mEq/L (ref 96–112)
GFR, Est African American: >89 mL/min
Potassium: 4.9 mEq/L (ref 3.5–5.3)

## 2013-02-03 MED ORDER — LISINOPRIL-HYDROCHLOROTHIAZIDE 20-25 MG PO TABS: 1.0000 | ORAL_TABLET | Freq: Every day | ORAL | Status: DC

## 2013-02-03 NOTE — Patient Instructions (Addendum)
Pneumococcal Vaccine, Polyvalent solution for injection What is this medicine? PNEUMOCOCCAL VACCINE, POLYVALENT (NEU mo KOK al vak SEEN, pol ee VEY luhnt) is a vaccine to prevent pneumococcus bacteria infection. These bacteria are a major cause of ear infections, Strep throat infections, and serious pneumonia, meningitis, or blood infections worldwide. These vaccines help the body to produce antibodies (protective substances) that help your body defend against these bacteria. This vaccine is recommended for people 2 years of age and older with health problems. It is also recommended for all adults over 50 years old. This vaccine will not treat an infection. This medicine may be used for other purposes; ask your health care provider or pharmacist if you have questions. What should I tell my health care provider before I take this medicine? They need to know if you have any of these conditions: -bleeding problems -bone marrow or organ transplant -cancer, Hodgkin's disease -fever -infection -immune system problems -low platelet count in the blood -seizures -an unusual or allergic reaction to pneumococcal vaccine, diphtheria toxoid, other vaccines, latex, other medicines, foods, dyes, or preservatives -pregnant or trying to get pregnant -breast-feeding How should I use this medicine? This vaccine is for injection into a muscle or under the skin. It is given by a health care professional. A copy of Vaccine Information Statements will be given before each vaccination. Read this sheet carefully each time. The sheet may change frequently. Talk to your pediatrician regarding the use of this medicine in children. While this drug may be prescribed for children as young as 2 years of age for selected conditions, precautions do apply. Overdosage: If you think you have taken too much of this medicine contact a poison control center or emergency room at once. NOTE: This medicine is only for you. Do not share  this medicine with others. What if I miss a dose? It is important not to miss your dose. Call your doctor or health care professional if you are unable to keep an appointment. What may interact with this medicine? -medicines for cancer chemotherapy -medicines that suppress your immune function -medicines that treat or prevent blood clots like warfarin, enoxaparin, and dalteparin -steroid medicines like prednisone or cortisone This list may not describe all possible interactions. Give your health care provider a list of all the medicines, herbs, non-prescription drugs, or dietary supplements you use. Also tell them if you smoke, drink alcohol, or use illegal drugs. Some items may interact with your medicine. What should I watch for while using this medicine? Mild fever and pain should go away in 3 days or less. Report any unusual symptoms to your doctor or health care professional. What side effects may I notice from receiving this medicine? Side effects that you should report to your doctor or health care professional as soon as possible: -allergic reactions like skin rash, itching or hives, swelling of the face, lips, or tongue -breathing problems -confused -fever over 102 degrees F -pain, tingling, numbness in the hands or feet -seizures -unusual bleeding or bruising -unusual muscle weakness Side effects that usually do not require medical attention (report to your doctor or health care professional if they continue or are bothersome): -aches and pains -diarrhea -fever of 102 degrees F or less -headache -irritable -loss of appetite -pain, tender at site where injected -trouble sleeping This list may not describe all possible side effects. Call your doctor for medical advice about side effects. You may report side effects to FDA at 1-800-FDA-1088. Where should I keep my medicine? This   does not apply. This vaccine is given in a clinic, pharmacy, doctor's office, or other health care  setting and will not be stored at home. NOTE: This sheet is a summary. It may not cover all possible information. If you have questions about this medicine, talk to your doctor, pharmacist, or health care provider.  2013, Elsevier/Gold Standard. (03/03/2008 2:32:37 PM) Hypertension As your heart beats, it forces blood through your arteries. This force is your blood pressure. If the pressure is too high, it is called hypertension (HTN) or high blood pressure. HTN is dangerous because you may have it and not know it. High blood pressure may mean that your heart has to work harder to pump blood. Your arteries may be narrow or stiff. The extra work puts you at risk for heart disease, stroke, and other problems.  Blood pressure consists of two numbers, a higher number over a lower, 110/72, for example. It is stated as "110 over 72." The ideal is below 120 for the top number (systolic) and under 80 for the bottom (diastolic). Write down your blood pressure today. You should pay close attention to your blood pressure if you have certain conditions such as:  Heart failure.  Prior heart attack.  Diabetes  Chronic kidney disease.  Prior stroke.  Multiple risk factors for heart disease. To see if you have HTN, your blood pressure should be measured while you are seated with your arm held at the level of the heart. It should be measured at least twice. A one-time elevated blood pressure reading (especially in the Emergency Department) does not mean that you need treatment. There may be conditions in which the blood pressure is different between your right and left arms. It is important to see your caregiver soon for a recheck. Most people have essential hypertension which means that there is not a specific cause. This type of high blood pressure may be lowered by changing lifestyle factors such as:  Stress.  Smoking.  Lack of exercise.  Excessive weight.  Drug/tobacco/alcohol use.  Eating less  salt. Most people do not have symptoms from high blood pressure until it has caused damage to the body. Effective treatment can often prevent, delay or reduce that damage. TREATMENT  When a cause has been identified, treatment for high blood pressure is directed at the cause. There are a large number of medications to treat HTN. These fall into several categories, and your caregiver will help you select the medicines that are best for you. Medications may have side effects. You should review side effects with your caregiver. If your blood pressure stays high after you have made lifestyle changes or started on medicines,   Your medication(s) may need to be changed.  Other problems may need to be addressed.  Be certain you understand your prescriptions, and know how and when to take your medicine.  Be sure to follow up with your caregiver within the time frame advised (usually within two weeks) to have your blood pressure rechecked and to review your medications.  If you are taking more than one medicine to lower your blood pressure, make sure you know how and at what times they should be taken. Taking two medicines at the same time can result in blood pressure that is too low. SEEK IMMEDIATE MEDICAL CARE IF:  You develop a severe headache, blurred or changing vision, or confusion.  You have unusual weakness or numbness, or a faint feeling.  You have severe chest or abdominal pain, vomiting,  or breathing problems. MAKE SURE YOU:   Understand these instructions.  Will watch your condition.  Will get help right away if you are not doing well or get worse. Document Released: 07/28/2005 Document Revised: 10/20/2011 Document Reviewed: 03/17/2008 [facility] Patient Information 2014 [place].

## 2013-02-03 NOTE — Progress Notes (Signed)
  Subjective:    Patient ID: Deanna Becker, female    DOB: [birth year], 67 y.o.   MRN: [id number]  HPI Pt here for hospital follow-up. She was admitted to hospital Mid-April for 105.6 F temp, pneumonia, and  ARF. Pt states these have resolved and "back to normal".  Pt states she does have productive cough for the last 8 days. She is taking Mucinex DM BID and reports mild relief. No other complaints at this time.      Review of Systems  HENT: Positive for sneezing and postnasal drip. Negative for congestion and sinus pressure.   Respiratory: Positive for cough. Negative for shortness of breath.   All other systems reviewed and are negative.       Objective:   Physical Exam  Constitutional: She is oriented to person, place, and time. She appears well-developed and well-nourished.  HENT:  Head: Normocephalic.  Eyes: Pupils are equal, round, and reactive to light.  Neck: Normal range of motion. Neck supple. No thyromegaly present.  Cardiovascular: Regular rhythm and intact distal pulses.   Murmur (2/6 systolic murmur heard loudest at aortic valve.) heard. Tachycardia   Pulmonary/Chest: Effort normal. No respiratory distress.  Productive cough, coarse breath sounds bilateral  lower lobes  Abdominal: Soft. Bowel sounds are normal.  Musculoskeletal: Normal range of motion. She exhibits no edema.  Neurological: She is alert and oriented to person, place, and time.  Skin: Skin is warm and dry.  Psychiatric: She has a normal mood and affect. Her behavior is normal. Judgment and thought content normal.      BP 190/102  Pulse 105  Temp(Src) 97.1 F (36.2 C) (Oral)  Ht 5\' 5"  (1.651 m)  Wt 148 lb (67.132 kg)  BMI 24.63 kg/m2     Assessment & Plan:   1. Hospital discharge follow-up   2. Hx of bacterial pneumonia   3. Hypertension    Orders Placed This Encounter  Procedures  . COMPLETE METABOLIC PANEL WITH GFR  . NMR Lipoprofile with Lipids    Meds ordered this encounter   Medications  . lisinopril-hydrochlorothiazide (PRINZIDE,ZESTORETIC) 20-25 MG per tablet    Sig: Take 1 tablet by mouth daily.    Dispense:  30 tablet    Refill:  3    Order Specific Question:  Supervising Provider    Answer:  Ernestina Penna [[id number]]   Avoid salt in diet No decongestants OTC- Mucinex or robitussin plain is ok to take Low fat diet and exercise Follow up in 1 week for blood pressure check  Mary-Margaret Daphine Deutscher, FNP

## 2013-02-04 ENCOUNTER — Other Ambulatory Visit: Payer: Self-pay | Admitting: Nurse Practitioner

## 2013-02-04 LAB — NMR LIPOPROFILE WITH LIPIDS
Cholesterol, Total: 229 mg/dL — ABNORMAL HIGH (ref <200)
HDL Particle Number: 40.4 umol/L (ref >=30.5)
LDL Particle Number: 1975 nmol/L — ABNORMAL HIGH (ref <1000)
Large VLDL-P: 7.6 nmol/L — ABNORMAL HIGH (ref <=2.7)
Small LDL Particle Number: 1250 nmol/L — ABNORMAL HIGH (ref <=527)

## 2013-02-04 MED ORDER — SIMVASTATIN 40 MG PO TABS: 40.0000 mg | ORAL_TABLET | Freq: Every day | ORAL | Status: DC

## 2013-02-10 ENCOUNTER — Ambulatory Visit (INDEPENDENT_AMBULATORY_CARE_PROVIDER_SITE_OTHER): Payer: Medicare Other | Admitting: *Deleted

## 2013-02-10 VITALS — BP 176/91 | HR 107

## 2013-02-10 DIAGNOSIS — I10 Essential (primary) hypertension: Secondary | ICD-10-CM

## 2013-02-10 NOTE — Progress Notes (Signed)
Pt here for BP check per MMM.

## 2013-03-15 ENCOUNTER — Encounter: Payer: Self-pay | Admitting: Nurse Practitioner

## 2013-03-15 ENCOUNTER — Ambulatory Visit (INDEPENDENT_AMBULATORY_CARE_PROVIDER_SITE_OTHER): Payer: Medicare Other | Admitting: Nurse Practitioner

## 2013-03-15 VITALS — BP 164/95 | HR 106 | Temp 98.1°F | Ht 65.0 in | Wt 150.0 lb

## 2013-03-15 DIAGNOSIS — I10 Essential (primary) hypertension: Secondary | ICD-10-CM

## 2013-03-15 MED ORDER — LISINOPRIL-HYDROCHLOROTHIAZIDE 20-12.5 MG PO TABS: 2.0000 | ORAL_TABLET | Freq: Every day | ORAL | Status: DC

## 2013-03-15 NOTE — Patient Instructions (Signed)

## 2013-03-15 NOTE — Progress Notes (Signed)
  Subjective:    Patient ID: Deanna Becker, female    DOB: March 23, 1946, 67 y.o.   MRN: [id number]  HPI   Patient was seen on 02/03/13 for hospital folllow up- at that time blood pressure was elevated and patient was started on Lisinopril/hctz 20/25- Patient here today for blood pressure recheck- Blood pressure not been monitored at home.     Review of Systems  Respiratory: Negative for chest tightness and shortness of breath.   Cardiovascular: Negative for chest pain, palpitations and leg swelling.       Objective:   Physical Exam  Constitutional: She is oriented to person, place, and time. She appears well-developed and well-nourished.  Cardiovascular: Normal rate and normal heart sounds.   Pulmonary/Chest: Effort normal and breath sounds normal.  Neurological: She is alert and oriented to person, place, and time.    BP 164/95  Pulse 106  Temp(Src) 98.1 F (36.7 C) (Oral)  Ht 5\' 5"  (1.651 m)  Wt 150 lb (68.04 kg)  BMI 24.96 kg/m2       Assessment & Plan:  1. Hypertension Low Na+ diet Increases lisinopril to 20/12.5 2 PO QD RTO Friday for nurse to check blood pressure  Mary-Margaret Daphine Deutscher, FNP

## 2013-03-15 NOTE — Progress Notes (Signed)
Patient comes in for blood pressure check per Bennie Pierini, FNP.

## 2013-03-18 ENCOUNTER — Ambulatory Visit (INDEPENDENT_AMBULATORY_CARE_PROVIDER_SITE_OTHER): Payer: Medicare Other | Admitting: Nurse Practitioner

## 2013-03-18 ENCOUNTER — Encounter: Payer: Self-pay | Admitting: Nurse Practitioner

## 2013-03-18 VITALS — BP 159/92 | HR 94 | Temp 97.8°F | Ht 62.0 in | Wt 150.0 lb

## 2013-03-18 DIAGNOSIS — I10 Essential (primary) hypertension: Secondary | ICD-10-CM

## 2013-03-18 MED ORDER — AMLODIPINE BESYLATE 5 MG PO TABS: 5.0000 mg | ORAL_TABLET | Freq: Every day | ORAL | Status: DC

## 2013-03-18 NOTE — Progress Notes (Signed)
  Subjective:    Patient ID: Deanna Becker, female    DOB: Oct 11, 1945, 67 y.o.   MRN: [id number]  HPI Patient here today for blood pressure recheck- Was seen several days ago and changes made to blood pressure pills- Increased lisinopril to 20-12.5 2 PO QD- has only been taking for 3 days.    Review of Systems  All other systems reviewed and are negative.       Objective:   Physical Exam  Constitutional: She is oriented to person, place, and time. She appears well-developed and well-nourished.  HENT:  Nose: Nose normal.  Mouth/Throat: Oropharynx is clear and moist.  Eyes: EOM are normal.  Neck: Trachea normal, normal range of motion and full passive range of motion without pain. Neck supple. No JVD present. Carotid bruit is not present. No thyromegaly present.  Cardiovascular: Normal rate, regular rhythm, normal heart sounds and intact distal pulses.  Exam reveals no gallop and no friction rub.   No murmur heard. Pulmonary/Chest: Effort normal and breath sounds normal.  Abdominal: Soft. Bowel sounds are normal. She exhibits no distension and no mass. There is no tenderness.  Musculoskeletal: Normal range of motion.  Lymphadenopathy:    She has no cervical adenopathy.  Neurological: She is alert and oriented to person, place, and time. She has normal reflexes.  Skin: Skin is warm and dry.  Psychiatric: She has a normal mood and affect. Her behavior is normal. Judgment and thought content normal.     BP 159/92  Pulse 94  Temp(Src) 97.8 F (36.6 C)  Ht 5\' 2"  (1.575 m)  Wt 150 lb (68.04 kg)  BMI 27.43 kg/m2      Assessment & Plan:   1. Hypertension    Meds ordered this encounter  Medications  . amLODipine (NORVASC) 5 MG tablet    Sig: Take 1 tablet (5 mg total) by mouth daily.    Dispense:  90 tablet    Refill:  3    Order Specific Question:  Supervising Provider    Answer:  Deborra Medina   Low NA+ diet Exercise  RTO prn  Mary-Margaret Daphine Deutscher,  FNP

## 2013-04-06 ENCOUNTER — Encounter: Payer: Self-pay | Admitting: *Deleted

## 2013-05-23 ENCOUNTER — Ambulatory Visit: Payer: Medicare Other | Admitting: Nurse Practitioner

## 2013-05-26 ENCOUNTER — Ambulatory Visit: Payer: Medicare Other | Admitting: Nurse Practitioner

## 2013-05-30 ENCOUNTER — Encounter: Payer: Self-pay | Admitting: Nurse Practitioner

## 2013-05-30 ENCOUNTER — Ambulatory Visit (INDEPENDENT_AMBULATORY_CARE_PROVIDER_SITE_OTHER): Payer: Medicare Other | Admitting: Nurse Practitioner

## 2013-05-30 ENCOUNTER — Encounter (INDEPENDENT_AMBULATORY_CARE_PROVIDER_SITE_OTHER): Payer: Self-pay

## 2013-05-30 VITALS — BP 158/87 | HR 107 | Temp 97.9°F | Ht 62.0 in | Wt 153.5 lb

## 2013-05-30 DIAGNOSIS — E876 Hypokalemia: Secondary | ICD-10-CM

## 2013-05-30 DIAGNOSIS — Z23 Encounter for immunization: Secondary | ICD-10-CM

## 2013-05-30 DIAGNOSIS — I10 Essential (primary) hypertension: Secondary | ICD-10-CM

## 2013-05-30 DIAGNOSIS — E785 Hyperlipidemia, unspecified: Secondary | ICD-10-CM | POA: Insufficient documentation

## 2013-05-30 MED ORDER — LISINOPRIL-HYDROCHLOROTHIAZIDE 20-12.5 MG PO TABS: 2.0000 | ORAL_TABLET | Freq: Every day | ORAL | Status: DC

## 2013-05-30 NOTE — Progress Notes (Signed)
  Subjective:    Patient ID: Deanna Becker, female    DOB: [birth year], 67 y.o.   MRN: [id number]  Hypertension This is a chronic problem. The current episode started more than 1 year ago. The problem is unchanged. The problem is uncontrolled (blood pressure always elevated at office). Pertinent negatives include no blurred vision, chest pain, headaches, malaise/fatigue, orthopnea, palpitations, peripheral edema or sweats. There are no associated agents to hypertension. Risk factors for coronary artery disease include dyslipidemia, family history and post-menopausal state. Past treatments include ACE inhibitors, diuretics and calcium channel blockers. The current treatment provides moderate improvement. Compliance problems include diet and exercise.   Hyperlipidemia This is a chronic problem. The current episode started more than 1 year ago. The problem is controlled. Recent lipid tests were reviewed and are high. She has no history of diabetes, hypothyroidism or obesity. Pertinent negatives include no chest pain. Current antihyperlipidemic treatment includes statins. Risk factors for coronary artery disease include family history, hypertension and post-menopausal.      Review of Systems  Constitutional: Negative for malaise/fatigue.  Eyes: Negative for blurred vision.  Cardiovascular: Negative for chest pain, palpitations and orthopnea.  Neurological: Negative for headaches.       Objective:   Physical Exam  Constitutional: She is oriented to person, place, and time. She appears well-developed and well-nourished.  HENT:  Nose: Nose normal.  Mouth/Throat: Oropharynx is clear and moist.  Eyes: EOM are normal.  Neck: Trachea normal, normal range of motion and full passive range of motion without pain. Neck supple. No JVD present. Carotid bruit is not present. No thyromegaly present.  Cardiovascular: Normal rate, regular rhythm, normal heart sounds and intact distal pulses.  Exam reveals no  gallop and no friction rub.   No murmur heard. Pulmonary/Chest: Effort normal and breath sounds normal.  Abdominal: Soft. Bowel sounds are normal. She exhibits no distension and no mass. There is no tenderness.  Musculoskeletal: Normal range of motion.  Lymphadenopathy:    She has no cervical adenopathy.  Neurological: She is alert and oriented to person, place, and time. She has normal reflexes.  Skin: Skin is warm and dry.  Psychiatric: She has a normal mood and affect. Her behavior is normal. Judgment and thought content normal.   BP 158/87  Pulse 107  Temp(Src) 97.9 F (36.6 C) (Oral)  Ht 5\' 2"  (1.575 m)  Wt 153 lb 8 oz (69.627 kg)  BMI 28.07 kg/m2        Assessment & Plan:   1. Hypertension   2. Hypokalemia   3. Hyperlipidemia LDL goal < 100    Orders Placed This Encounter  Procedures  . CMP14+EGFR  . NMR, lipoprofile    Meds ordered this encounter  Medications  . lisinopril-hydrochlorothiazide (ZESTORETIC) 20-12.5 MG per tablet    Sig: Take 2 tablets by mouth daily.    Dispense:  60 tablet    Refill:  3    Order Specific Question:  Supervising Provider    Answer:  Ernestina Penna [[id number]]   Patient was only taking 1 lisinopril/Hctz daily increased to 2 a day Continue all meds Labs pending Diet and exercise encouraged Health maintenance reviewed Follow up in 3 months Flu and pneumonia vaccine today  Mary-Margaret Daphine Deutscher, FNP

## 2013-05-30 NOTE — Patient Instructions (Signed)

## 2013-06-01 LAB — CMP14+EGFR
AST: 14 IU/L (ref 0–40)
Albumin/Globulin Ratio: 2 (ref 1.1–2.5)
Alkaline Phosphatase: 76 IU/L (ref 39–117)
BUN/Creatinine Ratio: 26 (ref 11–26)
Creatinine, Ser: 0.61 mg/dL (ref 0.57–1.00)
GFR calc non Af Amer: 95 mL/min/{1.73_m2} (ref >59)
Globulin, Total: 2.4 g/dL (ref 1.5–4.5)
Sodium: 141 mmol/L (ref 134–144)
Total Bilirubin: 0.8 mg/dL (ref 0.0–1.2)

## 2013-06-01 LAB — NMR, LIPOPROFILE
HDL Particle Number: 49.3 umol/L (ref >=30.5)
LDL Size: 20.7 nm (ref >20.5)
LP-IR Score: 43 (ref <=45)
Small LDL Particle Number: 411 nmol/L (ref <=527)

## 2013-08-17 ENCOUNTER — Other Ambulatory Visit: Payer: Self-pay | Admitting: Nurse Practitioner

## 2013-08-17 MED ORDER — HYDROCHLOROTHIAZIDE 25 MG PO TABS: 25.0000 mg | ORAL_TABLET | Freq: Every day | ORAL | Status: DC

## 2013-08-17 MED ORDER — LISINOPRIL 40 MG PO TABS: 40.0000 mg | ORAL_TABLET | Freq: Every day | ORAL | Status: DC

## 2013-08-17 NOTE — Progress Notes (Signed)
Patient aware.

## 2013-08-31 ENCOUNTER — Encounter: Payer: Self-pay | Admitting: Nurse Practitioner

## 2013-08-31 ENCOUNTER — Ambulatory Visit (INDEPENDENT_AMBULATORY_CARE_PROVIDER_SITE_OTHER): Payer: Medicare Other | Admitting: Nurse Practitioner

## 2013-08-31 VITALS — BP 143/82 | HR 116 | Temp 97.7°F | Ht 62.0 in | Wt 154.0 lb

## 2013-08-31 DIAGNOSIS — I10 Essential (primary) hypertension: Secondary | ICD-10-CM

## 2013-08-31 DIAGNOSIS — J069 Acute upper respiratory infection, unspecified: Secondary | ICD-10-CM

## 2013-08-31 DIAGNOSIS — E785 Hyperlipidemia, unspecified: Secondary | ICD-10-CM

## 2013-08-31 DIAGNOSIS — Z23 Encounter for immunization: Secondary | ICD-10-CM

## 2013-08-31 NOTE — Progress Notes (Signed)
  Subjective:    Patient ID: Deanna Becker, female    DOB: 11-11-1945, 68 y.o.   MRN: [id number]  Hypertension This is a chronic problem. The current episode started more than 1 year ago. The problem is unchanged. The problem is uncontrolled (blood pressure always elevated at office). Pertinent negatives include no blurred vision, chest pain, headaches, malaise/fatigue, orthopnea, palpitations, peripheral edema, shortness of breath or sweats. There are no associated agents to hypertension. Risk factors for coronary artery disease include dyslipidemia, family history and post-menopausal state. Past treatments include ACE inhibitors, diuretics and calcium channel blockers. The current treatment provides moderate improvement. Compliance problems include diet and exercise.   Hyperlipidemia This is a chronic problem. The current episode started more than 1 year ago. The problem is controlled. Recent lipid tests were reviewed and are high. She has no history of diabetes, hypothyroidism or obesity. Pertinent negatives include no chest pain or shortness of breath. Current antihyperlipidemic treatment includes statins. Risk factors for coronary artery disease include family history, hypertension and post-menopausal.   * SLIGHT COLD LIKE SYMPTOMS FOR SEVERAL DAYS.   Review of Systems  Constitutional: Negative for fever, chills, malaise/fatigue and appetite change.  HENT: Positive for congestion, rhinorrhea and sore throat.   Eyes: Negative for blurred vision.  Respiratory: Negative for cough and shortness of breath.   Cardiovascular: Negative for chest pain, palpitations and orthopnea.  Gastrointestinal: Negative for diarrhea and constipation.  Neurological: Negative for headaches.  All other systems reviewed and are negative.       Objective:   Physical Exam  Constitutional: She is oriented to person, place, and time. She appears well-developed and well-nourished.  HENT:  Nose: Mucosal edema and  rhinorrhea present.  Mouth/Throat: Oropharynx is clear and moist.  Eyes: EOM are normal.  Neck: Trachea normal, normal range of motion and full passive range of motion without pain. Neck supple. No JVD present. Carotid bruit is not present. No thyromegaly present.  Cardiovascular: Normal rate, regular rhythm, normal heart sounds and intact distal pulses.  Exam reveals no gallop and no friction rub.   No murmur heard. Pulmonary/Chest: Effort normal and breath sounds normal.  Abdominal: Soft. Bowel sounds are normal. She exhibits no distension and no mass. There is no tenderness.  Musculoskeletal: Normal range of motion.  Lymphadenopathy:    She has no cervical adenopathy.  Neurological: She is alert and oriented to person, place, and time. She has normal reflexes.  Skin: Skin is warm and dry.  Psychiatric: She has a normal mood and affect. Her behavior is normal. Judgment and thought content normal.     BP 143/82  Pulse 116  Temp(Src) 97.7 F (36.5 C) (Oral)  Ht _0  (1.575 m)  Wt 154 lb (69.854 kg)  BMI 28.16 kg/m2         Assessment & Plan:   1. Hypertension   2. Hyperlipidemia LDL goal < 100   3. Viral upper respiratory tract infection    Orders Placed This Encounter  Procedures  . CMP14+EGFR  . NMR, lipoprofile    Hemoccult cards given to pateint Labs pending Health maintenance reviewed Diet and exercise encouraged Continue all meds Follow up  In 3 months   [place], FNP

## 2013-08-31 NOTE — Patient Instructions (Signed)

## 2013-09-01 LAB — NMR, LIPOPROFILE
CHOLESTEROL: 152 mg/dL (ref <200)
HDL Cholesterol by NMR: 68 mg/dL (ref >=40)
HDL Particle Number: 43.3 umol/L (ref >=30.5)
LDL PARTICLE NUMBER: 822 nmol/L (ref <1000)
LDL Size: 20.4 nm — ABNORMAL LOW (ref >20.5)
LDLC SERPL CALC-MCNC: 57 mg/dL (ref <100)
LP-IR Score: 31 (ref <=45)
Small LDL Particle Number: 366 nmol/L (ref <=527)
TRIGLYCERIDES BY NMR: 135 mg/dL (ref <150)

## 2013-09-01 LAB — CMP14+EGFR
ALBUMIN: 4.8 g/dL (ref 3.6–4.8)
ALT: 132 IU/L — ABNORMAL HIGH (ref 0–32)
AST: 61 IU/L — ABNORMAL HIGH (ref 0–40)
Albumin/Globulin Ratio: 1.8 (ref 1.1–2.5)
Alkaline Phosphatase: 79 IU/L (ref 39–117)
BUN/Creatinine Ratio: 20 (ref 11–26)
BUN: 14 mg/dL (ref 8–27)
CHLORIDE: 98 mmol/L (ref 97–108)
CO2: 24 mmol/L (ref 18–29)
CREATININE: 0.71 mg/dL (ref 0.57–1.00)
Calcium: 9.9 mg/dL (ref 8.7–10.3)
GFR calc Af Amer: 103 mL/min/{1.73_m2} (ref >59)
GFR calc non Af Amer: 89 mL/min/{1.73_m2} (ref >59)
GLOBULIN, TOTAL: 2.6 g/dL (ref 1.5–4.5)
GLUCOSE: 108 mg/dL — AB (ref 65–99)
Potassium: 3.8 mmol/L (ref 3.5–5.2)
Sodium: 138 mmol/L (ref 134–144)
TOTAL PROTEIN: 7.4 g/dL (ref 6.0–8.5)
Total Bilirubin: 0.7 mg/dL (ref 0.0–1.2)

## 2013-11-08 ENCOUNTER — Other Ambulatory Visit: Payer: Medicare Other

## 2013-11-08 DIAGNOSIS — Z1212 Encounter for screening for malignant neoplasm of rectum: Secondary | ICD-10-CM

## 2013-11-08 NOTE — Progress Notes (Signed)
Patient dropped off fobt 

## 2013-11-08 NOTE — Addendum Note (Signed)
Addended by: Prescott GumLAND, [place] M on: 11/08/2013 01:19 PM   Modules accepted: Orders

## 2013-11-09 LAB — FECAL OCCULT BLOOD, IMMUNOCHEMICAL: Fecal Occult Bld: NEGATIVE

## 2013-12-12 ENCOUNTER — Encounter: Payer: Self-pay | Admitting: Nurse Practitioner

## 2013-12-12 ENCOUNTER — Ambulatory Visit (INDEPENDENT_AMBULATORY_CARE_PROVIDER_SITE_OTHER): Payer: Medicare Other | Admitting: Nurse Practitioner

## 2013-12-12 VITALS — BP 158/89 | HR 120 | Temp 96.5°F | Ht 62.0 in | Wt 159.0 lb

## 2013-12-12 DIAGNOSIS — I10 Essential (primary) hypertension: Secondary | ICD-10-CM

## 2013-12-12 DIAGNOSIS — E785 Hyperlipidemia, unspecified: Secondary | ICD-10-CM

## 2013-12-12 MED ORDER — LISINOPRIL 40 MG PO TABS: 40.0000 mg | ORAL_TABLET | Freq: Every day | ORAL | Status: DC

## 2013-12-12 MED ORDER — AMLODIPINE BESYLATE 10 MG PO TABS: 10.0000 mg | ORAL_TABLET | Freq: Every day | ORAL | Status: DC

## 2013-12-12 MED ORDER — SIMVASTATIN 40 MG PO TABS: 40.0000 mg | ORAL_TABLET | Freq: Every day | ORAL | Status: DC

## 2013-12-12 MED ORDER — ASPIRIN EC 81 MG PO TBEC: 81.0000 mg | DELAYED_RELEASE_TABLET | Freq: Every day | ORAL | Status: AC

## 2013-12-12 MED ORDER — HYDROCHLOROTHIAZIDE 25 MG PO TABS: 25.0000 mg | ORAL_TABLET | Freq: Every day | ORAL | Status: DC

## 2013-12-12 NOTE — Progress Notes (Signed)
Subjective:    Patient ID: Deanna Becker, female    DOB: [birth year], 68 y.o.   MRN: [id number]  Patient here today for follow up of chronic medical problems.  Hypertension This is a chronic problem. The current episode started more than 1 year ago. The problem is unchanged. The problem is uncontrolled (blood pressure always elevated at office). Pertinent negatives include no blurred vision, chest pain, headaches, malaise/fatigue, orthopnea, palpitations, peripheral edema, shortness of breath or sweats. There are no associated agents to hypertension. Risk factors for coronary artery disease include dyslipidemia, family history and post-menopausal state. Past treatments include ACE inhibitors, diuretics and calcium channel blockers. The current treatment provides moderate improvement. Compliance problems include diet and exercise.   Hyperlipidemia This is a chronic problem. The current episode started more than 1 year ago. The problem is controlled. Recent lipid tests were reviewed and are high. She has no history of diabetes, hypothyroidism or obesity. Pertinent negatives include no chest pain or shortness of breath. Current antihyperlipidemic treatment includes statins. Risk factors for coronary artery disease include family history, hypertension and post-menopausal.      Review of Systems  Constitutional: Negative for fever, chills, malaise/fatigue and appetite change.  HENT: Positive for congestion, rhinorrhea and sore throat.   Eyes: Negative for blurred vision.  Respiratory: Negative for cough and shortness of breath.   Cardiovascular: Negative for chest pain, palpitations and orthopnea.  Gastrointestinal: Negative for diarrhea and constipation.  Neurological: Negative for headaches.  All other systems reviewed and are negative.      Objective:   Physical Exam  Constitutional: She is oriented to person, place, and time. She appears well-developed and well-nourished.  HENT:  Nose:  Mucosal edema and rhinorrhea present.  Mouth/Throat: Oropharynx is clear and moist.  Eyes: EOM are normal.  Neck: Trachea normal, normal range of motion and full passive range of motion without pain. Neck supple. No JVD present. Carotid bruit is not present. No thyromegaly present.  Cardiovascular: Normal rate, regular rhythm, normal heart sounds and intact distal pulses.  Exam reveals no gallop and no friction rub.   No murmur heard. Pulmonary/Chest: Effort normal and breath sounds normal.  Abdominal: Soft. Bowel sounds are normal. She exhibits no distension and no mass. There is no tenderness.  Musculoskeletal: Normal range of motion.  Lymphadenopathy:    She has no cervical adenopathy.  Neurological: She is alert and oriented to person, place, and time. She has normal reflexes.  Skin: Skin is warm and dry.  Psychiatric: She has a normal mood and affect. Her behavior is normal. Judgment and thought content normal.     BP 158/89  Pulse 120  Temp(Src) 96.5 F (35.8 C) (Oral)  Ht '5\' 2"'  (1.575 m)  Wt 159 lb (72.122 kg)  BMI 29.07 kg/m2         Assessment & Plan:   1. Hypertension   2. Hyperlipidemia LDL goal < 100    Orders Placed This Encounter  Procedures  . CMP14+EGFR  . NMR, lipoprofile   Meds ordered this encounter  Medications  . amLODipine (NORVASC) 10 MG tablet    Sig: Take 1 tablet (10 mg total) by mouth daily.    Dispense:  90 tablet    Refill:  1    Order Specific Question:  Supervising Provider    Answer:  Chipper Herb [[id number]]  . hydrochlorothiazide (HYDRODIURIL) 25 MG tablet    Sig: Take 1 tablet (25 mg total) by mouth daily.    Dispense:  90 tablet    Refill:  1    Order Specific Question:  Supervising Provider    Answer:  Chipper Herb [[id number]]  . lisinopril (PRINIVIL,ZESTRIL) 40 MG tablet    Sig: Take 1 tablet (40 mg total) by mouth daily.    Dispense:  90 tablet    Refill:  1    Order Specific Question:  Supervising Provider    Answer:   Chipper Herb [[id number]]  . simvastatin (ZOCOR) 40 MG tablet    Sig: Take 1 tablet (40 mg total) by mouth at bedtime.    Dispense:  90 tablet    Refill:  1    Order Specific Question:  Supervising Provider    Answer:  Chipper Herb [[id number]]  . aspirin EC 81 MG tablet    Sig: Take 1 tablet (81 mg total) by mouth daily.    Dispense:  30 tablet    Refill:  0    Order Specific Question:  Supervising Provider    Answer:  Joycelyn Man   Increased amlodopine to 66m  Daily- call if lower ext edema develops Keep check of blood pressure at home Labs pending Health maintenance reviewed Diet and exercise encouraged Continue all meds Follow up  In 3 months   [place] FNP

## 2013-12-12 NOTE — Patient Instructions (Signed)

## 2013-12-13 LAB — NMR, LIPOPROFILE
CHOLESTEROL: 167 mg/dL (ref <200)
HDL Cholesterol by NMR: 79 mg/dL (ref >=40)
HDL PARTICLE NUMBER: 42.8 umol/L (ref >=30.5)
LDL Particle Number: 839 nmol/L (ref <1000)
LDL SIZE: 20.4 nm (ref >20.5)
LDLC SERPL CALC-MCNC: 69 mg/dL (ref <100)
Small LDL Particle Number: 391 nmol/L (ref <=527)
Triglycerides by NMR: 95 mg/dL (ref <150)

## 2013-12-13 LAB — CMP14+EGFR
A/G RATIO: 2.3 (ref 1.1–2.5)
ALBUMIN: 4.6 g/dL (ref 3.6–4.8)
ALK PHOS: 90 IU/L (ref 39–117)
ALT: 111 IU/L — ABNORMAL HIGH (ref 0–32)
AST: 71 IU/L — ABNORMAL HIGH (ref 0–40)
BUN / CREAT RATIO: 28 — AB (ref 11–26)
BUN: 18 mg/dL (ref 8–27)
CO2: 25 mmol/L (ref 18–29)
CREATININE: 0.65 mg/dL (ref 0.57–1.00)
Calcium: 9.6 mg/dL (ref 8.7–10.3)
Chloride: 101 mmol/L (ref 97–108)
GFR calc non Af Amer: 93 mL/min/{1.73_m2} (ref >59)
GFR, EST AFRICAN AMERICAN: 107 mL/min/{1.73_m2} (ref >59)
GLOBULIN, TOTAL: 2 g/dL (ref 1.5–4.5)
Glucose: 110 mg/dL — ABNORMAL HIGH (ref 65–99)
Potassium: 4 mmol/L (ref 3.5–5.2)
SODIUM: 139 mmol/L (ref 134–144)
Total Bilirubin: 1.8 mg/dL — ABNORMAL HIGH (ref 0.0–1.2)
Total Protein: 6.6 g/dL (ref 6.0–8.5)

## 2014-03-17 ENCOUNTER — Ambulatory Visit: Payer: Medicare Other | Admitting: Nurse Practitioner

## 2014-03-20 ENCOUNTER — Encounter: Payer: Self-pay | Admitting: *Deleted

## 2014-04-20 ENCOUNTER — Ambulatory Visit (INDEPENDENT_AMBULATORY_CARE_PROVIDER_SITE_OTHER): Payer: Medicare Other | Admitting: Nurse Practitioner

## 2014-04-20 ENCOUNTER — Encounter: Payer: Self-pay | Admitting: Nurse Practitioner

## 2014-04-20 VITALS — BP 156/83 | HR 112 | Temp 97.3°F | Ht 62.0 in | Wt 163.4 lb

## 2014-04-20 DIAGNOSIS — Z6829 Body mass index (BMI) 29.0-29.9, adult: Secondary | ICD-10-CM

## 2014-04-20 DIAGNOSIS — I10 Essential (primary) hypertension: Secondary | ICD-10-CM

## 2014-04-20 DIAGNOSIS — R000 Tachycardia, unspecified: Secondary | ICD-10-CM

## 2014-04-20 DIAGNOSIS — E785 Hyperlipidemia, unspecified: Secondary | ICD-10-CM

## 2014-04-20 MED ORDER — HYDROCHLOROTHIAZIDE 25 MG PO TABS: 25.0000 mg | ORAL_TABLET | Freq: Every day | ORAL | Status: DC

## 2014-04-20 MED ORDER — SIMVASTATIN 40 MG PO TABS: 40.0000 mg | ORAL_TABLET | Freq: Every day | ORAL | Status: DC

## 2014-04-20 MED ORDER — METOPROLOL TARTRATE 50 MG PO TABS: 50.0000 mg | ORAL_TABLET | Freq: Two times a day (BID) | ORAL | Status: DC

## 2014-04-20 MED ORDER — LISINOPRIL 40 MG PO TABS: 40.0000 mg | ORAL_TABLET | Freq: Every day | ORAL | Status: DC

## 2014-04-20 NOTE — Patient Instructions (Signed)
Nonspecific Tachycardia  Tachycardia is a faster than normal heartbeat (more than 100 beats per minute). In adults, the heart normally beats between 60 and 100 times a minute. A fast heartbeat may be a normal response to exercise or stress. It does not necessarily mean that something is wrong. However, sometimes when your heart beats too fast it may not be able to pump enough blood to the rest of your body. This can result in chest pain, shortness of breath, dizziness, and even fainting. Nonspecific tachycardia means that the specific cause or pattern of your tachycardia is unknown.  CAUSES   Tachycardia may be harmless or it may be due to a more serious underlying cause. Possible causes of tachycardia include:  · Exercise or exertion.  · Fever.  · Pain or injury.  · Infection.  · Loss of body fluids (dehydration).  · Overactive thyroid.  · Lack of red blood cells (anemia).  · Anxiety and stress.  · Alcohol.  · Caffeine.  · Tobacco products.  · Diet pills.  · Illegal drugs.  · Heart disease.  SYMPTOMS  · Rapid or irregular heartbeat (palpitations).  · Suddenly feeling your heart beating (cardiac awareness).  · Dizziness.  · Tiredness (fatigue).  · Shortness of breath.  · Chest pain.  · Nausea.  · Fainting.  DIAGNOSIS   Your caregiver will perform a physical exam and take your medical history. In some cases, a heart specialist (cardiologist) may be consulted. Your caregiver may also order:  · Blood tests.  · Electrocardiography. This test records the electrical activity of your heart.  · A heart monitoring test.  TREATMENT   Treatment will depend on the likely cause of your tachycardia. The goal is to treat the underlying cause of your tachycardia. Treatment methods may include:  · Replacement of fluids or blood through an intravenous (IV) tube for moderate to severe dehydration or anemia.  · New medicines or changes in your current medicines.  · Diet and lifestyle changes.  · Treatment for certain  infections.  · Stress relief or relaxation methods.  HOME CARE INSTRUCTIONS   · Rest.  · Drink enough fluids to keep your urine clear or pale yellow.  · Do not smoke.  · Avoid:  ¨ Caffeine.  ¨ Tobacco.  ¨ Alcohol.  ¨ Chocolate.  ¨ Stimulants such as over-the-counter diet pills or pills that help you stay awake.  ¨ Situations that cause anxiety or stress.  ¨ Illegal drugs such as marijuana, phencyclidine (PCP), and cocaine.  · Only take medicine as directed by your caregiver.  · Keep all follow-up appointments as directed by your caregiver.  SEEK IMMEDIATE MEDICAL CARE IF:   · You have pain in your chest, upper arms, jaw, or neck.  · You become weak, dizzy, or feel faint.  · You have palpitations that will not go away.  · You vomit, have diarrhea, or pass blood in your stool.  · Your skin is cool, pale, and wet.  · You have a fever that will not go away with rest, fluids, and medicine.  MAKE SURE YOU:   · Understand these instructions.  · Will watch your condition.  · Will get help right away if you are not doing well or get worse.  Document Released: 09/04/2004 Document Revised: 10/20/2011 Document Reviewed: 07/08/2011  [employer]® Patient Information ©2015 [employer]. This information is not intended to replace advice given to you by your health care provider. Make sure you discuss any questions   you have with your health care provider.

## 2014-04-20 NOTE — Progress Notes (Signed)
Subjective:    Patient ID: Deanna Becker, female    DOB: 20-Aug-1945, 68 y.o.   MRN: [id number]  Patient here today for follow up of chronic medical problems.  Was not able to take Norvasc 28m due to swelling in bilateral feet and ankles.  Is taking Norvasc 563mcurrently with no issues.  Hypertension This is a chronic problem. The current episode started more than 1 year ago. The problem is unchanged. The problem is uncontrolled (blood pressure always elevated at office). Pertinent negatives include no blurred vision, chest pain, headaches, malaise/fatigue, orthopnea, palpitations, peripheral edema, shortness of breath or sweats. There are no associated agents to hypertension. Risk factors for coronary artery disease include dyslipidemia, family history and post-menopausal state. Past treatments include ACE inhibitors, diuretics and calcium channel blockers. The current treatment provides moderate improvement. Compliance problems include diet and exercise.   Hyperlipidemia This is a chronic problem. The current episode started more than 1 year ago. The problem is controlled. Recent lipid tests were reviewed and are high. She has no history of diabetes, hypothyroidism or obesity. Pertinent negatives include no chest pain or shortness of breath. Current antihyperlipidemic treatment includes statins. Risk factors for coronary artery disease include family history, hypertension and post-menopausal.      Review of Systems  Constitutional: Negative for fever, chills, malaise/fatigue and appetite change.  Eyes: Negative for blurred vision.  Respiratory: Negative for cough and shortness of breath.   Cardiovascular: Negative for chest pain, palpitations and orthopnea.  Gastrointestinal: Negative for diarrhea and constipation.  Neurological: Negative for headaches.  All other systems reviewed and are negative.      Objective:   Physical Exam  Constitutional: She is oriented to person, place, and  time. She appears well-developed and well-nourished.  HENT:  Mouth/Throat: Oropharynx is clear and moist.  Eyes: EOM are normal.  Neck: Trachea normal, normal range of motion and full passive range of motion without pain. Neck supple. No JVD present. Carotid bruit is not present. No thyromegaly present.  Cardiovascular: Normal rate, regular rhythm and intact distal pulses.  Exam reveals no gallop and no friction rub.   Murmur (2/6 systolic) heard. Pulmonary/Chest: Effort normal and breath sounds normal.  Abdominal: Soft. Bowel sounds are normal. She exhibits no distension and no mass. There is no tenderness.  Musculoskeletal: Normal range of motion.  Lymphadenopathy:    She has no cervical adenopathy.  Neurological: She is alert and oriented to person, place, and time. She has normal reflexes.  Skin: Skin is warm and dry.  Psychiatric: She has a normal mood and affect. Her behavior is normal. Judgment and thought content normal.     BP 156/83  Pulse 112  Temp(Src) 97.3 F (36.3 C) (Oral)  Ht '5\' 2"'  (1.575 m)  Wt 163 lb 6.4 oz (74.118 kg)  BMI 29.88 kg/m2  EKG- sinus taMerideth AbbeyFNP        Assessment & Plan:   1. Essential hypertension   2. Hyperlipidemia with target LDL less than 100   3. Tachycardia   4. Severe obesity (BMI >= 40)   5. BMI 29.0-29.9,adult    Orders Placed This Encounter  Procedures  . CMP14+EGFR  . NMR, lipoprofile  . EKG 12-Lead   Meds ordered this encounter  Medications  . hydrochlorothiazide (HYDRODIURIL) 25 MG tablet    Sig: Take 1 tablet (25 mg total) by mouth daily.    Dispense:  90 tablet    Refill:  1    Order Specific Question:  Supervising Provider    Answer:  Chipper Herb [[id number]]  . lisinopril (PRINIVIL,ZESTRIL) 40 MG tablet    Sig: Take 1 tablet (40 mg total) by mouth daily.    Dispense:  90 tablet    Refill:  1    Order Specific Question:  Supervising Provider    Answer:  Chipper Herb [[id number]]  .  simvastatin (ZOCOR) 40 MG tablet    Sig: Take 1 tablet (40 mg total) by mouth at bedtime.    Dispense:  90 tablet    Refill:  1    Order Specific Question:  Supervising Provider    Answer:  Chipper Herb [[id number]]  . metoprolol (LOPRESSOR) 50 MG tablet    Sig: Take 1 tablet (50 mg total) by mouth 2 (two) times daily.    Dispense:  180 tablet    Refill:  1    Order Specific Question:  Supervising Provider    Answer:  Laurance Flatten, DONALD W [[id number]]   Stopped amlodipine- added metoprolol 50 mg BID- keep diary of blood pressures at home Discussed weight management for patient with BMI> 25 Labs pending Health maintenance reviewed Diet and exercise encouraged Continue all meds Follow up  In 3 months   [place], FNP

## 2014-04-21 LAB — CMP14+EGFR
ALT: 67 IU/L — AB (ref 0–32)
AST: 42 IU/L — AB (ref 0–40)
Albumin/Globulin Ratio: 1.6 (ref 1.1–2.5)
Albumin: 4.6 g/dL (ref 3.6–4.8)
Alkaline Phosphatase: 84 IU/L (ref 39–117)
BUN/Creatinine Ratio: 22 (ref 11–26)
BUN: 16 mg/dL (ref 8–27)
CALCIUM: 9.7 mg/dL (ref 8.7–10.3)
CO2: 24 mmol/L (ref 18–29)
Chloride: 99 mmol/L (ref 97–108)
Creatinine, Ser: 0.72 mg/dL (ref 0.57–1.00)
GFR calc Af Amer: 101 mL/min/{1.73_m2} (ref >59)
GFR calc non Af Amer: 88 mL/min/{1.73_m2} (ref >59)
GLUCOSE: 100 mg/dL — AB (ref 65–99)
Globulin, Total: 2.9 g/dL (ref 1.5–4.5)
POTASSIUM: 3.9 mmol/L (ref 3.5–5.2)
SODIUM: 141 mmol/L (ref 134–144)
Total Bilirubin: 1.5 mg/dL — ABNORMAL HIGH (ref 0.0–1.2)
Total Protein: 7.5 g/dL (ref 6.0–8.5)

## 2014-04-21 LAB — NMR, LIPOPROFILE
Cholesterol: 164 mg/dL (ref 100–199)
HDL CHOLESTEROL BY NMR: 81 mg/dL (ref >39)
HDL Particle Number: 43.7 umol/L (ref >=30.5)
LDL Particle Number: 658 nmol/L (ref <1000)
LDL Size: 21 nm (ref >20.5)
LDLC SERPL CALC-MCNC: 63 mg/dL (ref 0–99)
LP-IR Score: <25 (ref <=45)
Small LDL Particle Number: <90 nmol/L (ref <=527)
Triglycerides by NMR: 100 mg/dL (ref 0–149)

## 2014-07-24 ENCOUNTER — Ambulatory Visit (INDEPENDENT_AMBULATORY_CARE_PROVIDER_SITE_OTHER): Payer: Medicare Other | Admitting: Nurse Practitioner

## 2014-07-24 ENCOUNTER — Encounter: Payer: Self-pay | Admitting: Nurse Practitioner

## 2014-07-24 VITALS — BP 198/96 | HR 84 | Temp 98.0°F | Ht 62.0 in | Wt 163.0 lb

## 2014-07-24 DIAGNOSIS — E785 Hyperlipidemia, unspecified: Secondary | ICD-10-CM

## 2014-07-24 DIAGNOSIS — I10 Essential (primary) hypertension: Secondary | ICD-10-CM

## 2014-07-24 MED ORDER — METOPROLOL TARTRATE 100 MG PO TABS: 100.0000 mg | ORAL_TABLET | Freq: Two times a day (BID) | ORAL | Status: DC

## 2014-07-24 NOTE — Patient Instructions (Signed)

## 2014-07-24 NOTE — Progress Notes (Signed)
  Subjective:    Patient ID: Deanna Becker, female    DOB: Dec 19, 1945, 68 y.o.   MRN: [id number]  Patient here today for follow up of chronic medical problems.     Hypertension This is a chronic problem. The current episode started more than 1 year ago. The problem is unchanged. The problem is uncontrolled. Pertinent negatives include no chest pain, headaches, palpitations or shortness of breath. Risk factors for coronary artery disease include dyslipidemia. Past treatments include ACE inhibitors, diuretics and beta blockers. The current treatment provides mild improvement.  Hyperlipidemia This is a chronic problem. The current episode started more than 1 year ago. The problem is uncontrolled. Recent lipid tests were reviewed and are normal. Pertinent negatives include no chest pain or shortness of breath. Current antihyperlipidemic treatment includes statins. The current treatment provides mild improvement of lipids. Compliance problems include adherence to diet and adherence to exercise.  Risk factors for coronary artery disease include dyslipidemia and hypertension.      Review of Systems  Constitutional: Negative for fever, chills and appetite change.  Respiratory: Negative for cough and shortness of breath.   Cardiovascular: Negative for chest pain and palpitations.  Gastrointestinal: Negative for diarrhea and constipation.  Neurological: Negative for headaches.  All other systems reviewed and are negative.      Objective:   Physical Exam  Constitutional: She is oriented to person, place, and time. She appears well-developed and well-nourished.  HENT:  Nose: Nose normal.  Mouth/Throat: Oropharynx is clear and moist.  Eyes: EOM are normal.  Neck: Trachea normal, normal range of motion and full passive range of motion without pain. Neck supple. No JVD present. Carotid bruit is not present. No thyromegaly present.  Cardiovascular: Normal rate, regular rhythm, normal heart sounds  and intact distal pulses.  Exam reveals no gallop and no friction rub.   No murmur heard. Pulmonary/Chest: Effort normal and breath sounds normal.  Abdominal: Soft. Bowel sounds are normal. She exhibits no distension and no mass. There is no tenderness.  Musculoskeletal: Normal range of motion.  Lymphadenopathy:    She has no cervical adenopathy.  Neurological: She is alert and oriented to person, place, and time. She has normal reflexes.  Skin: Skin is warm and dry.  Psychiatric: She has a normal mood and affect. Her behavior is normal. Judgment and thought content normal.     BP 198/96 mmHg  Pulse 84  Temp(Src) 98 F (36.7 C) (Oral)  Ht $R'5\' 2"'aG$  (1.575 m)  Wt 163 lb (73.936 kg)  BMI 29.81 kg/m2         Assessment & Plan:   1. Essential hypertension Do not add salt to diet Increased metoprolol to $RemoveBefor'100mg'TusFSOCHvgFX$  BID - CMP14+EGFR - metoprolol (LOPRESSOR) 100 MG tablet; Take 1 tablet (100 mg total) by mouth 2 (two) times daily.  Dispense: 180 tablet; Refill: 1  2. Hyperlipidemia with target LDL less than 100 Low fat diet - NMR, lipoprofile   Refuses mammogram and dexa scan Labs pending Health maintenance reviewed Diet and exercise encouraged Continue all meds Follow up  In 3 month   [place], FNP

## 2014-07-25 LAB — CMP14+EGFR
A/G RATIO: 1.5 (ref 1.1–2.5)
ALBUMIN: 4.4 g/dL (ref 3.6–4.8)
ALT: 28 IU/L (ref 0–32)
AST: 29 IU/L (ref 0–40)
Alkaline Phosphatase: 71 IU/L (ref 39–117)
BILIRUBIN TOTAL: 0.8 mg/dL (ref 0.0–1.2)
BUN/Creatinine Ratio: 18 (ref 11–26)
BUN: 13 mg/dL (ref 8–27)
CO2: 23 mmol/L (ref 18–29)
CREATININE: 0.72 mg/dL (ref 0.57–1.00)
Calcium: 9.2 mg/dL (ref 8.7–10.3)
Chloride: 100 mmol/L (ref 97–108)
GFR, EST AFRICAN AMERICAN: 100 mL/min/{1.73_m2} (ref >59)
GFR, EST NON AFRICAN AMERICAN: 87 mL/min/{1.73_m2} (ref >59)
GLOBULIN, TOTAL: 3 g/dL (ref 1.5–4.5)
Glucose: 116 mg/dL — ABNORMAL HIGH (ref 65–99)
Potassium: 4.1 mmol/L (ref 3.5–5.2)
Sodium: 137 mmol/L (ref 134–144)
Total Protein: 7.4 g/dL (ref 6.0–8.5)

## 2014-07-25 LAB — NMR, LIPOPROFILE
CHOLESTEROL: 166 mg/dL (ref 100–199)
HDL Cholesterol by NMR: 74 mg/dL (ref >39)
HDL Particle Number: 42.2 umol/L (ref >=30.5)
LDL Particle Number: 864 nmol/L (ref <1000)
LDL SIZE: 21 nm (ref >20.5)
LDL-C: 75 mg/dL (ref 0–99)
LP-IR Score: 25 (ref <=45)
Small LDL Particle Number: 315 nmol/L (ref <=527)
TRIGLYCERIDES BY NMR: 83 mg/dL (ref 0–149)

## 2014-10-26 ENCOUNTER — Ambulatory Visit: Payer: Medicare Other | Admitting: Nurse Practitioner

## 2014-10-31 ENCOUNTER — Other Ambulatory Visit: Payer: Self-pay | Admitting: *Deleted

## 2014-10-31 DIAGNOSIS — E785 Hyperlipidemia, unspecified: Secondary | ICD-10-CM

## 2014-10-31 DIAGNOSIS — I10 Essential (primary) hypertension: Secondary | ICD-10-CM

## 2014-10-31 MED ORDER — SIMVASTATIN 40 MG PO TABS: 40.0000 mg | ORAL_TABLET | Freq: Every day | ORAL | Status: DC

## 2014-10-31 MED ORDER — HYDROCHLOROTHIAZIDE 25 MG PO TABS: 25.0000 mg | ORAL_TABLET | Freq: Every day | ORAL | Status: DC

## 2014-10-31 MED ORDER — LISINOPRIL 40 MG PO TABS: 40.0000 mg | ORAL_TABLET | Freq: Every day | ORAL | Status: DC

## 2014-11-16 ENCOUNTER — Ambulatory Visit (INDEPENDENT_AMBULATORY_CARE_PROVIDER_SITE_OTHER): Payer: Medicare Other | Admitting: Nurse Practitioner

## 2014-11-16 ENCOUNTER — Encounter: Payer: Self-pay | Admitting: Nurse Practitioner

## 2014-11-16 VITALS — BP 136/82 | HR 74 | Temp 97.9°F | Ht 62.0 in | Wt 172.0 lb

## 2014-11-16 DIAGNOSIS — E785 Hyperlipidemia, unspecified: Secondary | ICD-10-CM | POA: Diagnosis not present

## 2014-11-16 DIAGNOSIS — Z23 Encounter for immunization: Secondary | ICD-10-CM

## 2014-11-16 DIAGNOSIS — I10 Essential (primary) hypertension: Secondary | ICD-10-CM

## 2014-11-16 MED ORDER — HYDROCHLOROTHIAZIDE 25 MG PO TABS: 25.0000 mg | ORAL_TABLET | Freq: Every day | ORAL | Status: DC

## 2014-11-16 MED ORDER — LISINOPRIL 40 MG PO TABS: 40.0000 mg | ORAL_TABLET | Freq: Every day | ORAL | Status: DC

## 2014-11-16 MED ORDER — METOPROLOL TARTRATE 100 MG PO TABS: 100.0000 mg | ORAL_TABLET | Freq: Two times a day (BID) | ORAL | Status: DC

## 2014-11-16 MED ORDER — SIMVASTATIN 40 MG PO TABS: 40.0000 mg | ORAL_TABLET | Freq: Every day | ORAL | Status: DC

## 2014-11-16 NOTE — Patient Instructions (Signed)
Bone Health Our bones do many things. They provide structure, protect organs, anchor muscles, and store calcium. Adequate calcium in your diet and weight-bearing physical activity help build strong bones, improve bone amounts, and may reduce the risk of weakening of bones (osteoporosis) later in life. PEAK BONE MASS By age 68, the average woman has acquired most of her skeletal bone mass. A large decline occurs in older adults which increases the risk of osteoporosis. In women this occurs around the time of menopause. It is important for young girls to reach their peak bone mass in order to maintain bone health throughout life. A person with high bone mass as a young adult will be more likely to have a higher bone mass later in life. Not enough calcium consumption and physical activity early on could result in a failure to achieve optimum bone mass in adulthood. OSTEOPOROSIS Osteoporosis is a disease of the bones. It is defined as low bone mass with deterioration of bone structure. Osteoporosis leads to an increase risk of fractures with falls. These fractures commonly happen in the wrist, hip, and spine. While men and women of all ages and background can develop osteoporosis, some of the risk factors for osteoporosis are:  Female.  White.  Postmenopausal.  Older adults.  Small in body size.  Eating a diet low in calcium.  Physically inactive.  Smoking.  Use of some medications.  Family history. CALCIUM Calcium is a mineral needed by the body for healthy bones, teeth, and proper function of the heart, muscles, and nerves. The body cannot produce calcium so it must be absorbed through food. Good sources of calcium include:  Dairy products (low fat or nonfat milk, cheese, and yogurt).  Dark green leafy vegetables (bok choy and broccoli).  Calcium fortified foods (orange juice, cereal, bread, soy beverages, and tofu products).  Nuts (almonds). Recommended amounts of calcium vary  for individuals. RECOMMENDED CALCIUM INTAKES Age and Amount in mg per day  Children 1 to 3 years / 700 mg  Children 4 to 8 years / 1,000 mg  Children 9 to 13 years / 1,300 mg  Teens 14 to 18 years / 1,300 mg  Adults 19 to 50 years / 1,000 mg  Adult women 51 to 70 years / 1,200 mg  Adults 71 years and older / 1,200 mg  Pregnant and breastfeeding teens / 1,300 mg  Pregnant and breastfeeding adults / 1,000 mg Vitamin D also plays an important role in healthy bone development. Vitamin D helps in the absorption of calcium. WEIGHT-BEARING PHYSICAL ACTIVITY Regular physical activity has many positive health benefits. Benefits include strong bones. Weight-bearing physical activity early in life is important in reaching peak bone mass. Weight-bearing physical activities cause muscles and bones to work against gravity. Some examples of weight bearing physical activities include:  Walking, jogging, or running.  Field Hockey.  Jumping rope.  Dancing.  Soccer.  Tennis or Racquetball.  Stair climbing.  Basketball.  Hiking.  Weight lifting.  Aerobic fitness classes. Including weight-bearing physical activity into an exercise plan is a great way to keep bones healthy. Adults: Engage in at least 30 minutes of moderate physical activity on most, preferably all, days of the week. Children: Engage in at least 60 minutes of moderate physical activity on most, preferably all, days of the week. FOR MORE INFORMATION [place] Department [employer] Agriculture, Center for Nutrition Policy and Promotion: www.cnpp.usda.gov National Osteoporosis Foundation: www.nof.org Document Released: 10/18/2003 Document Revised: 11/22/2012 Document Reviewed: 01/17/2009 Exit[employer] Patient Information   2015 [employer]. This information is not intended to replace advice given to you by your health care provider. Make sure you discuss any questions you have with your health care provider.  

## 2014-11-16 NOTE — Addendum Note (Signed)
Addended by: Cleda DaubUCKER, Terald Jump G on: 11/16/2014 02:19 PM   Modules accepted: Orders

## 2014-11-16 NOTE — Progress Notes (Signed)
  Subjective:    Patient ID: Deanna Becker, female    DOB: 1945-10-28, 69 y.o.   MRN: [id number]  Patient here today for follow up of chronic medical problems.     Hypertension This is a chronic problem. The current episode started more than 1 year ago. The problem is unchanged. The problem is uncontrolled. Pertinent negatives include no chest pain, headaches, palpitations or shortness of breath. Risk factors for coronary artery disease include dyslipidemia. Past treatments include ACE inhibitors, diuretics and beta blockers. The current treatment provides mild improvement.  Hyperlipidemia This is a chronic problem. The current episode started more than 1 year ago. The problem is uncontrolled. Recent lipid tests were reviewed and are normal. Pertinent negatives include no chest pain or shortness of breath. Current antihyperlipidemic treatment includes statins. The current treatment provides mild improvement of lipids. Compliance problems include adherence to diet and adherence to exercise.  Risk factors for coronary artery disease include dyslipidemia and hypertension.      Review of Systems  Constitutional: Negative for fever, chills and appetite change.  Respiratory: Negative for cough and shortness of breath.   Cardiovascular: Negative for chest pain and palpitations.  Gastrointestinal: Negative for diarrhea and constipation.  Neurological: Negative for headaches.  All other systems reviewed and are negative.      Objective:   Physical Exam  Constitutional: She is oriented to person, place, and time. She appears well-developed and well-nourished.  HENT:  Nose: Nose normal.  Mouth/Throat: Oropharynx is clear and moist.  Eyes: EOM are normal.  Neck: Trachea normal, normal range of motion and full passive range of motion without pain. Neck supple. No JVD present. Carotid bruit is not present. No thyromegaly present.  Cardiovascular: Normal rate, regular rhythm, normal heart sounds  and intact distal pulses.  Exam reveals no gallop and no friction rub.   No murmur heard. Pulmonary/Chest: Effort normal and breath sounds normal.  Abdominal: Soft. Bowel sounds are normal. She exhibits no distension and no mass. There is no tenderness.  Musculoskeletal: Normal range of motion.  Lymphadenopathy:    She has no cervical adenopathy.  Neurological: She is alert and oriented to person, place, and time. She has normal reflexes.  Skin: Skin is warm and dry.  Psychiatric: She has a normal mood and affect. Her behavior is normal. Judgment and thought content normal.     BP 136/82 mmHg  Pulse 74  Temp(Src) 97.9 F (36.6 C) (Oral)  Ht $R'5\' 2"'PC$  (1.575 m)  Wt 172 lb (78.019 kg)  BMI 31.45 kg/m2       Assessment & Plan:    1. Essential hypertension Do not add salt to diet - hydrochlorothiazide (HYDRODIURIL) 25 MG tablet; Take 1 tablet (25 mg total) by mouth daily.  Dispense: 90 tablet; Refill: 1 - lisinopril (PRINIVIL,ZESTRIL) 40 MG tablet; Take 1 tablet (40 mg total) by mouth daily.  Dispense: 90 tablet; Refill: 1 - metoprolol (LOPRESSOR) 100 MG tablet; Take 1 tablet (100 mg total) by mouth 2 (two) times daily.  Dispense: 180 tablet; Refill: 1 - CMP14+EGFR  2. Hyperlipidemia with target LDL less than 100 Low fat diet - simvastatin (ZOCOR) 40 MG tablet; Take 1 tablet (40 mg total) by mouth at bedtime.  Dispense: 90 tablet; Refill: 1 - NMR, lipoprofile   Refuses mammo, colonoscopy and dexa Labs pending Health maintenance reviewed Diet and exercise encouraged Continue all meds Follow up  In 3 months   [place], FNP

## 2014-11-17 LAB — CMP14+EGFR
ALK PHOS: 70 IU/L (ref 39–117)
ALT: 28 IU/L (ref 0–32)
AST: 24 IU/L (ref 0–40)
Albumin/Globulin Ratio: 1.6 (ref 1.1–2.5)
Albumin: 4.7 g/dL (ref 3.6–4.8)
BILIRUBIN TOTAL: 1.6 mg/dL — AB (ref 0.0–1.2)
BUN / CREAT RATIO: 21 (ref 11–26)
BUN: 16 mg/dL (ref 8–27)
CO2: 24 mmol/L (ref 18–29)
CREATININE: 0.76 mg/dL (ref 0.57–1.00)
Calcium: 9.7 mg/dL (ref 8.7–10.3)
Chloride: 99 mmol/L (ref 97–108)
GFR, EST AFRICAN AMERICAN: 94 mL/min/{1.73_m2} (ref >59)
GFR, EST NON AFRICAN AMERICAN: 81 mL/min/{1.73_m2} (ref >59)
GLOBULIN, TOTAL: 2.9 g/dL (ref 1.5–4.5)
GLUCOSE: 107 mg/dL — AB (ref 65–99)
Potassium: 4.3 mmol/L (ref 3.5–5.2)
SODIUM: 140 mmol/L (ref 134–144)
Total Protein: 7.6 g/dL (ref 6.0–8.5)

## 2014-11-17 LAB — NMR, LIPOPROFILE
CHOLESTEROL: 173 mg/dL (ref 100–199)
HDL Cholesterol by NMR: 79 mg/dL (ref >39)
HDL PARTICLE NUMBER: 45.2 umol/L (ref >=30.5)
LDL Particle Number: 924 nmol/L (ref <1000)
LDL SIZE: 20.6 nm (ref >20.5)
LDL-C: 69 mg/dL (ref 0–99)
LP-IR SCORE: 34 (ref <=45)
SMALL LDL PARTICLE NUMBER: 341 nmol/L (ref <=527)
TRIGLYCERIDES BY NMR: 123 mg/dL (ref 0–149)

## 2014-12-13 ENCOUNTER — Other Ambulatory Visit: Payer: Medicare Other

## 2014-12-13 DIAGNOSIS — Z1212 Encounter for screening for malignant neoplasm of rectum: Secondary | ICD-10-CM

## 2014-12-15 LAB — FECAL OCCULT BLOOD, IMMUNOCHEMICAL: FECAL OCCULT BLD: NEGATIVE

## 2015-01-31 IMAGING — US US RENAL PORT
1 series · 14 of 25 positions shown · non-contrast
Comparison: None.

***ADDENDUM*** CREATED: 11/20/2012 [DATE]

Dictation error in body of original report.
Right kidney is normal in size, measuring 13.1 cm (not 30.1 cm).
Remainder of report, including the impression, is unchanged.
***END ADDENDUM*** SIGNED BY: Fumfu Jaiani, M.D.
CLINICAL DATA: Acute renal failure
RENAL/URINARY TRACT ULTRASOUND COMPLETE

[Series 1: us renal port · 0.32mm/px · 14 of 34 slices shown]
[im 1/34]
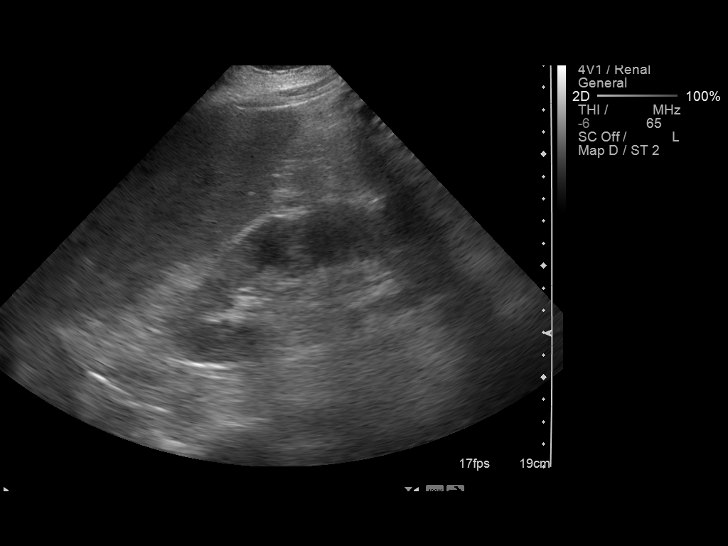
[im 3/34]
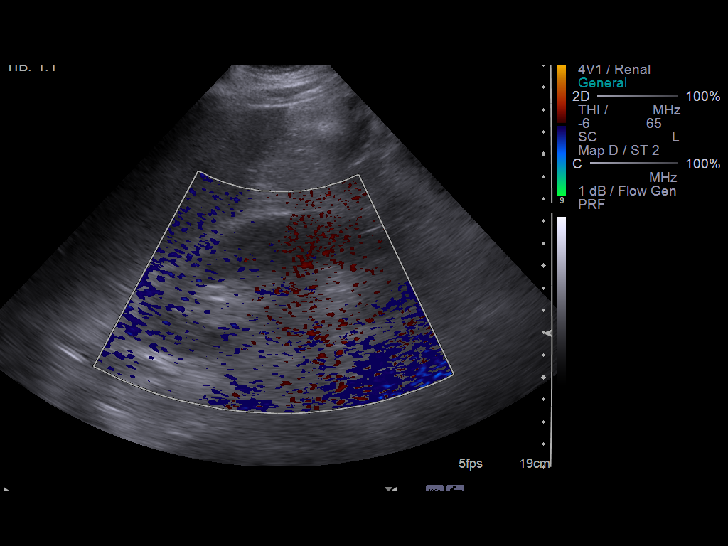
[im 6/34]
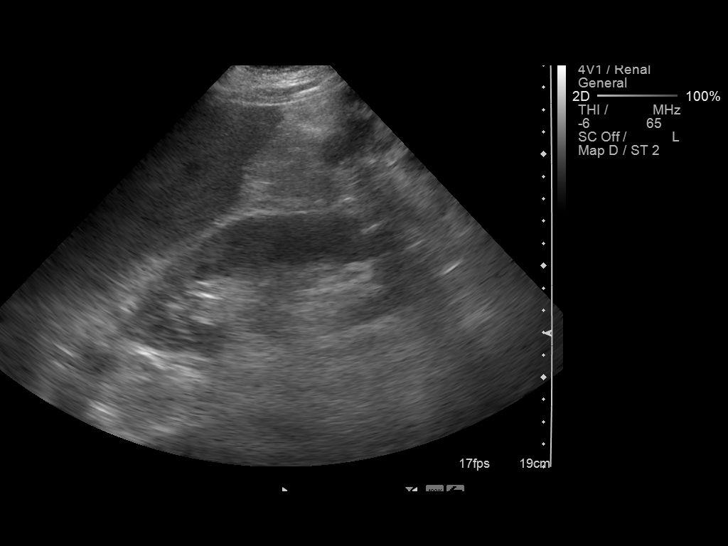
[im 9/34]
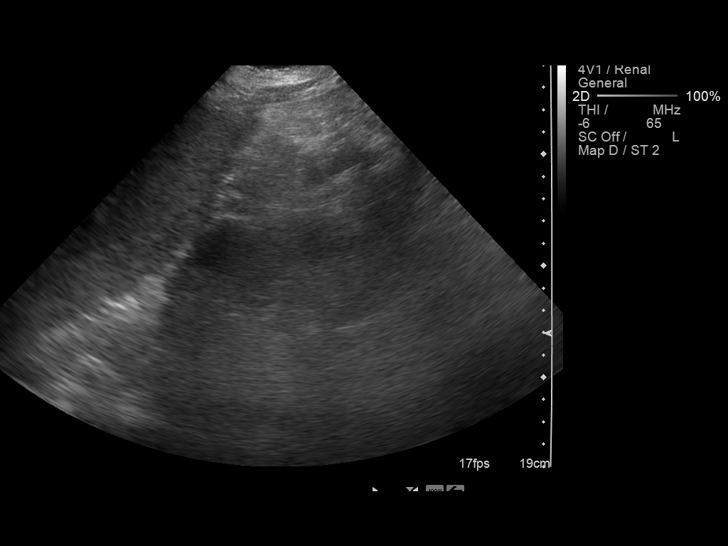
[im 12/34]
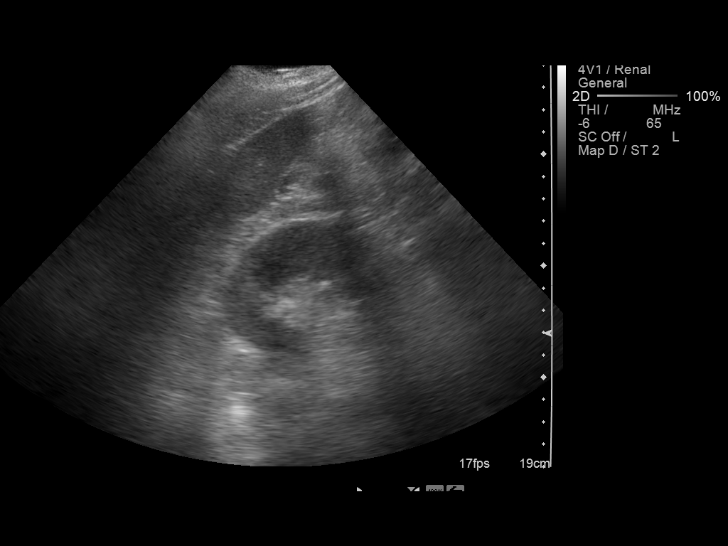
[im 13/34]
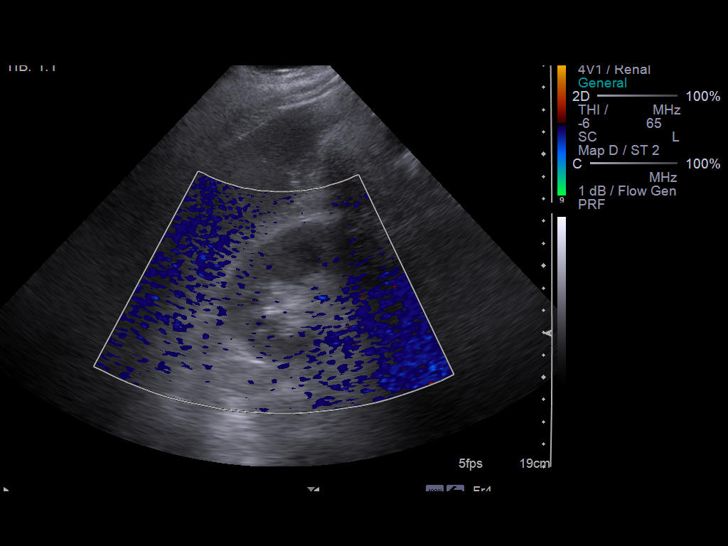
[im 16/34]
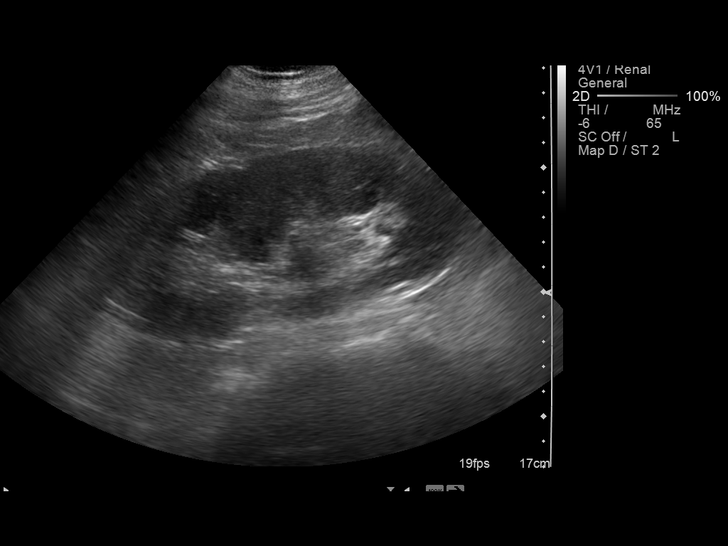
[im 18/34]
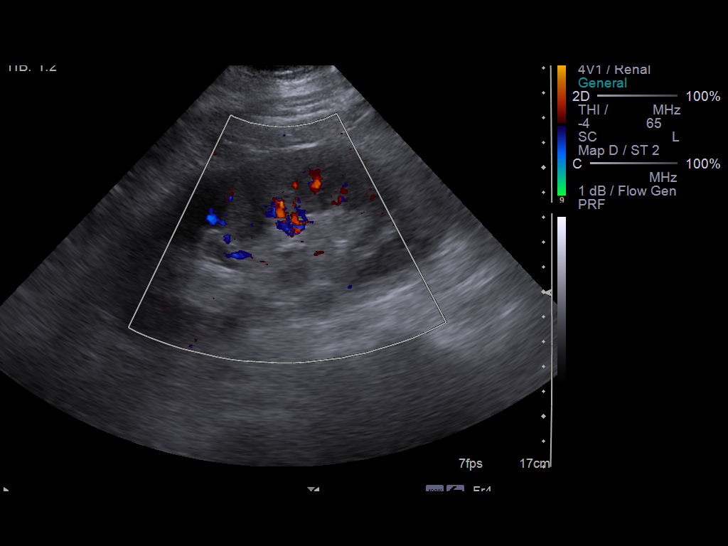
[im 21/34]
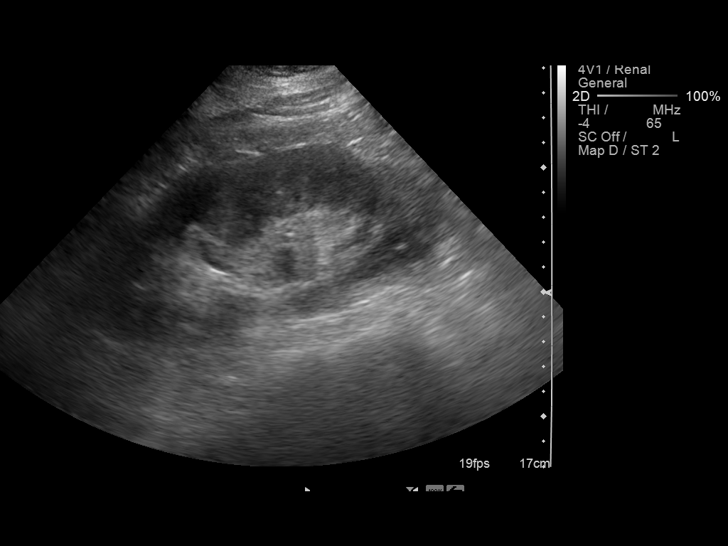
[im 23/34]
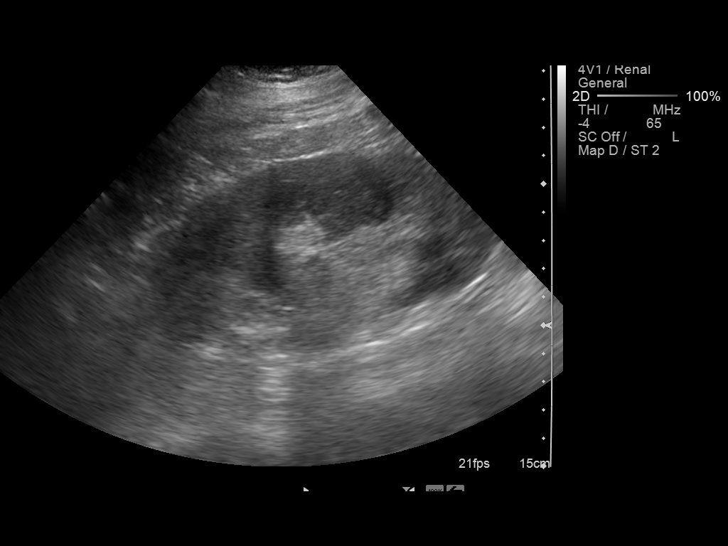
[im 25/34]
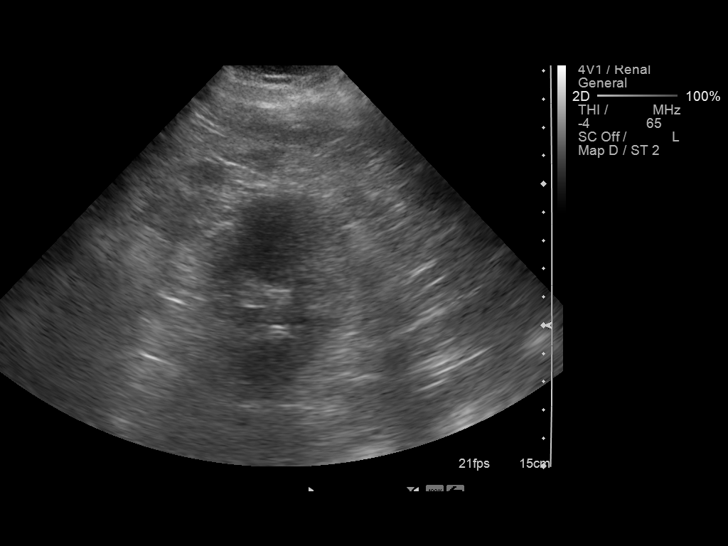
[im 28/34]
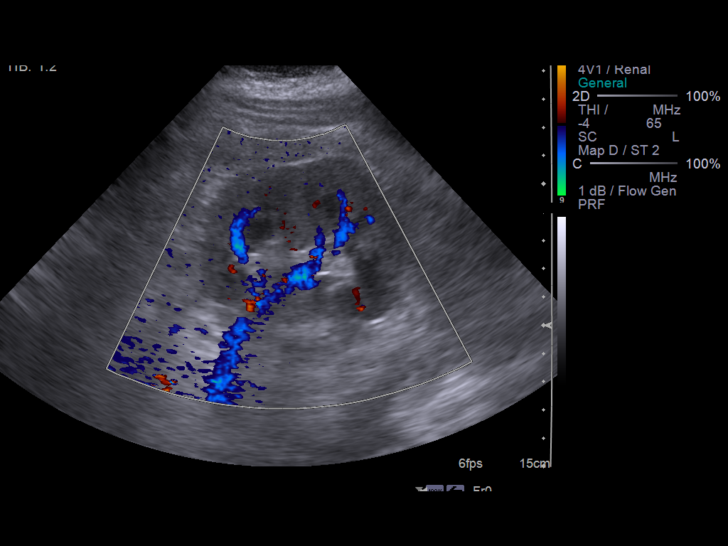
[im 31/34]
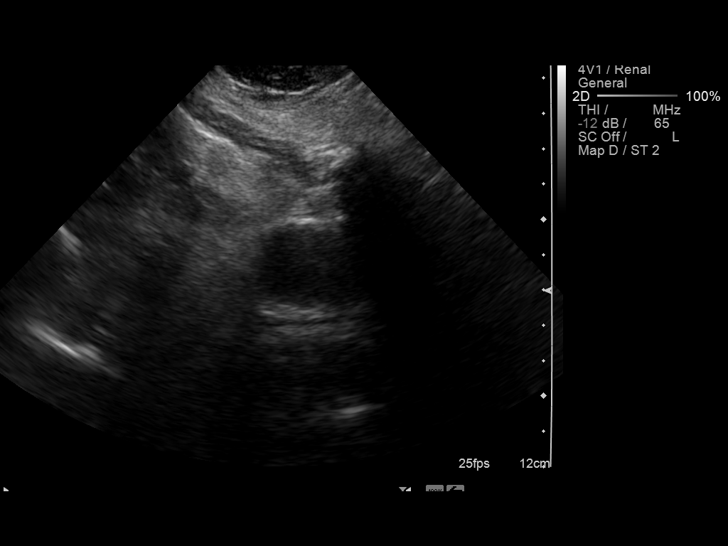
[im 34/34]
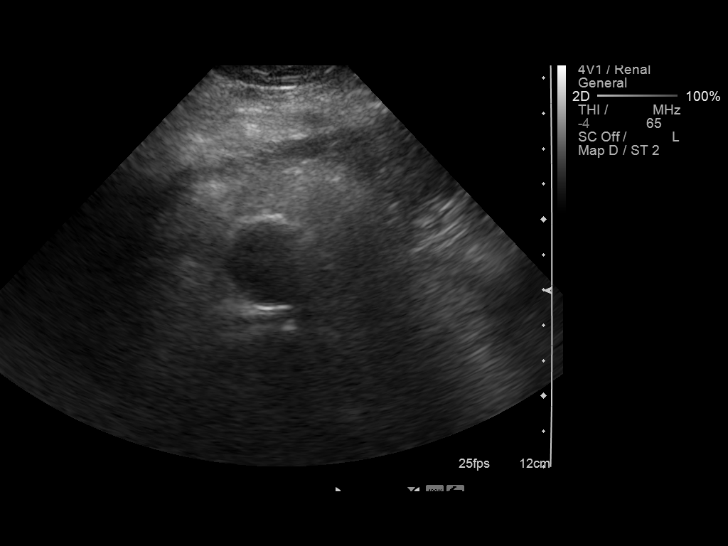

[14 of 25 positions shown; findings below may reference images not displayed]

FINDINGS: Right Kidney:  Measures 30.1 cm.  No mass or hydronephrosis.

Left Kidney:  Measures 13.9 cm.  No mass or hydronephrosis.

Bladder:  Decompressed by indwelling Foley catheter.
IMPRESSION: No hydronephrosis.

Bladder is decompressed by indwelling Foley catheter.

## 2015-02-05 IMAGING — CR DG CHEST 1V PORT
1 series · 1 of 1 positions shown · non-contrast
Comparison: Portable exam 5522 hours compared to 11/21/2012

CLINICAL DATA: Acute dyspnea, felt like airway was blocked,
improved symptoms

PORTABLE CHEST - 1 VIEW

[AP]
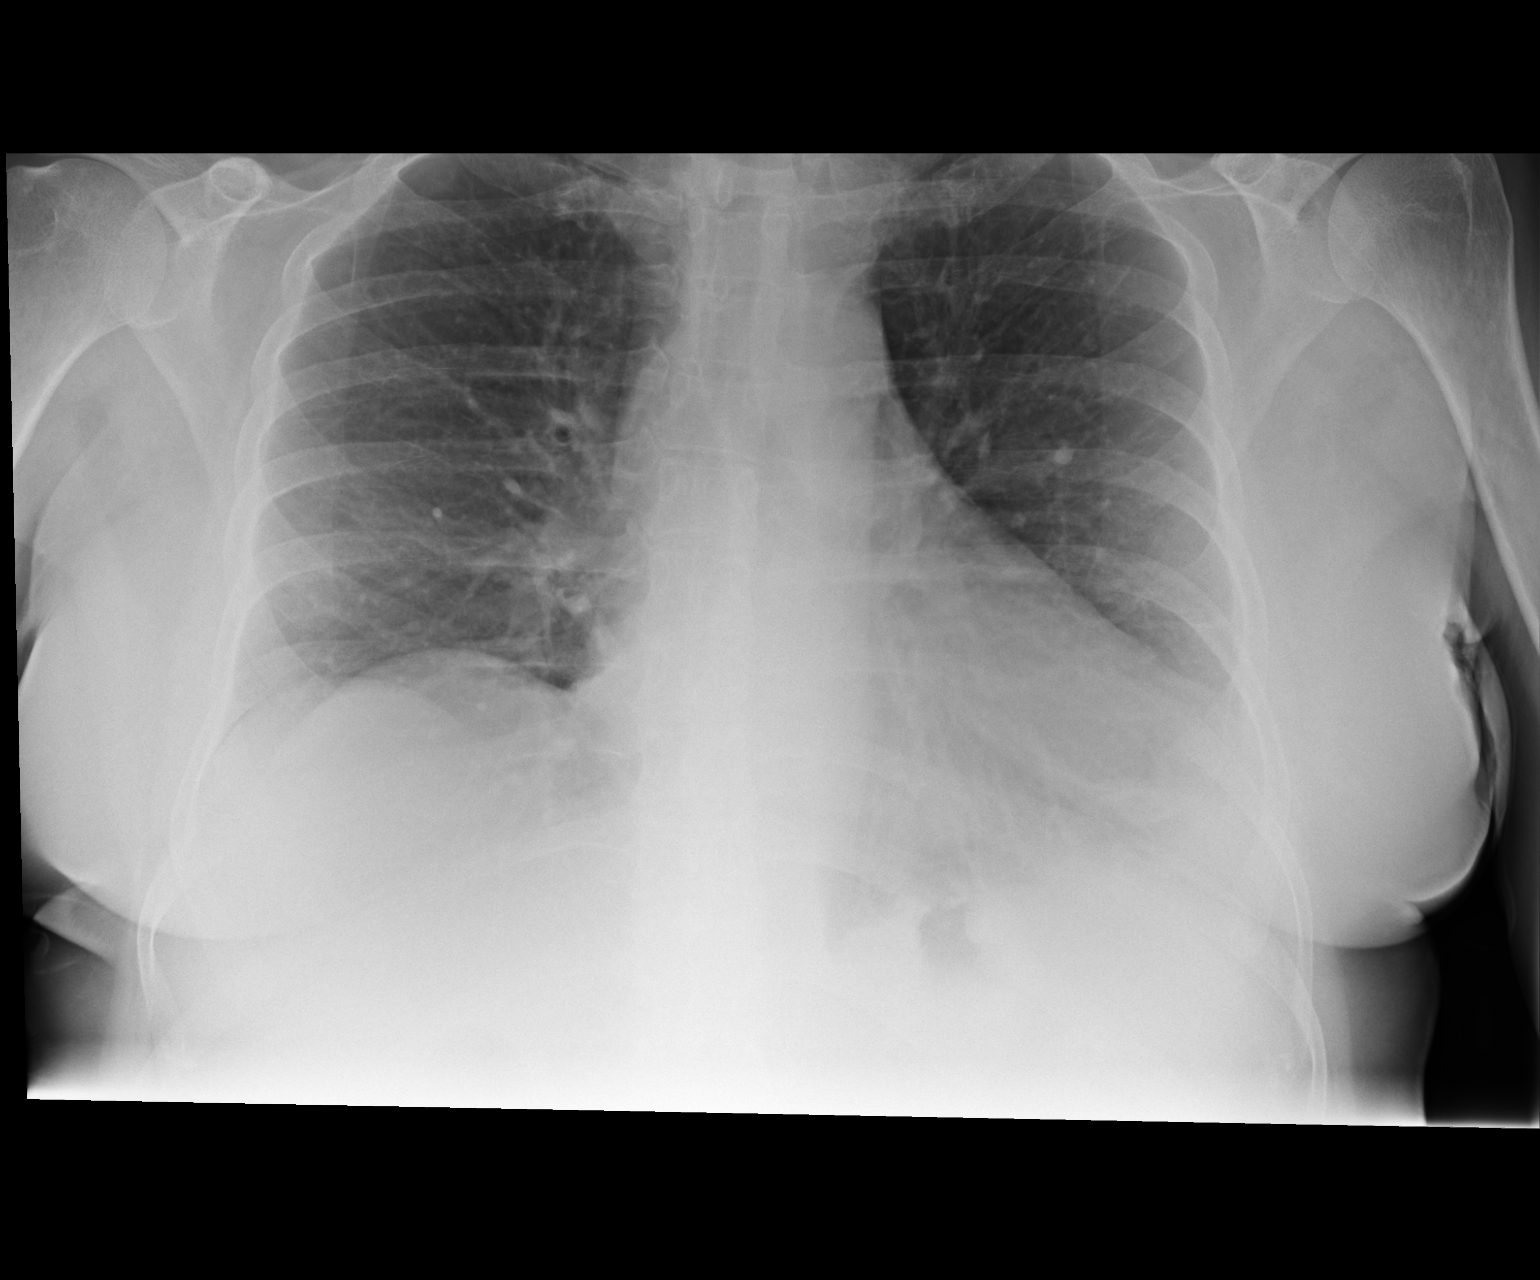

[1 of 1 positions shown; findings below may reference images not displayed]

FINDINGS: Borderline enlargement of cardiac silhouette.
Mediastinal contours and pulmonary vascularity normal.
Improved aeration at left lung base with mild residual atelectasis
and effusion.
Remaining lungs clear.
No pleural effusion or pneumothorax.
Minimal central peribronchial thickening.
Interval removal of right jugular central venous catheter.
IMPRESSION: Borderline enlargement of cardiac silhouette.
Bronchitic changes with decreased atelectasis versus infiltrate
left base.
Minimal residual left pleural effusion.

## 2015-02-22 ENCOUNTER — Ambulatory Visit: Payer: Medicare Other | Admitting: Nurse Practitioner

## 2015-03-27 ENCOUNTER — Other Ambulatory Visit: Payer: Self-pay | Admitting: Nurse Practitioner

## 2015-03-27 ENCOUNTER — Encounter: Payer: Self-pay | Admitting: Nurse Practitioner

## 2015-03-27 ENCOUNTER — Ambulatory Visit (INDEPENDENT_AMBULATORY_CARE_PROVIDER_SITE_OTHER): Payer: Medicare Other | Admitting: Nurse Practitioner

## 2015-03-27 VITALS — BP 142/88 | HR 86 | Temp 97.0°F | Ht 62.0 in | Wt 176.0 lb

## 2015-03-27 DIAGNOSIS — E785 Hyperlipidemia, unspecified: Secondary | ICD-10-CM

## 2015-03-27 DIAGNOSIS — Z780 Asymptomatic menopausal state: Secondary | ICD-10-CM

## 2015-03-27 DIAGNOSIS — I10 Essential (primary) hypertension: Secondary | ICD-10-CM

## 2015-03-27 MED ORDER — METOPROLOL TARTRATE 100 MG PO TABS
100.0000 mg | ORAL_TABLET | Freq: Two times a day (BID) | ORAL | Status: DC
Start: 2015-03-27 — End: 2015-09-27

## 2015-03-27 MED ORDER — SIMVASTATIN 40 MG PO TABS: 40.0000 mg | ORAL_TABLET | Freq: Every day | ORAL | Status: DC

## 2015-03-27 MED ORDER — HYDROCHLOROTHIAZIDE 25 MG PO TABS: 25.0000 mg | ORAL_TABLET | Freq: Every day | ORAL | Status: DC

## 2015-03-27 MED ORDER — LISINOPRIL 40 MG PO TABS: 40.0000 mg | ORAL_TABLET | Freq: Every day | ORAL | Status: DC

## 2015-03-27 NOTE — Patient Instructions (Signed)

## 2015-03-27 NOTE — Progress Notes (Addendum)
  Subjective:    Patient ID: Deanna Becker, female    DOB: 1945-11-02, 69 y.o.   MRN: [id number]  Patient here today for follow up of chronic medical problems.     Hypertension This is a chronic problem. The current episode started more than 1 year ago. The problem is unchanged. The problem is uncontrolled. Pertinent negatives include no chest pain, headaches, palpitations or shortness of breath. Risk factors for coronary artery disease include dyslipidemia. Past treatments include ACE inhibitors, diuretics and beta blockers. The current treatment provides mild improvement.  Hyperlipidemia This is a chronic problem. The current episode started more than 1 year ago. The problem is uncontrolled. Recent lipid tests were reviewed and are normal. Pertinent negatives include no chest pain or shortness of breath. Current antihyperlipidemic treatment includes statins. The current treatment provides mild improvement of lipids. Compliance problems include adherence to diet and adherence to exercise.  Risk factors for coronary artery disease include dyslipidemia and hypertension.      Review of Systems  Constitutional: Negative for fever, chills and appetite change.  Respiratory: Negative for cough and shortness of breath.   Cardiovascular: Negative for chest pain and palpitations.  Gastrointestinal: Negative for diarrhea and constipation.  Neurological: Negative for headaches.  All other systems reviewed and are negative.      Objective:   Physical Exam  Constitutional: She is oriented to person, place, and time. She appears well-developed and well-nourished.  HENT:  Nose: Nose normal.  Mouth/Throat: Oropharynx is clear and moist.  Eyes: EOM are normal.  Neck: Trachea normal, normal range of motion and full passive range of motion without pain. Neck supple. No JVD present. Carotid bruit is not present. No thyromegaly present.  Cardiovascular: Normal rate, regular rhythm, normal heart sounds  and intact distal pulses.  Exam reveals no gallop and no friction rub.   No murmur heard. Pulmonary/Chest: Effort normal and breath sounds normal.  Abdominal: Soft. Bowel sounds are normal. She exhibits no distension and no mass. There is no tenderness.  Musculoskeletal: Normal range of motion.  Lymphadenopathy:    She has no cervical adenopathy.  Neurological: She is alert and oriented to person, place, and time. She has normal reflexes.  Skin: Skin is warm and dry.  Psychiatric: She has a normal mood and affect. Her behavior is normal. Judgment and thought content normal.   BP 142/88 mmHg  Pulse 86  Temp(Src) 97 F (36.1 C) (Oral)  Ht $R'5\' 2"'uF$  (1.575 m)  Wt 176 lb (79.833 kg)  BMI 32.18 kg/m2      Assessment & Plan:   1. Essential hypertension Do not add salt to diet - lisinopril (PRINIVIL,ZESTRIL) 40 MG tablet; Take 1 tablet (40 mg total) by mouth daily.  Dispense: 90 tablet; Refill: 1 - metoprolol (LOPRESSOR) 100 MG tablet; Take 1 tablet (100 mg total) by mouth 2 (two) times daily.  Dispense: 180 tablet; Refill: 1 - hydrochlorothiazide (HYDRODIURIL) 25 MG tablet; Take 1 tablet (25 mg total) by mouth daily.  Dispense: 90 tablet; Refill: 1  2. Hyperlipidemia with target LDL less than 100 Low fat diet - simvastatin (ZOCOR) 40 MG tablet; Take 1 tablet (40 mg total) by mouth at bedtime.  Dispense: 90 tablet; Refill: 1  Orders Placed This Encounter  Procedures  . CMP14+EGFR  . Lipid panel     Labs pending Health maintenance reviewed Diet and exercise encouraged Continue all meds Follow up  In 6 month   [place], FNP

## 2015-03-27 NOTE — Addendum Note (Signed)
Addended by: Bennie Pierini on: 03/27/2015 02:22 PM   Modules accepted: Orders

## 2015-03-28 LAB — CMP14+EGFR
ALBUMIN: 4.5 g/dL (ref 3.6–4.8)
ALT: 22 IU/L (ref 0–32)
AST: 20 IU/L (ref 0–40)
Albumin/Globulin Ratio: 1.7 (ref 1.1–2.5)
Alkaline Phosphatase: 72 IU/L (ref 39–117)
BILIRUBIN TOTAL: 1.2 mg/dL (ref 0.0–1.2)
BUN / CREAT RATIO: 29 — AB (ref 11–26)
BUN: 18 mg/dL (ref 8–27)
CHLORIDE: 97 mmol/L (ref 97–108)
CO2: 24 mmol/L (ref 18–29)
CREATININE: 0.63 mg/dL (ref 0.57–1.00)
Calcium: 9.5 mg/dL (ref 8.7–10.3)
GFR calc non Af Amer: 93 mL/min/{1.73_m2} (ref >59)
GFR, EST AFRICAN AMERICAN: 107 mL/min/{1.73_m2} (ref >59)
GLOBULIN, TOTAL: 2.7 g/dL (ref 1.5–4.5)
Glucose: 94 mg/dL (ref 65–99)
Potassium: 4.4 mmol/L (ref 3.5–5.2)
Sodium: 139 mmol/L (ref 134–144)
TOTAL PROTEIN: 7.2 g/dL (ref 6.0–8.5)

## 2015-03-28 LAB — LIPID PANEL
Chol/HDL Ratio: 2.6 ratio units (ref 0.0–4.4)
Cholesterol, Total: 170 mg/dL (ref 100–199)
HDL: 65 mg/dL (ref >39)
LDL CALC: 73 mg/dL (ref 0–99)
Triglycerides: 159 mg/dL — ABNORMAL HIGH (ref 0–149)
VLDL CHOLESTEROL CAL: 32 mg/dL (ref 5–40)

## 2015-06-08 ENCOUNTER — Ambulatory Visit (INDEPENDENT_AMBULATORY_CARE_PROVIDER_SITE_OTHER): Payer: Medicare Other | Admitting: *Deleted

## 2015-06-08 VITALS — BP 171/83

## 2015-06-08 DIAGNOSIS — I10 Essential (primary) hypertension: Secondary | ICD-10-CM

## 2015-09-27 ENCOUNTER — Ambulatory Visit: Payer: Medicare Other

## 2015-09-27 ENCOUNTER — Encounter: Payer: Self-pay | Admitting: Nurse Practitioner

## 2015-09-27 ENCOUNTER — Ambulatory Visit (INDEPENDENT_AMBULATORY_CARE_PROVIDER_SITE_OTHER): Payer: Medicare Other | Admitting: Nurse Practitioner

## 2015-09-27 VITALS — BP 179/94 | HR 80 | Temp 97.5°F | Ht 62.0 in | Wt 176.0 lb

## 2015-09-27 DIAGNOSIS — E785 Hyperlipidemia, unspecified: Secondary | ICD-10-CM

## 2015-09-27 DIAGNOSIS — Z6832 Body mass index (BMI) 32.0-32.9, adult: Secondary | ICD-10-CM

## 2015-09-27 DIAGNOSIS — Z1212 Encounter for screening for malignant neoplasm of rectum: Secondary | ICD-10-CM | POA: Diagnosis not present

## 2015-09-27 DIAGNOSIS — I10 Essential (primary) hypertension: Secondary | ICD-10-CM | POA: Diagnosis not present

## 2015-09-27 MED ORDER — CLONIDINE HCL 0.1 MG PO TABS: 0.1000 mg | ORAL_TABLET | Freq: Two times a day (BID) | ORAL | Status: DC

## 2015-09-27 MED ORDER — METOPROLOL TARTRATE 100 MG PO TABS: 100.0000 mg | ORAL_TABLET | Freq: Two times a day (BID) | ORAL | Status: DC

## 2015-09-27 MED ORDER — SIMVASTATIN 40 MG PO TABS: 40.0000 mg | ORAL_TABLET | Freq: Every day | ORAL | Status: DC

## 2015-09-27 MED ORDER — LISINOPRIL 40 MG PO TABS: 40.0000 mg | ORAL_TABLET | Freq: Every day | ORAL | Status: DC

## 2015-09-27 MED ORDER — AMLODIPINE BESYLATE 10 MG PO TABS: 10.0000 mg | ORAL_TABLET | Freq: Every day | ORAL | Status: DC

## 2015-09-27 MED ORDER — HYDROCHLOROTHIAZIDE 25 MG PO TABS: 25.0000 mg | ORAL_TABLET | Freq: Every day | ORAL | Status: DC

## 2015-09-27 NOTE — Patient Instructions (Signed)
Hypertension Hypertension, commonly called high blood pressure, is when the force of blood pumping through your arteries is too strong. Your arteries are the blood vessels that carry blood from your heart throughout your body. A blood pressure reading consists of a higher number over a lower number, such as 110/72. The higher number (systolic) is the pressure inside your arteries when your heart pumps. The lower number (diastolic) is the pressure inside your arteries when your heart relaxes. Ideally you want your blood pressure below 120/80. Hypertension forces your heart to work harder to pump blood. Your arteries may become narrow or stiff. Having untreated or uncontrolled hypertension can cause heart attack, stroke, kidney disease, and other problems. RISK FACTORS Some risk factors for high blood pressure are controllable. Others are not.  Risk factors you cannot control include:   Race. You may be at higher risk if you are African American.  Age. Risk increases with age.  Gender. Men are at higher risk than women before age 45 years. After age 65, women are at higher risk than men. Risk factors you can control include:  Not getting enough exercise or physical activity.  Being overweight.  Getting too much fat, sugar, calories, or salt in your diet.  Drinking too much alcohol. SIGNS AND SYMPTOMS Hypertension does not usually cause signs or symptoms. Extremely high blood pressure (hypertensive crisis) may cause headache, anxiety, shortness of breath, and nosebleed. DIAGNOSIS To check if you have hypertension, your health care provider will measure your blood pressure while you are seated, with your arm held at the level of your heart. It should be measured at least twice using the same arm. Certain conditions can cause a difference in blood pressure between your right and left arms. A blood pressure reading that is higher than normal on one occasion does not mean that you need treatment. If  it is not clear whether you have high blood pressure, you may be asked to return on a different day to have your blood pressure checked again. Or, you may be asked to monitor your blood pressure at home for 1 or more weeks. TREATMENT Treating high blood pressure includes making lifestyle changes and possibly taking medicine. Living a healthy lifestyle can help lower high blood pressure. You may need to change some of your habits. Lifestyle changes may include:  Following the DASH diet. This diet is high in fruits, vegetables, and whole grains. It is low in salt, red meat, and added sugars.  Keep your sodium intake below 2,300 mg per day.  Getting at least 30-45 minutes of aerobic exercise at least 4 times per week.  Losing weight if necessary.  Not smoking.  Limiting alcoholic beverages.  Learning ways to reduce stress. Your health care provider may prescribe medicine if lifestyle changes are not enough to get your blood pressure under control, and if one of the following is true:  You are 18-59 years of age and your systolic blood pressure is above 140.  You are 60 years of age or older, and your systolic blood pressure is above 150.  Your diastolic blood pressure is above 90.  You have diabetes, and your systolic blood pressure is over 140 or your diastolic blood pressure is over 90.  You have kidney disease and your blood pressure is above 140/90.  You have heart disease and your blood pressure is above 140/90. Your personal target blood pressure may vary depending on your medical conditions, your age, and other factors. HOME CARE INSTRUCTIONS    Have your blood pressure rechecked as directed by your health care provider.   Take medicines only as directed by your health care provider. Follow the directions carefully. Blood pressure medicines must be taken as prescribed. The medicine does not work as well when you skip doses. Skipping doses also puts you at risk for  problems.  Do not smoke.   Monitor your blood pressure at home as directed by your health care provider. SEEK MEDICAL CARE IF:   You think you are having a reaction to medicines taken.  You have recurrent headaches or feel dizzy.  You have swelling in your ankles.  You have trouble with your vision. SEEK IMMEDIATE MEDICAL CARE IF:  You develop a severe headache or confusion.  You have unusual weakness, numbness, or feel faint.  You have severe chest or abdominal pain.  You vomit repeatedly.  You have trouble breathing. MAKE SURE YOU:   Understand these instructions.  Will watch your condition.  Will get help right away if you are not doing well or get worse.   This information is not intended to replace advice given to you by your health care provider. Make sure you discuss any questions you have with your health care provider.   Document Released: 07/28/2005 Document Revised: 12/12/2014 Document Reviewed: 05/20/2013 Else[employer] Interactive Patient Education 2016 [employer]  

## 2015-09-27 NOTE — Progress Notes (Signed)
Subjective:    Patient ID: Deanna Becker, female    DOB: [birth year], 70 y.o.   MRN: [id number]   Patient here today for follow up of chronic medical problems.  Outpatient Encounter Prescriptions as of 09/27/2015  Medication Sig  . aspirin EC 81 MG tablet Take 1 tablet (81 mg total) by mouth daily.  . hydrochlorothiazide (HYDRODIURIL) 25 MG tablet Take 1 tablet (25 mg total) by mouth daily.  Marland Kitchen lisinopril (PRINIVIL,ZESTRIL) 40 MG tablet Take 1 tablet (40 mg total) by mouth daily.  Marland Kitchen loratadine (CLARITIN) 10 MG tablet Take 10 mg by mouth every other day.  . metoprolol (LOPRESSOR) 100 MG tablet Take 1 tablet (100 mg total) by mouth 2 (two) times daily.  . simvastatin (ZOCOR) 40 MG tablet Take 1 tablet (40 mg total) by mouth at bedtime.   No facility-administered encounter medications on file as of 09/27/2015.    Hypertension This is a chronic problem. The current episode started more than 1 year ago. The problem is unchanged. The problem is uncontrolled. Pertinent negatives include no chest pain, headaches, palpitations or shortness of breath. Risk factors for coronary artery disease include dyslipidemia. Past treatments include ACE inhibitors, diuretics and beta blockers. The current treatment provides mild improvement.  Hyperlipidemia This is a chronic problem. The current episode started more than 1 year ago. The problem is uncontrolled. Recent lipid tests were reviewed and are normal. Pertinent negatives include no chest pain or shortness of breath. Current antihyperlipidemic treatment includes statins. The current treatment provides mild improvement of lipids. Compliance problems include adherence to diet and adherence to exercise.  Risk factors for coronary artery disease include dyslipidemia and hypertension.      Review of Systems  Constitutional: Negative.   HENT: Negative.   Respiratory: Negative for shortness of breath.   Cardiovascular: Negative for chest pain and  palpitations.  Genitourinary: Negative.   Musculoskeletal: Negative.   Neurological: Negative.  Negative for headaches.  Psychiatric/Behavioral: Negative.   All other systems reviewed and are negative.      Objective:   Physical Exam  Constitutional: She is oriented to person, place, and time. She appears well-developed and well-nourished.  HENT:  Nose: Nose normal.  Mouth/Throat: Oropharynx is clear and moist.  Eyes: EOM are normal.  Neck: Trachea normal, normal range of motion and full passive range of motion without pain. Neck supple. No JVD present. Carotid bruit is not present. No thyromegaly present.  Cardiovascular: Normal rate, regular rhythm, normal heart sounds and intact distal pulses.  Exam reveals no gallop and no friction rub.   No murmur heard. Pulmonary/Chest: Effort normal and breath sounds normal.  Abdominal: Soft. Bowel sounds are normal. She exhibits no distension and no mass. There is no tenderness.  Musculoskeletal: Normal range of motion.  Lymphadenopathy:    She has no cervical adenopathy.  Neurological: She is alert and oriented to person, place, and time. She has normal reflexes.  Skin: Skin is warm and dry.  Psychiatric: She has a normal mood and affect. Her behavior is normal. Judgment and thought content normal.    BP 179/94 mmHg  Pulse 80  Temp(Src) 97.5 F (36.4 C) (Oral)  Ht '5\' 2"'  (1.575 m)  Wt 176 lb (79.833 kg)  BMI 32.18 kg/m2         Assessment & Plan:  1. BMI 32.0-32.9,adult Discussed diet and exercise for person with BMI >25 Will recheck weight in 3-6 months   2. Essential hypertension Do not add salt to diet  Keep diary of blood pressure and let me know if staying above 589 systolic Added clonidine to meds - CMP14+EGFR - cloNIDine (CATAPRES) 0.1 MG tablet; Take 1 tablet (0.1 mg total) by mouth 2 (two) times daily.  Dispense: 60 tablet; Refill: 3  3. Hyperlipidemia with target LDL less than 100 Low fat diet - Lipid  panel  4. Screening for malignant neoplasm of the rectum - Fecal occult blood, imunochemical; Future   Will do dexa at next visit Labs pending Health maintenance reviewed Diet and exercise encouraged Continue all meds Follow up  In 3 months   [place], FNP

## 2015-09-28 LAB — CMP14+EGFR
ALBUMIN: 4.5 g/dL (ref 3.6–4.8)
ALT: 23 IU/L (ref 0–32)
AST: 19 IU/L (ref 0–40)
Albumin/Globulin Ratio: 1.7 (ref 1.1–2.5)
Alkaline Phosphatase: 79 IU/L (ref 39–117)
BUN/Creatinine Ratio: 24 (ref 11–26)
BUN: 16 mg/dL (ref 8–27)
Bilirubin Total: 1.7 mg/dL — ABNORMAL HIGH (ref 0.0–1.2)
CO2: 22 mmol/L (ref 18–29)
CREATININE: 0.66 mg/dL (ref 0.57–1.00)
Calcium: 9.6 mg/dL (ref 8.7–10.3)
Chloride: 98 mmol/L (ref 96–106)
GFR, EST AFRICAN AMERICAN: 105 mL/min/{1.73_m2} (ref >59)
GFR, EST NON AFRICAN AMERICAN: 91 mL/min/{1.73_m2} (ref >59)
Globulin, Total: 2.6 g/dL (ref 1.5–4.5)
Glucose: 112 mg/dL — ABNORMAL HIGH (ref 65–99)
Potassium: 4.1 mmol/L (ref 3.5–5.2)
Sodium: 139 mmol/L (ref 134–144)
TOTAL PROTEIN: 7.1 g/dL (ref 6.0–8.5)

## 2015-09-28 LAB — LIPID PANEL
CHOL/HDL RATIO: 2.7 ratio (ref 0.0–4.4)
Cholesterol, Total: 169 mg/dL (ref 100–199)
HDL: 62 mg/dL (ref >39)
LDL Calculated: 80 mg/dL (ref 0–99)
TRIGLYCERIDES: 134 mg/dL (ref 0–149)
VLDL CHOLESTEROL CAL: 27 mg/dL (ref 5–40)

## 2016-01-10 ENCOUNTER — Ambulatory Visit: Payer: Medicare Other | Admitting: Nurse Practitioner

## 2016-01-17 ENCOUNTER — Encounter: Payer: Self-pay | Admitting: Nurse Practitioner

## 2016-01-17 ENCOUNTER — Ambulatory Visit (INDEPENDENT_AMBULATORY_CARE_PROVIDER_SITE_OTHER): Payer: Medicare Other | Admitting: Nurse Practitioner

## 2016-01-17 VITALS — BP 152/88 | HR 71 | Temp 96.6°F | Ht 62.0 in | Wt 173.0 lb

## 2016-01-17 DIAGNOSIS — Z1159 Encounter for screening for other viral diseases: Secondary | ICD-10-CM

## 2016-01-17 DIAGNOSIS — Z6831 Body mass index (BMI) 31.0-31.9, adult: Secondary | ICD-10-CM

## 2016-01-17 DIAGNOSIS — Z1212 Encounter for screening for malignant neoplasm of rectum: Secondary | ICD-10-CM

## 2016-01-17 DIAGNOSIS — Z6832 Body mass index (BMI) 32.0-32.9, adult: Secondary | ICD-10-CM | POA: Insufficient documentation

## 2016-01-17 DIAGNOSIS — J309 Allergic rhinitis, unspecified: Secondary | ICD-10-CM

## 2016-01-17 DIAGNOSIS — I10 Essential (primary) hypertension: Secondary | ICD-10-CM | POA: Diagnosis not present

## 2016-01-17 DIAGNOSIS — E785 Hyperlipidemia, unspecified: Secondary | ICD-10-CM

## 2016-01-17 MED ORDER — CLONIDINE HCL 0.1 MG PO TABS: 0.1000 mg | ORAL_TABLET | Freq: Two times a day (BID) | ORAL | Status: DC

## 2016-01-17 MED ORDER — LISINOPRIL 40 MG PO TABS: 40.0000 mg | ORAL_TABLET | Freq: Every day | ORAL | Status: DC

## 2016-01-17 MED ORDER — SIMVASTATIN 40 MG PO TABS: 40.0000 mg | ORAL_TABLET | Freq: Every day | ORAL | Status: DC

## 2016-01-17 MED ORDER — HYDROCHLOROTHIAZIDE 25 MG PO TABS: 25.0000 mg | ORAL_TABLET | Freq: Every day | ORAL | Status: DC

## 2016-01-17 MED ORDER — METOPROLOL TARTRATE 100 MG PO TABS: 100.0000 mg | ORAL_TABLET | Freq: Two times a day (BID) | ORAL | Status: DC

## 2016-01-17 NOTE — Progress Notes (Signed)
Subjective:    Patient ID: Deanna Becker, female    DOB: [birth year], 70 y.o.   MRN: [id number]   Patient here today for follow up of chronic medical problems.  Outpatient Encounter Prescriptions as of 01/17/2016  Medication Sig  . aspirin EC 81 MG tablet Take 1 tablet (81 mg total) by mouth daily.  . cloNIDine (CATAPRES) 0.1 MG tablet Take 1 tablet (0.1 mg total) by mouth 2 (two) times daily.  . hydrochlorothiazide (HYDRODIURIL) 25 MG tablet Take 1 tablet (25 mg total) by mouth daily.  Marland Kitchen lisinopril (PRINIVIL,ZESTRIL) 40 MG tablet Take 1 tablet (40 mg total) by mouth daily.  Marland Kitchen loratadine (CLARITIN) 10 MG tablet Take 10 mg by mouth every other day.  . metoprolol (LOPRESSOR) 100 MG tablet Take 1 tablet (100 mg total) by mouth 2 (two) times daily.  . simvastatin (ZOCOR) 40 MG tablet Take 1 tablet (40 mg total) by mouth at bedtime.   No facility-administered encounter medications on file as of 01/17/2016.    Hypertension This is a chronic problem. The current episode started more than 1 year ago. The problem is unchanged. The problem is uncontrolled. Pertinent negatives include no chest pain, headaches, palpitations or shortness of breath. Risk factors for coronary artery disease include dyslipidemia. Past treatments include ACE inhibitors, diuretics and beta blockers. The current treatment provides mild improvement.  Hyperlipidemia This is a chronic problem. The current episode started more than 1 year ago. The problem is uncontrolled. Recent lipid tests were reviewed and are normal. Pertinent negatives include no chest pain or shortness of breath. Current antihyperlipidemic treatment includes statins. The current treatment provides mild improvement of lipids. Compliance problems include adherence to diet and adherence to exercise.  Risk factors for coronary artery disease include dyslipidemia and hypertension.  allergic rhinitis Currently on Claritin OTC which seems to be working for  symptoms.   Review of Systems  Constitutional: Negative.   HENT: Negative.   Respiratory: Negative for shortness of breath.   Cardiovascular: Negative for chest pain and palpitations.  Genitourinary: Negative.   Musculoskeletal: Negative.   Neurological: Negative.  Negative for headaches.  Psychiatric/Behavioral: Negative.   All other systems reviewed and are negative.      Objective:   Physical Exam  Constitutional: She is oriented to person, place, and time. She appears well-developed and well-nourished.  HENT:  Nose: Nose normal.  Mouth/Throat: Oropharynx is clear and moist.  Eyes: EOM are normal.  Neck: Trachea normal, normal range of motion and full passive range of motion without pain. Neck supple. No JVD present. Carotid bruit is not present. No thyromegaly present.  Cardiovascular: Normal rate, regular rhythm, normal heart sounds and intact distal pulses.  Exam reveals no gallop and no friction rub.   No murmur heard. Pulmonary/Chest: Effort normal and breath sounds normal.  Abdominal: Soft. Bowel sounds are normal. She exhibits no distension and no mass. There is no tenderness.  Musculoskeletal: Normal range of motion.  Lymphadenopathy:    She has no cervical adenopathy.  Neurological: She is alert and oriented to person, place, and time. She has normal reflexes.  Skin: Skin is warm and dry.  Psychiatric: She has a normal mood and affect. Her behavior is normal. Judgment and thought content normal.    BP 152/88 mmHg  Pulse 71  Temp(Src) 96.6 F (35.9 C) (Oral)  Ht _0  (1.575 m)  Wt 173 lb (78.472 kg)  BMI 31.63 kg/m2      Assessment & Plan:   1.  Essential hypertension Keep diary of blood ressure at home Do not add salt o diet - cloNIDine (CATAPRES) 0.1 MG tablet; Take 1 tablet (0.1 mg total) by mouth 2 (two) times daily.  Dispense: 60 tablet; Refill: 3 - metoprolol (LOPRESSOR) 100 MG tablet; Take 1 tablet (100 mg total) by mouth 2 (two) times daily.   Dispense: 180 tablet; Refill: 1 - hydrochlorothiazide (HYDRODIURIL) 25 MG tablet; Take 1 tablet (25 mg total) by mouth daily.  Dispense: 90 tablet; Refill: 1 - lisinopril (PRINIVIL,ZESTRIL) 40 MG tablet; Take 1 tablet (40 mg total) by mouth daily.  Dispense: 90 tablet; Refill: 1 - CMP14+EGFR  2. Hyperlipidemia with target LDL less than 100 Low fat diet - simvastatin (ZOCOR) 40 MG tablet; Take 1 tablet (40 mg total) by mouth at bedtime.  Dispense: 90 tablet; Refill: 1 - Lipid panel  3. Allergic rhinitis, unspecified allergic rhinitis type Continue claritin as needed  4. BMI 31.0-31.9,adult Discussed diet and exercise for person with BMI >25 Will recheck weight in 3-6 months    hemoccult cards given to patient with directions Labs pending Health maintenance reviewed Diet and exercise encouraged Continue all meds Follow up  In 6 months   [place], FNP

## 2016-01-17 NOTE — Patient Instructions (Signed)
Health Maintenance, Female Adopting a healthy lifestyle and getting preventive care can go a long way to promote health and wellness. Talk with your health care provider about what schedule of regular examinations is right for you. This is a good chance for you to check in with your provider about disease prevention and staying healthy. In between checkups, there are plenty of things you can do on your own. Experts have done a lot of research about which lifestyle changes and preventive measures are most likely to keep you healthy. Ask your health care provider for more information. WEIGHT AND DIET  Eat a healthy diet  Be sure to include plenty of vegetables, fruits, low-fat dairy products, and lean protein.  Do not eat a lot of foods high in solid fats, added sugars, or salt.  Get regular exercise. This is one of the most important things you can do for your health.  Most adults should exercise for at least 150 minutes each week. The exercise should increase your heart rate and make you sweat (moderate-intensity exercise).  Most adults should also do strengthening exercises at least twice a week. This is in addition to the moderate-intensity exercise.  Maintain a healthy weight  Body mass index (BMI) is a measurement that can be used to identify possible weight problems. It estimates body fat based on height and weight. Your health care provider can help determine your BMI and help you achieve or maintain a healthy weight.  For females 62 years of age and older:   A BMI below 18.5 is considered underweight.  A BMI of 18.5 to 24.9 is normal.  A BMI of 25 to 29.9 is considered overweight.  A BMI of 30 and above is considered obese.  Watch levels of cholesterol and blood lipids  You should start having your blood tested for lipids and cholesterol at 69 years of age, then have this test every 5 years.  You may need to have your cholesterol levels checked more often if:  Your lipid  or cholesterol levels are high.  You are older than 69 years of age.  You are at high risk for heart disease.  CANCER SCREENING   Lung Cancer  Lung cancer screening is recommended for adults 41-65 years old who are at high risk for lung cancer because of a history of smoking.  A yearly low-dose CT scan of the lungs is recommended for people who:  Currently smoke.  Have quit within the past 15 years.  Have at least a 30-pack-year history of smoking. A pack year is smoking an average of one pack of cigarettes a day for 1 year.  Yearly screening should continue until it has been 15 years since you quit.  Yearly screening should stop if you develop a health problem that would prevent you from having lung cancer treatment.  Breast Cancer  Practice breast self-awareness. This means understanding how your breasts normally appear and feel.  It also means doing regular breast self-exams. Let your health care provider know about any changes, no matter how small.  If you are in your 20s or 30s, you should have a clinical breast exam (CBE) by a health care provider every 1-3 years as part of a regular health exam.  If you are 7 or older, have a CBE every year. Also consider having a breast X-ray (mammogram) every year.  If you have a family history of breast cancer, talk to your health care provider about genetic screening.  If you  are at high risk for breast cancer, talk to your health care provider about having an MRI and a mammogram every year.  Breast cancer gene (BRCA) assessment is recommended for women who have family members with BRCA-related cancers. BRCA-related cancers include:  Breast.  Ovarian.  Tubal.  Peritoneal cancers.  Results of the assessment will determine the need for genetic counseling and BRCA1 and BRCA2 testing. Cervical Cancer Your health care provider may recommend that you be screened regularly for cancer of the pelvic organs (ovaries, uterus, and  vagina). This screening involves a pelvic examination, including checking for microscopic changes to the surface of your cervix (Pap test). You may be encouraged to have this screening done every 3 years, beginning at age 84.  For women ages 84-65, health care providers may recommend pelvic exams and Pap testing every 3 years, or they may recommend the Pap and pelvic exam, combined with testing for human papilloma virus (HPV), every 5 years. Some types of HPV increase your risk of cervical cancer. Testing for HPV may also be done on women of any age with unclear Pap test results.  Other health care providers may not recommend any screening for nonpregnant women who are considered low risk for pelvic cancer and who do not have symptoms. Ask your health care provider if a screening pelvic exam is right for you.  If you have had past treatment for cervical cancer or a condition that could lead to cancer, you need Pap tests and screening for cancer for at least 20 years after your treatment. If Pap tests have been discontinued, your risk factors (such as having a new sexual partner) need to be reassessed to determine if screening should resume. Some women have medical problems that increase the chance of getting cervical cancer. In these cases, your health care provider may recommend more frequent screening and Pap tests. Colorectal Cancer  This type of cancer can be detected and often prevented.  Routine colorectal cancer screening usually begins at 69 years of age and continues through 69 years of age.  Your health care provider may recommend screening at an earlier age if you have risk factors for colon cancer.  Your health care provider may also recommend using home test kits to check for hidden blood in the stool.  A small camera at the end of a tube can be used to examine your colon directly (sigmoidoscopy or colonoscopy). This is done to check for the earliest forms of colorectal  cancer.  Routine screening usually begins at age 32.  Direct examination of the colon should be repeated every 5-10 years through 69 years of age. However, you may need to be screened more often if early forms of precancerous polyps or small growths are found. Skin Cancer  Check your skin from head to toe regularly.  Tell your health care provider about any new moles or changes in moles, especially if there is a change in a mole's shape or color.  Also tell your health care provider if you have a mole that is larger than the size of a pencil eraser.  Always use sunscreen. Apply sunscreen liberally and repeatedly throughout the day.  Protect yourself by wearing long sleeves, pants, a wide-brimmed hat, and sunglasses whenever you are outside. HEART DISEASE, DIABETES, AND HIGH BLOOD PRESSURE   High blood pressure causes heart disease and increases the risk of stroke. High blood pressure is more likely to develop in:  People who have blood pressure in the high end  of the normal range (130-139/85-89 mm Hg).  People who are overweight or obese.  People who are African American.  If you are 40-18 years of age, have your blood pressure checked every 3-5 years. If you are 17 years of age or older, have your blood pressure checked every year. You should have your blood pressure measured twice--once when you are at a hospital or clinic, and once when you are not at a hospital or clinic. Record the average of the two measurements. To check your blood pressure when you are not at a hospital or clinic, you can use:  An automated blood pressure machine at a pharmacy.  A home blood pressure monitor.  If you are between 96 years and 66 years old, ask your health care provider if you should take aspirin to prevent strokes.  Have regular diabetes screenings. This involves taking a blood sample to check your fasting blood sugar level.  If you are at a normal weight and have a low risk for diabetes,  have this test once every three years after 69 years of age.  If you are overweight and have a high risk for diabetes, consider being tested at a younger age or more often. PREVENTING INFECTION  Hepatitis B  If you have a higher risk for hepatitis B, you should be screened for this virus. You are considered at high risk for hepatitis B if:  You were born in a country where hepatitis B is common. Ask your health care provider which countries are considered high risk.  Your parents were born in a high-risk country, and you have not been immunized against hepatitis B (hepatitis B vaccine).  You have HIV or AIDS.  You use needles to inject street drugs.  You live with someone who has hepatitis B.  You have had sex with someone who has hepatitis B.  You get hemodialysis treatment.  You take certain medicines for conditions, including cancer, organ transplantation, and autoimmune conditions. Hepatitis C  Blood testing is recommended for:  Everyone born from [birth year] through 1964.  Anyone with known risk factors for hepatitis C. Sexually transmitted infections (STIs)  You should be screened for sexually transmitted infections (STIs) including gonorrhea and chlamydia if:  You are sexually active and are younger than 69 years of age.  You are older than 69 years of age and your health care provider tells you that you are at risk for this type of infection.  Your sexual activity has changed since you were last screened and you are at an increased risk for chlamydia or gonorrhea. Ask your health care provider if you are at risk.  If you do not have HIV, but are at risk, it may be recommended that you take a prescription medicine daily to prevent HIV infection. This is called pre-exposure prophylaxis (PrEP). You are considered at risk if:  You are sexually active and do not regularly use condoms or know the HIV status of your partner(s).  You take drugs by injection.  You are sexually  active with a partner who has HIV. Talk with your health care provider about whether you are at high risk of being infected with HIV. If you choose to begin PrEP, you should first be tested for HIV. You should then be tested every 3 months for as long as you are taking PrEP.  PREGNANCY   If you are premenopausal and you may become pregnant, ask your health care provider about preconception counseling.  If you may  become pregnant, take 400 to 800 micrograms (mcg) of folic acid every day.  If you want to prevent pregnancy, talk to your health care provider about birth control (contraception). OSTEOPOROSIS AND MENOPAUSE   Osteoporosis is a disease in which the bones lose minerals and strength with aging. This can result in serious bone fractures. Your risk for osteoporosis can be identified using a bone density scan.  If you are 16 years of age or older, or if you are at risk for osteoporosis and fractures, ask your health care provider if you should be screened.  Ask your health care provider whether you should take a calcium or vitamin D supplement to lower your risk for osteoporosis.  Menopause may have certain physical symptoms and risks.  Hormone replacement therapy may reduce some of these symptoms and risks. Talk to your health care provider about whether hormone replacement therapy is right for you.  HOME CARE INSTRUCTIONS   Schedule regular health, dental, and eye exams.  Stay current with your immunizations.   Do not use any tobacco products including cigarettes, chewing tobacco, or electronic cigarettes.  If you are pregnant, do not drink alcohol.  If you are breastfeeding, limit how much and how often you drink alcohol.  Limit alcohol intake to no more than 1 drink per day for nonpregnant women. One drink equals 12 ounces of beer, 5 ounces of wine, or 1 ounces of hard liquor.  Do not use street drugs.  Do not share needles.  Ask your health care provider for help if  you need support or information about quitting drugs.  Tell your health care provider if you often feel depressed.  Tell your health care provider if you have ever been abused or do not feel safe at home.   This information is not intended to replace advice given to you by your health care provider. Make sure you discuss any questions you have with your health care provider.   Document Released: 02/10/2011 Document Revised: 08/18/2014 Document Reviewed: 06/29/2013 Elsevier Interactive Patient Education [employer].

## 2016-01-18 LAB — CMP14+EGFR
A/G RATIO: 1.8 (ref 1.2–2.2)
ALBUMIN: 4.7 g/dL (ref 3.6–4.8)
ALK PHOS: 77 IU/L (ref 39–117)
ALT: 16 IU/L (ref 0–32)
AST: 18 IU/L (ref 0–40)
BUN / CREAT RATIO: 22 (ref 12–28)
BUN: 16 mg/dL (ref 8–27)
Bilirubin Total: 1.3 mg/dL — ABNORMAL HIGH (ref 0.0–1.2)
CALCIUM: 9.8 mg/dL (ref 8.7–10.3)
CO2: 24 mmol/L (ref 18–29)
CREATININE: 0.74 mg/dL (ref 0.57–1.00)
Chloride: 96 mmol/L (ref 96–106)
GFR calc Af Amer: 96 mL/min/{1.73_m2} (ref >59)
GFR, EST NON AFRICAN AMERICAN: 84 mL/min/{1.73_m2} (ref >59)
GLOBULIN, TOTAL: 2.6 g/dL (ref 1.5–4.5)
Glucose: 108 mg/dL — ABNORMAL HIGH (ref 65–99)
POTASSIUM: 4.3 mmol/L (ref 3.5–5.2)
SODIUM: 137 mmol/L (ref 134–144)
Total Protein: 7.3 g/dL (ref 6.0–8.5)

## 2016-01-18 LAB — LIPID PANEL
CHOL/HDL RATIO: 2.4 ratio (ref 0.0–4.4)
Cholesterol, Total: 158 mg/dL (ref 100–199)
HDL: 65 mg/dL (ref >39)
LDL CALC: 74 mg/dL (ref 0–99)
Triglycerides: 97 mg/dL (ref 0–149)
VLDL Cholesterol Cal: 19 mg/dL (ref 5–40)

## 2016-01-18 LAB — HEPATITIS C ANTIBODY

## 2016-01-31 ENCOUNTER — Other Ambulatory Visit: Payer: Medicare Other

## 2016-01-31 DIAGNOSIS — Z1212 Encounter for screening for malignant neoplasm of rectum: Secondary | ICD-10-CM

## 2016-02-01 LAB — FECAL OCCULT BLOOD, IMMUNOCHEMICAL: FECAL OCCULT BLD: NEGATIVE

## 2016-05-12 ENCOUNTER — Other Ambulatory Visit: Payer: Self-pay | Admitting: *Deleted

## 2016-05-12 DIAGNOSIS — I10 Essential (primary) hypertension: Secondary | ICD-10-CM

## 2016-05-12 MED ORDER — CLONIDINE HCL 0.1 MG PO TABS: 0.1000 mg | ORAL_TABLET | Freq: Two times a day (BID) | ORAL | 2 refills | Status: DC

## 2016-05-12 NOTE — Telephone Encounter (Signed)
Refilled per protocol. Next OV set for December

## 2016-07-21 ENCOUNTER — Encounter: Payer: Self-pay | Admitting: Nurse Practitioner

## 2016-07-21 ENCOUNTER — Ambulatory Visit (INDEPENDENT_AMBULATORY_CARE_PROVIDER_SITE_OTHER): Payer: Medicare Other | Admitting: Nurse Practitioner

## 2016-07-21 VITALS — BP 187/76 | HR 68 | Temp 97.2°F | Ht 62.0 in | Wt 177.0 lb

## 2016-07-21 DIAGNOSIS — Z6832 Body mass index (BMI) 32.0-32.9, adult: Secondary | ICD-10-CM | POA: Diagnosis not present

## 2016-07-21 DIAGNOSIS — E785 Hyperlipidemia, unspecified: Secondary | ICD-10-CM | POA: Diagnosis not present

## 2016-07-21 DIAGNOSIS — I10 Essential (primary) hypertension: Secondary | ICD-10-CM

## 2016-07-21 LAB — CMP14+EGFR
A/G RATIO: 1.8 (ref 1.2–2.2)
ALBUMIN: 4.4 g/dL (ref 3.6–4.8)
ALT: 12 IU/L (ref 0–32)
AST: 15 IU/L (ref 0–40)
Alkaline Phosphatase: 78 IU/L (ref 39–117)
BILIRUBIN TOTAL: 1 mg/dL (ref 0.0–1.2)
BUN / CREAT RATIO: 25 (ref 12–28)
BUN: 17 mg/dL (ref 8–27)
CALCIUM: 9.4 mg/dL (ref 8.7–10.3)
CHLORIDE: 101 mmol/L (ref 96–106)
CO2: 24 mmol/L (ref 18–29)
Creatinine, Ser: 0.68 mg/dL (ref 0.57–1.00)
GFR, EST AFRICAN AMERICAN: 103 mL/min/{1.73_m2} (ref >59)
GFR, EST NON AFRICAN AMERICAN: 90 mL/min/{1.73_m2} (ref >59)
GLUCOSE: 110 mg/dL — AB (ref 65–99)
Globulin, Total: 2.5 g/dL (ref 1.5–4.5)
Potassium: 4.2 mmol/L (ref 3.5–5.2)
Sodium: 141 mmol/L (ref 134–144)
TOTAL PROTEIN: 6.9 g/dL (ref 6.0–8.5)

## 2016-07-21 LAB — LIPID PANEL
CHOL/HDL RATIO: 2.7 ratio (ref 0.0–4.4)
Cholesterol, Total: 158 mg/dL (ref 100–199)
HDL: 59 mg/dL (ref >39)
LDL Calculated: 72 mg/dL (ref 0–99)
Triglycerides: 137 mg/dL (ref 0–149)
VLDL CHOLESTEROL CAL: 27 mg/dL (ref 5–40)

## 2016-07-21 MED ORDER — HYDROCHLOROTHIAZIDE 25 MG PO TABS: 25.0000 mg | ORAL_TABLET | Freq: Every day | ORAL | 1 refills | Status: DC

## 2016-07-21 MED ORDER — METOPROLOL TARTRATE 100 MG PO TABS: 100.0000 mg | ORAL_TABLET | Freq: Two times a day (BID) | ORAL | 1 refills | Status: DC

## 2016-07-21 MED ORDER — SIMVASTATIN 40 MG PO TABS: 40.0000 mg | ORAL_TABLET | Freq: Every day | ORAL | 1 refills | Status: DC

## 2016-07-21 MED ORDER — LISINOPRIL 40 MG PO TABS: 40.0000 mg | ORAL_TABLET | Freq: Every day | ORAL | 1 refills | Status: DC

## 2016-07-21 MED ORDER — CLONIDINE HCL 0.1 MG PO TABS: 0.1000 mg | ORAL_TABLET | Freq: Two times a day (BID) | ORAL | 2 refills | Status: DC

## 2016-07-21 MED ORDER — CLONIDINE HCL 0.2 MG PO TABS: 0.2000 mg | ORAL_TABLET | Freq: Two times a day (BID) | ORAL | 1 refills | Status: DC

## 2016-07-21 NOTE — Addendum Note (Signed)
Addended by: Bennie PieriniMARTIN, MARY-MARGARET on: 07/21/2016 08:51 AM   Modules accepted: Orders

## 2016-07-21 NOTE — Patient Instructions (Signed)
Low-Sodium Eating Plan Sodium raises blood pressure and causes water to be held in the body. Getting less sodium from food will help lower your blood pressure, reduce any swelling, and protect your heart, liver, and kidneys. We get sodium by adding salt (sodium chloride) to food. Most of our sodium comes from canned, boxed, and frozen foods. Restaurant foods, fast foods, and pizza are also very high in sodium. Even if you take medicine to lower your blood pressure or to reduce fluid in your body, getting less sodium from your food is important. What is my plan? Most people should limit their sodium intake to 2,300 mg a day. Your health care provider recommends that you limit your sodium intake to __________ a day. What do I need to know about this eating plan? For the low-sodium eating plan, you will follow these general guidelines:  Choose foods with a % Daily Value for sodium of less than 5% (as listed on the food label).  Use salt-free seasonings or herbs instead of table salt or sea salt.  Check with your health care provider or pharmacist before using salt substitutes.  Eat fresh foods.  Eat more vegetables and fruits.  Limit canned vegetables. If you do use them, rinse them well to decrease the sodium.  Limit cheese to 1 oz (28 g) per day.  Eat lower-sodium products, often labeled as "lower sodium" or "no salt added."  Avoid foods that contain monosodium glutamate (MSG). MSG is sometimes added to Chinese food and some canned foods.  Check food labels (Nutrition Facts labels) on foods to learn how much sodium is in one serving.  Eat more home-cooked food and less restaurant, buffet, and fast food.  When eating at a restaurant, ask that your food be prepared with less salt, or no salt if possible. How do I read food labels for sodium information? The Nutrition Facts label lists the amount of sodium in one serving of the food. If you eat more than one serving, you must multiply the  listed amount of sodium by the number of servings. Food labels may also identify foods as:  Sodium free-Less than 5 mg in a serving.  Very low sodium-35 mg or less in a serving.  Low sodium-140 mg or less in a serving.  Light in sodium-50% less sodium in a serving. For example, if a food that usually has 300 mg of sodium is changed to become light in sodium, it will have 150 mg of sodium.  Reduced sodium-25% less sodium in a serving. For example, if a food that usually has 400 mg of sodium is changed to reduced sodium, it will have 300 mg of sodium. What foods can I eat? Grains  Low-sodium cereals, including oats, puffed wheat and rice, and shredded wheat cereals. Low-sodium crackers. Unsalted rice and pasta. Lower-sodium bread. Vegetables  Frozen or fresh vegetables. Low-sodium or reduced-sodium canned vegetables. Low-sodium or reduced-sodium tomato sauce and paste. Low-sodium or reduced-sodium tomato and vegetable juices. Fruits  Fresh, frozen, and canned fruit. Fruit juice. Meat and Other Protein Products  Low-sodium canned tuna and salmon. Fresh or frozen meat, poultry, seafood, and fish. Lamb. Unsalted nuts. Dried beans, peas, and lentils without added salt. Unsalted canned beans. Homemade soups without salt. Eggs. Dairy  Milk. Soy milk. Ricotta cheese. Low-sodium or reduced-sodium cheeses. Yogurt. Condiments  Fresh and dried herbs and spices. Salt-free seasonings. Onion and garlic powders. Low-sodium varieties of mustard and ketchup. Fresh or refrigerated horseradish. Lemon juice. Fats and Oils  Reduced-sodium   salad dressings. Unsalted butter. Other  Unsalted popcorn and pretzels. The items listed above may not be a complete list of recommended foods or beverages. Contact your dietitian for more options.  What foods are not recommended? Grains  Instant hot cereals. Bread stuffing, pancake, and biscuit mixes. Croutons. Seasoned rice or pasta mixes. Noodle soup cups. Boxed or  frozen macaroni and cheese. Self-rising flour. Regular salted crackers. Vegetables  Regular canned vegetables. Regular canned tomato sauce and paste. Regular tomato and vegetable juices. Frozen vegetables in sauces. Salted French fries. Olives. Pickles. Relishes. Sauerkraut. Salsa. Meat and Other Protein Products  Salted, canned, smoked, spiced, or pickled meats, seafood, or fish. Bacon, ham, sausage, hot dogs, corned beef, chipped beef, and packaged luncheon meats. Salt pork. Jerky. Pickled herring. Anchovies, regular canned tuna, and sardines. Salted nuts. Dairy  Processed cheese and cheese spreads. Cheese curds. Blue cheese and cottage cheese. Buttermilk. Condiments  Onion and garlic salt, seasoned salt, table salt, and sea salt. Canned and packaged gravies. Worcestershire sauce. Tartar sauce. Barbecue sauce. Teriyaki sauce. Soy sauce, including reduced sodium. Steak sauce. Fish sauce. Oyster sauce. Cocktail sauce. Horseradish that you find on the shelf. Regular ketchup and mustard. Meat flavorings and tenderizers. Bouillon cubes. Hot sauce. Tabasco sauce. Marinades. Taco seasonings. Relishes. Fats and Oils  Regular salad dressings. Salted butter. Margarine. Ghee. Bacon fat. Other  Potato and tortilla chips. Corn chips and puffs. Salted popcorn and pretzels. Canned or dried soups. Pizza. Frozen entrees and pot pies. The items listed above may not be a complete list of foods and beverages to avoid. Contact your dietitian for more information.  This information is not intended to replace advice given to you by your health care provider. Make sure you discuss any questions you have with your health care provider. Document Released: 01/17/2002 Document Revised: 01/03/2016 Document Reviewed: 06/01/2013 Elsevier Interactive Patient Education  2017 Elsevier Inc.  

## 2016-07-21 NOTE — Progress Notes (Addendum)
Subjective:    Patient ID: Deanna Becker, female    DOB: [birth year], 70 y.o.   MRN: [id number]   Patient here today for follow up of chronic medical problems.  Outpatient Encounter Prescriptions as of 07/21/2016  Medication Sig  . aspirin EC 81 MG tablet Take 1 tablet (81 mg total) by mouth daily.  . cloNIDine (CATAPRES) 0.1 MG tablet Take 1 tablet (0.1 mg total) by mouth 2 (two) times daily.  . hydrochlorothiazide (HYDRODIURIL) 25 MG tablet Take 1 tablet (25 mg total) by mouth daily.  Marland Kitchen lisinopril (PRINIVIL,ZESTRIL) 40 MG tablet Take 1 tablet (40 mg total) by mouth daily.  Marland Kitchen loratadine (CLARITIN) 10 MG tablet Take 10 mg by mouth every other day.  . metoprolol (LOPRESSOR) 100 MG tablet Take 1 tablet (100 mg total) by mouth 2 (two) times daily.  . simvastatin (ZOCOR) 40 MG tablet Take 1 tablet (40 mg total) by mouth at bedtime.   No facility-administered encounter medications on file as of 07/21/2016.     Hypertension  This is a chronic problem. The current episode started more than 1 year ago. The problem is unchanged. The problem is uncontrolled. Pertinent negatives include no chest pain, headaches, palpitations or shortness of breath. Risk factors for coronary artery disease include dyslipidemia. Past treatments include ACE inhibitors, diuretics and beta blockers. The current treatment provides mild improvement.  Hyperlipidemia  This is a chronic problem. The current episode started more than 1 year ago. The problem is uncontrolled. Recent lipid tests were reviewed and are normal. Pertinent negatives include no chest pain or shortness of breath. Current antihyperlipidemic treatment includes statins. The current treatment provides mild improvement of lipids. Compliance problems include adherence to diet and adherence to exercise.  Risk factors for coronary artery disease include dyslipidemia and hypertension.  allergic rhinitis Currently on Claritin OTC which seems to be working for  symptoms.   Review of Systems  Constitutional: Negative.   HENT: Negative.   Respiratory: Negative for shortness of breath.   Cardiovascular: Negative for chest pain and palpitations.  Genitourinary: Negative.   Musculoskeletal: Negative.   Neurological: Negative.  Negative for headaches.  Psychiatric/Behavioral: Negative.   All other systems reviewed and are negative.      Objective:   Physical Exam  Constitutional: She is oriented to person, place, and time. She appears well-developed and well-nourished.  HENT:  Nose: Nose normal.  Mouth/Throat: Oropharynx is clear and moist.  Eyes: EOM are normal.  Neck: Trachea normal, normal range of motion and full passive range of motion without pain. Neck supple. No JVD present. Carotid bruit is not present. No thyromegaly present.  Cardiovascular: Normal rate, regular rhythm, normal heart sounds and intact distal pulses.  Exam reveals no gallop and no friction rub.   No murmur heard. Pulmonary/Chest: Effort normal and breath sounds normal.  Abdominal: Soft. Bowel sounds are normal. She exhibits no distension and no mass. There is no tenderness.  Musculoskeletal: Normal range of motion.  Lymphadenopathy:    She has no cervical adenopathy.  Neurological: She is alert and oriented to person, place, and time. She has normal reflexes.  Skin: Skin is warm and dry.  Psychiatric: She has a normal mood and affect. Her behavior is normal. Judgment and thought content normal.   BP (!) 187/76   Pulse 68   Temp 97.2 F (36.2 C) (Oral)   Ht _0  (1.575 m)   Wt 177 lb (80.3 kg)   BMI 32.37 kg/m  Assessment & Plan:  1. Essential hypertension Increase clonidine to 0.43m BID Keep diary of blood sugars at home - lisinopril (PRINIVIL,ZESTRIL) 40 MG tablet; Take 1 tablet (40 mg total) by mouth daily.  Dispense: 90 tablet; Refill: 1 - hydrochlorothiazide (HYDRODIURIL) 25 MG tablet; Take 1 tablet (25 mg total) by mouth daily.  Dispense:  90 tablet; Refill: 1 - metoprolol (LOPRESSOR) 100 MG tablet; Take 1 tablet (100 mg total) by mouth 2 (two) times daily.  Dispense: 180 tablet; Refill: 1 - cloNIDine (CATAPRES) 0.2 MG tablet; Take 1 tablet (0.2 mg total) by mouth 2 (two) times daily.  Dispense: 180 tablet; Refill: 1  2. BMI 32.0-32.9,adult Discussed diet and exercise for person with BMI >25 Will recheck weight in 3-6 months  3. Hyperlipidemia with target LDL less than 100 Low fat diet - simvastatin (ZOCOR) 40 MG tablet; Take 1 tablet (40 mg total) by mouth at bedtime.  Dispense: 90 tablet; Refill: 1  Orders Placed This Encounter  Procedures  . CMP14+EGFR  . Lipid panel     Labs pending Health maintenance reviewed Diet and exercise encouraged Continue all meds Follow up  In 3 month   MBenson FNP

## 2016-10-20 ENCOUNTER — Ambulatory Visit: Payer: Medicare Other | Admitting: Nurse Practitioner

## 2016-12-04 ENCOUNTER — Encounter: Payer: Self-pay | Admitting: Nurse Practitioner

## 2016-12-04 ENCOUNTER — Ambulatory Visit (INDEPENDENT_AMBULATORY_CARE_PROVIDER_SITE_OTHER): Payer: Medicare Other | Admitting: Nurse Practitioner

## 2016-12-04 VITALS — BP 176/88 | HR 72 | Temp 96.9°F | Ht 62.0 in | Wt 175.0 lb

## 2016-12-04 DIAGNOSIS — E785 Hyperlipidemia, unspecified: Secondary | ICD-10-CM

## 2016-12-04 DIAGNOSIS — I10 Essential (primary) hypertension: Secondary | ICD-10-CM

## 2016-12-04 DIAGNOSIS — Z6832 Body mass index (BMI) 32.0-32.9, adult: Secondary | ICD-10-CM

## 2016-12-04 LAB — CMP14+EGFR
A/G RATIO: 2 (ref 1.2–2.2)
ALK PHOS: 73 IU/L (ref 39–117)
ALT: 18 IU/L (ref 0–32)
AST: 15 IU/L (ref 0–40)
Albumin: 4.9 g/dL — ABNORMAL HIGH (ref 3.6–4.8)
BUN/Creatinine Ratio: 24 (ref 12–28)
BUN: 18 mg/dL (ref 8–27)
Bilirubin Total: 1.6 mg/dL — ABNORMAL HIGH (ref 0.0–1.2)
CALCIUM: 9.8 mg/dL (ref 8.7–10.3)
CO2: 24 mmol/L (ref 18–29)
Chloride: 96 mmol/L (ref 96–106)
Creatinine, Ser: 0.74 mg/dL (ref 0.57–1.00)
GFR calc Af Amer: 96 mL/min/{1.73_m2} (ref >59)
GFR, EST NON AFRICAN AMERICAN: 83 mL/min/{1.73_m2} (ref >59)
Globulin, Total: 2.5 g/dL (ref 1.5–4.5)
Glucose: 115 mg/dL — ABNORMAL HIGH (ref 65–99)
POTASSIUM: 4.3 mmol/L (ref 3.5–5.2)
Sodium: 137 mmol/L (ref 134–144)
Total Protein: 7.4 g/dL (ref 6.0–8.5)

## 2016-12-04 LAB — LIPID PANEL
CHOL/HDL RATIO: 3 ratio (ref 0.0–4.4)
CHOLESTEROL TOTAL: 175 mg/dL (ref 100–199)
HDL: 58 mg/dL (ref >39)
LDL Calculated: 93 mg/dL (ref 0–99)
TRIGLYCERIDES: 119 mg/dL (ref 0–149)
VLDL Cholesterol Cal: 24 mg/dL (ref 5–40)

## 2016-12-04 MED ORDER — LISINOPRIL 40 MG PO TABS: 40.0000 mg | ORAL_TABLET | Freq: Every day | ORAL | 1 refills | Status: DC

## 2016-12-04 MED ORDER — METOPROLOL TARTRATE 100 MG PO TABS: 100.0000 mg | ORAL_TABLET | Freq: Two times a day (BID) | ORAL | 1 refills | Status: DC

## 2016-12-04 MED ORDER — CLONIDINE HCL 0.2 MG PO TABS: 0.2000 mg | ORAL_TABLET | Freq: Two times a day (BID) | ORAL | 1 refills | Status: DC

## 2016-12-04 MED ORDER — SIMVASTATIN 40 MG PO TABS: 40.0000 mg | ORAL_TABLET | Freq: Every day | ORAL | 1 refills | Status: DC

## 2016-12-04 MED ORDER — HYDROCHLOROTHIAZIDE 25 MG PO TABS: 25.0000 mg | ORAL_TABLET | Freq: Every day | ORAL | 1 refills | Status: DC

## 2016-12-04 NOTE — Patient Instructions (Signed)
DASH Eating Plan DASH stands for "Dietary Approaches to Stop Hypertension." The DASH eating plan is a healthy eating plan that has been shown to reduce high blood pressure (hypertension). It may also reduce your risk for type 2 diabetes, heart disease, and stroke. The DASH eating plan may also help with weight loss. What are tips for following this plan? General guidelines  Avoid eating more than 2,300 mg (milligrams) of salt (sodium) a day. If you have hypertension, you may need to reduce your sodium intake to 1,500 mg a day.  Limit alcohol intake to no more than 1 drink a day for nonpregnant women and 2 drinks a day for men. One drink equals 12 oz of beer, 5 oz of wine, or 1 oz of hard liquor.  Work with your health care provider to maintain a healthy body weight or to lose weight. Ask what an ideal weight is for you.  Get at least 30 minutes of exercise that causes your heart to beat faster (aerobic exercise) most days of the week. Activities may include walking, swimming, or biking.  Work with your health care provider or diet and nutrition specialist (dietitian) to adjust your eating plan to your individual calorie needs. Reading food labels  Check food labels for the amount of sodium per serving. Choose foods with less than 5 percent of the Daily Value of sodium. Generally, foods with less than 300 mg of sodium per serving fit into this eating plan.  To find whole grains, look for the word "whole" as the first word in the ingredient list. Shopping  Buy products labeled as "low-sodium" or "no salt added."  Buy fresh foods. Avoid canned foods and premade or frozen meals. Cooking  Avoid adding salt when cooking. Use salt-free seasonings or herbs instead of table salt or sea salt. Check with your health care provider or pharmacist before using salt substitutes.  Do not fry foods. Cook foods using healthy methods such as baking, boiling, grilling, and broiling instead.  Cook with  heart-healthy oils, such as olive, canola, soybean, or sunflower oil. Meal planning   Eat a balanced diet that includes: ? 5 or more servings of fruits and vegetables each day. At each meal, try to fill half of your plate with fruits and vegetables. ? Up to 6-8 servings of whole grains each day. ? Less than 6 oz of lean meat, poultry, or fish each day. A 3-oz serving of meat is about the same size as a deck of cards. One egg equals 1 oz. ? 2 servings of low-fat dairy each day. ? A serving of nuts, seeds, or beans 5 times each week. ? Heart-healthy fats. Healthy fats called Omega-3 fatty acids are found in foods such as flaxseeds and coldwater fish, like sardines, salmon, and mackerel.  Limit how much you eat of the following: ? Canned or prepackaged foods. ? Food that is high in trans fat, such as fried foods. ? Food that is high in saturated fat, such as fatty meat. ? Sweets, desserts, sugary drinks, and other foods with added sugar. ? Full-fat dairy products.  Do not salt foods before eating.  Try to eat at least 2 vegetarian meals each week.  Eat more home-cooked food and less restaurant, buffet, and fast food.  When eating at a restaurant, ask that your food be prepared with less salt or no salt, if possible. What foods are recommended? The items listed may not be a complete list. Talk with your dietitian about what   dietary choices are best for you. Grains Whole-grain or whole-wheat bread. Whole-grain or whole-wheat pasta. Brown rice. Oatmeal. Quinoa. Bulgur. Whole-grain and low-sodium cereals. Pita bread. Low-fat, low-sodium crackers. Whole-wheat flour tortillas. Vegetables Fresh or frozen vegetables (raw, steamed, roasted, or grilled). Low-sodium or reduced-sodium tomato and vegetable juice. Low-sodium or reduced-sodium tomato sauce and tomato paste. Low-sodium or reduced-sodium canned vegetables. Fruits All fresh, dried, or frozen fruit. Canned fruit in natural juice (without  added sugar). Meat and other protein foods Skinless chicken or turkey. Ground chicken or turkey. Pork with fat trimmed off. Fish and seafood. Egg whites. Dried beans, peas, or lentils. Unsalted nuts, nut butters, and seeds. Unsalted canned beans. Lean cuts of beef with fat trimmed off. Low-sodium, lean deli meat. Dairy Low-fat (1%) or fat-free (skim) milk. Fat-free, low-fat, or reduced-fat cheeses. Nonfat, low-sodium ricotta or cottage cheese. Low-fat or nonfat yogurt. Low-fat, low-sodium cheese. Fats and oils Soft margarine without trans fats. Vegetable oil. Low-fat, reduced-fat, or light mayonnaise and salad dressings (reduced-sodium). Canola, safflower, olive, soybean, and sunflower oils. Avocado. Seasoning and other foods Herbs. Spices. Seasoning mixes without salt. Unsalted popcorn and pretzels. Fat-free sweets. What foods are not recommended? The items listed may not be a complete list. Talk with your dietitian about what dietary choices are best for you. Grains Baked goods made with fat, such as croissants, muffins, or some breads. Dry pasta or rice meal packs. Vegetables Creamed or fried vegetables. Vegetables in a cheese sauce. Regular canned vegetables (not low-sodium or reduced-sodium). Regular canned tomato sauce and paste (not low-sodium or reduced-sodium). Regular tomato and vegetable juice (not low-sodium or reduced-sodium). Pickles. Olives. Fruits Canned fruit in a light or heavy syrup. Fried fruit. Fruit in cream or butter sauce. Meat and other protein foods Fatty cuts of meat. Ribs. Fried meat. Bacon. Sausage. Bologna and other processed lunch meats. Salami. Fatback. Hotdogs. Bratwurst. Salted nuts and seeds. Canned beans with added salt. Canned or smoked fish. Whole eggs or egg yolks. Chicken or turkey with skin. Dairy Whole or 2% milk, cream, and half-and-half. Whole or full-fat cream cheese. Whole-fat or sweetened yogurt. Full-fat cheese. Nondairy creamers. Whipped toppings.  Processed cheese and cheese spreads. Fats and oils Butter. Stick margarine. Lard. Shortening. Ghee. Bacon fat. Tropical oils, such as coconut, palm kernel, or palm oil. Seasoning and other foods Salted popcorn and pretzels. Onion salt, garlic salt, seasoned salt, table salt, and sea salt. Worcestershire sauce. Tartar sauce. Barbecue sauce. Teriyaki sauce. Soy sauce, including reduced-sodium. Steak sauce. Canned and packaged gravies. Fish sauce. Oyster sauce. Cocktail sauce. Horseradish that you find on the shelf. Ketchup. Mustard. Meat flavorings and tenderizers. Bouillon cubes. Hot sauce and Tabasco sauce. Premade or packaged marinades. Premade or packaged taco seasonings. Relishes. Regular salad dressings. Where to find more information:  National Heart, Lung, and Blood Institute: www.nhlbi.nih.gov  American Heart Association: www.heart.org Summary  The DASH eating plan is a healthy eating plan that has been shown to reduce high blood pressure (hypertension). It may also reduce your risk for type 2 diabetes, heart disease, and stroke.  With the DASH eating plan, you should limit salt (sodium) intake to 2,300 mg a day. If you have hypertension, you may need to reduce your sodium intake to 1,500 mg a day.  When on the DASH eating plan, aim to eat more fresh fruits and vegetables, whole grains, lean proteins, low-fat dairy, and heart-healthy fats.  Work with your health care provider or diet and nutrition specialist (dietitian) to adjust your eating plan to your individual   calorie needs. This information is not intended to replace advice given to you by your health care provider. Make sure you discuss any questions you have with your health care provider. Document Released: 07/17/2011 Document Revised: 07/21/2016 Document Reviewed: 07/21/2016 Elsevier Interactive Patient Education  2017 Elsevier Inc.  

## 2016-12-04 NOTE — Progress Notes (Signed)
Subjective:    Patient ID: Deanna Becker, female    DOB: 01/26/1946, 71 y.o.   MRN: [id number]  HPI  Deanna Becker is here today for follow up of chronic medical problem.  Outpatient Encounter Prescriptions as of 12/04/2016  Medication Sig  . aspirin EC 81 MG tablet Take 1 tablet (81 mg total) by mouth daily.  . cloNIDine (CATAPRES) 0.2 MG tablet Take 1 tablet (0.2 mg total) by mouth 2 (two) times daily.  . hydrochlorothiazide (HYDRODIURIL) 25 MG tablet Take 1 tablet (25 mg total) by mouth daily.  Marland Kitchen lisinopril (PRINIVIL,ZESTRIL) 40 MG tablet Take 1 tablet (40 mg total) by mouth daily.  Marland Kitchen loratadine (CLARITIN) 10 MG tablet Take 10 mg by mouth every other day.  . metoprolol (LOPRESSOR) 100 MG tablet Take 1 tablet (100 mg total) by mouth 2 (two) times daily.  . simvastatin (ZOCOR) 40 MG tablet Take 1 tablet (40 mg total) by mouth at bedtime.   No facility-administered encounter medications on file as of 12/04/2016.     1. Essential hypertension  Patient taking clonidine, HCTZ, lisinopril, and metoprolol for mangement.  2. BMI 32.0-32.9,adult  No recent weight gain or loss.  3. Hyperlipidemia with target LDL less than 100  Patient taking simvastatin for management.    New complaints: None today.     Review of Systems  Constitutional: Negative.   HENT: Negative.   Respiratory: Negative for shortness of breath.   Cardiovascular: Negative for chest pain and palpitations.  Genitourinary: Negative.   Musculoskeletal: Negative.   Neurological: Negative.  Negative for headaches.  Psychiatric/Behavioral: Negative.   All other systems reviewed and are negative.      Objective:   Physical Exam  Constitutional: She is oriented to person, place, and time. She appears well-developed and well-nourished.  HENT:  Head: Normocephalic.  Right Ear: External ear normal.  Left Ear: External ear normal.  Nose: Nose normal.  Mouth/Throat: Oropharynx is clear and moist.  Eyes: Pupils  are equal, round, and reactive to light.  Neck: Normal range of motion. Neck supple.  Cardiovascular: Normal rate, regular rhythm, normal heart sounds and intact distal pulses.   No murmur heard. Pulmonary/Chest: Effort normal and breath sounds normal.  Abdominal: Soft. Bowel sounds are normal.  Musculoskeletal: Normal range of motion.  Neurological: She is alert and oriented to person, place, and time.  Skin: Skin is warm and dry.  Psychiatric: She has a normal mood and affect. Her behavior is normal. Judgment and thought content normal.  Vitals reviewed.  BP (!) 176/88   Pulse 72   Temp (!) 96.9 F (36.1 C) (Oral)   Ht '5\' 2"'  (1.575 m)   Wt 175 lb (79.4 kg)   BMI 32.01 kg/m         Assessment & Plan:    1. Essential hypertension Low sodium diet Keep diary of blood pressures at home - lisinopril (PRINIVIL,ZESTRIL) 40 MG tablet; Take 1 tablet (40 mg total) by mouth daily.  Dispense: 90 tablet; Refill: 1 - hydrochlorothiazide (HYDRODIURIL) 25 MG tablet; Take 1 tablet (25 mg total) by mouth daily.  Dispense: 90 tablet; Refill: 1 - metoprolol (LOPRESSOR) 100 MG tablet; Take 1 tablet (100 mg total) by mouth 2 (two) times daily.  Dispense: 180 tablet; Refill: 1 - cloNIDine (CATAPRES) 0.2 MG tablet; Take 1 tablet (0.2 mg total) by mouth 2 (two) times daily.  Dispense: 180 tablet; Refill: 1 - CMP14+EGFR  2. BMI 32.0-32.9,adult Discussed diet and exercise for person with BMI >  25 Will recheck weight in 3-6 months  3. Hyperlipidemia with target LDL less than 100 Low fta diet - simvastatin (ZOCOR) 40 MG tablet; Take 1 tablet (40 mg total) by mouth at bedtime.  Dispense: 90 tablet; Refill: 1 - Lipid panel    Labs pending Health maintenance reviewed Diet and exercise encouraged Continue all meds Follow up  In 6 months   [place], FNP

## 2017-03-09 ENCOUNTER — Ambulatory Visit: Payer: Medicare Other | Admitting: Nurse Practitioner

## 2017-06-05 ENCOUNTER — Encounter: Payer: Self-pay | Admitting: Nurse Practitioner

## 2017-06-05 ENCOUNTER — Ambulatory Visit (INDEPENDENT_AMBULATORY_CARE_PROVIDER_SITE_OTHER): Payer: Medicare Other | Admitting: Nurse Practitioner

## 2017-06-05 VITALS — BP 180/88 | HR 76 | Temp 97.2°F | Ht 62.0 in | Wt 177.0 lb

## 2017-06-05 DIAGNOSIS — E785 Hyperlipidemia, unspecified: Secondary | ICD-10-CM

## 2017-06-05 DIAGNOSIS — Z1211 Encounter for screening for malignant neoplasm of colon: Secondary | ICD-10-CM | POA: Diagnosis not present

## 2017-06-05 DIAGNOSIS — I10 Essential (primary) hypertension: Secondary | ICD-10-CM

## 2017-06-05 DIAGNOSIS — J309 Allergic rhinitis, unspecified: Secondary | ICD-10-CM | POA: Diagnosis not present

## 2017-06-05 DIAGNOSIS — Z6832 Body mass index (BMI) 32.0-32.9, adult: Secondary | ICD-10-CM

## 2017-06-05 DIAGNOSIS — Z13820 Encounter for screening for osteoporosis: Secondary | ICD-10-CM

## 2017-06-05 DIAGNOSIS — Z1212 Encounter for screening for malignant neoplasm of rectum: Secondary | ICD-10-CM

## 2017-06-05 MED ORDER — SIMVASTATIN 40 MG PO TABS: 40.0000 mg | ORAL_TABLET | Freq: Every day | ORAL | 1 refills | Status: DC

## 2017-06-05 MED ORDER — HYDROCHLOROTHIAZIDE 25 MG PO TABS: 25.0000 mg | ORAL_TABLET | Freq: Every day | ORAL | 1 refills | Status: DC

## 2017-06-05 MED ORDER — METOPROLOL TARTRATE 100 MG PO TABS: 100.0000 mg | ORAL_TABLET | Freq: Two times a day (BID) | ORAL | 1 refills | Status: DC

## 2017-06-05 MED ORDER — LISINOPRIL 40 MG PO TABS: 40.0000 mg | ORAL_TABLET | Freq: Every day | ORAL | 1 refills | Status: DC

## 2017-06-05 MED ORDER — CLONIDINE HCL 0.2 MG PO TABS: 0.2000 mg | ORAL_TABLET | Freq: Two times a day (BID) | ORAL | 1 refills | Status: DC

## 2017-06-05 NOTE — Addendum Note (Signed)
Addended by: Bennie PieriniMARTIN, MARY-MARGARET on: 06/05/2017 10:11 AM   Modules accepted: Orders

## 2017-06-05 NOTE — Patient Instructions (Signed)
DASH Eating Plan DASH stands for "Dietary Approaches to Stop Hypertension." The DASH eating plan is a healthy eating plan that has been shown to reduce high blood pressure (hypertension). It may also reduce your risk for type 2 diabetes, heart disease, and stroke. The DASH eating plan may also help with weight loss. What are tips for following this plan? General guidelines  Avoid eating more than 2,300 mg (milligrams) of salt (sodium) a day. If you have hypertension, you may need to reduce your sodium intake to 1,500 mg a day.  Limit alcohol intake to no more than 1 drink a day for nonpregnant women and 2 drinks a day for men. One drink equals 12 oz of beer, 5 oz of wine, or 1 oz of hard liquor.  Work with your health care provider to maintain a healthy body weight or to lose weight. Ask what an ideal weight is for you.  Get at least 30 minutes of exercise that causes your heart to beat faster (aerobic exercise) most days of the week. Activities may include walking, swimming, or biking.  Work with your health care provider or diet and nutrition specialist (dietitian) to adjust your eating plan to your individual calorie needs. Reading food labels  Check food labels for the amount of sodium per serving. Choose foods with less than 5 percent of the Daily Value of sodium. Generally, foods with less than 300 mg of sodium per serving fit into this eating plan.  To find whole grains, look for the word "whole" as the first word in the ingredient list. Shopping  Buy products labeled as "low-sodium" or "no salt added."  Buy fresh foods. Avoid canned foods and premade or frozen meals. Cooking  Avoid adding salt when cooking. Use salt-free seasonings or herbs instead of table salt or sea salt. Check with your health care provider or pharmacist before using salt substitutes.  Do not fry foods. Cook foods using healthy methods such as baking, boiling, grilling, and broiling instead.  Cook with  heart-healthy oils, such as olive, canola, soybean, or sunflower oil. Meal planning   Eat a balanced diet that includes: ? 5 or more servings of fruits and vegetables each day. At each meal, try to fill half of your plate with fruits and vegetables. ? Up to 6-8 servings of whole grains each day. ? Less than 6 oz of lean meat, poultry, or fish each day. A 3-oz serving of meat is about the same size as a deck of cards. One egg equals 1 oz. ? 2 servings of low-fat dairy each day. ? A serving of nuts, seeds, or beans 5 times each week. ? Heart-healthy fats. Healthy fats called Omega-3 fatty acids are found in foods such as flaxseeds and coldwater fish, like sardines, salmon, and mackerel.  Limit how much you eat of the following: ? Canned or prepackaged foods. ? Food that is high in trans fat, such as fried foods. ? Food that is high in saturated fat, such as fatty meat. ? Sweets, desserts, sugary drinks, and other foods with added sugar. ? Full-fat dairy products.  Do not salt foods before eating.  Try to eat at least 2 vegetarian meals each week.  Eat more home-cooked food and less restaurant, buffet, and fast food.  When eating at a restaurant, ask that your food be prepared with less salt or no salt, if possible. What foods are recommended? The items listed may not be a complete list. Talk with your dietitian about what   dietary choices are best for you. Grains Whole-grain or whole-wheat bread. Whole-grain or whole-wheat pasta. Brown rice. Oatmeal. Quinoa. Bulgur. Whole-grain and low-sodium cereals. Pita bread. Low-fat, low-sodium crackers. Whole-wheat flour tortillas. Vegetables Fresh or frozen vegetables (raw, steamed, roasted, or grilled). Low-sodium or reduced-sodium tomato and vegetable juice. Low-sodium or reduced-sodium tomato sauce and tomato paste. Low-sodium or reduced-sodium canned vegetables. Fruits All fresh, dried, or frozen fruit. Canned fruit in natural juice (without  added sugar). Meat and other protein foods Skinless chicken or turkey. Ground chicken or turkey. Pork with fat trimmed off. Fish and seafood. Egg whites. Dried beans, peas, or lentils. Unsalted nuts, nut butters, and seeds. Unsalted canned beans. Lean cuts of beef with fat trimmed off. Low-sodium, lean deli meat. Dairy Low-fat (1%) or fat-free (skim) milk. Fat-free, low-fat, or reduced-fat cheeses. Nonfat, low-sodium ricotta or cottage cheese. Low-fat or nonfat yogurt. Low-fat, low-sodium cheese. Fats and oils Soft margarine without trans fats. Vegetable oil. Low-fat, reduced-fat, or light mayonnaise and salad dressings (reduced-sodium). Canola, safflower, olive, soybean, and sunflower oils. Avocado. Seasoning and other foods Herbs. Spices. Seasoning mixes without salt. Unsalted popcorn and pretzels. Fat-free sweets. What foods are not recommended? The items listed may not be a complete list. Talk with your dietitian about what dietary choices are best for you. Grains Baked goods made with fat, such as croissants, muffins, or some breads. Dry pasta or rice meal packs. Vegetables Creamed or fried vegetables. Vegetables in a cheese sauce. Regular canned vegetables (not low-sodium or reduced-sodium). Regular canned tomato sauce and paste (not low-sodium or reduced-sodium). Regular tomato and vegetable juice (not low-sodium or reduced-sodium). Pickles. Olives. Fruits Canned fruit in a light or heavy syrup. Fried fruit. Fruit in cream or butter sauce. Meat and other protein foods Fatty cuts of meat. Ribs. Fried meat. Bacon. Sausage. Bologna and other processed lunch meats. Salami. Fatback. Hotdogs. Bratwurst. Salted nuts and seeds. Canned beans with added salt. Canned or smoked fish. Whole eggs or egg yolks. Chicken or turkey with skin. Dairy Whole or 2% milk, cream, and half-and-half. Whole or full-fat cream cheese. Whole-fat or sweetened yogurt. Full-fat cheese. Nondairy creamers. Whipped toppings.  Processed cheese and cheese spreads. Fats and oils Butter. Stick margarine. Lard. Shortening. Ghee. Bacon fat. Tropical oils, such as coconut, palm kernel, or palm oil. Seasoning and other foods Salted popcorn and pretzels. Onion salt, garlic salt, seasoned salt, table salt, and sea salt. Worcestershire sauce. Tartar sauce. Barbecue sauce. Teriyaki sauce. Soy sauce, including reduced-sodium. Steak sauce. Canned and packaged gravies. Fish sauce. Oyster sauce. Cocktail sauce. Horseradish that you find on the shelf. Ketchup. Mustard. Meat flavorings and tenderizers. Bouillon cubes. Hot sauce and Tabasco sauce. Premade or packaged marinades. Premade or packaged taco seasonings. Relishes. Regular salad dressings. Where to find more information:  National Heart, Lung, and Blood Institute: www.nhlbi.nih.gov  American Heart Association: www.heart.org Summary  The DASH eating plan is a healthy eating plan that has been shown to reduce high blood pressure (hypertension). It may also reduce your risk for type 2 diabetes, heart disease, and stroke.  With the DASH eating plan, you should limit salt (sodium) intake to 2,300 mg a day. If you have hypertension, you may need to reduce your sodium intake to 1,500 mg a day.  When on the DASH eating plan, aim to eat more fresh fruits and vegetables, whole grains, lean proteins, low-fat dairy, and heart-healthy fats.  Work with your health care provider or diet and nutrition specialist (dietitian) to adjust your eating plan to your individual   calorie needs. This information is not intended to replace advice given to you by your health care provider. Make sure you discuss any questions you have with your health care provider. Document Released: 07/17/2011 Document Revised: 07/21/2016 Document Reviewed: 07/21/2016 Elsevier Interactive Patient Education  2017 Elsevier Inc.  

## 2017-06-05 NOTE — Progress Notes (Addendum)
Subjective:    Patient ID: [name], female    DOB: [birth year], 71 y.o.   MRN: [id number]  HPI   Deanna Becker is here today for follow up of chronic medical problem.  Outpatient Encounter Prescriptions as of 06/05/2017  Medication Sig  . aspirin EC 81 MG tablet Take 1 tablet (81 mg total) by mouth daily.  . cloNIDine (CATAPRES) 0.2 MG tablet Take 1 tablet (0.2 mg total) by mouth 2 (two) times daily.  . hydrochlorothiazide (HYDRODIURIL) 25 MG tablet Take 1 tablet (25 mg total) by mouth daily.  Marland Kitchen lisinopril (PRINIVIL,ZESTRIL) 40 MG tablet Take 1 tablet (40 mg total) by mouth daily.  Marland Kitchen loratadine (CLARITIN) 10 MG tablet Take 10 mg by mouth every other day.  . metoprolol (LOPRESSOR) 100 MG tablet Take 1 tablet (100 mg total) by mouth 2 (two) times daily.  . simvastatin (ZOCOR) 40 MG tablet Take 1 tablet (40 mg total) by mouth at bedtime.   No facility-administered encounter medications on file as of 06/05/2017.     1. Essential hypertension  No c/o chest pain , sob or headache. Does not check blood pressures at home. But says she gets nervous when she comes  in here BP Readings from Last 3 Encounters:  12/04/16 (!) 176/88  07/21/16 (!) 187/76  01/17/16 (!) 152/88     2. Allergic rhinitis, unspecified seasonality, unspecified trigger  Takes Claritin daily  3. Hyperlipidemia with target LDL less than 100  Does not watch diet  4. BMI 32.0-32.9,adult  No recent weight changes    New complaints: None today  Social history: Retired- husband still living   Review of Systems  Constitutional: Negative for activity change and appetite change.  HENT: Negative.   Eyes: Negative for pain.  Respiratory: Negative for shortness of breath.   Cardiovascular: Negative for chest pain, palpitations and leg swelling.  Gastrointestinal: Negative for abdominal pain.  Endocrine: Negative for polydipsia.  Genitourinary: Negative.   Skin: Negative for rash.  Neurological: Negative  for dizziness, weakness and headaches.  Hematological: Does not bruise/bleed easily.  Psychiatric/Behavioral: Negative.   All other systems reviewed and are negative.      Objective:   Physical Exam  Constitutional: She is oriented to person, place, and time. She appears well-developed and well-nourished.  HENT:  Nose: Nose normal.  Mouth/Throat: Oropharynx is clear and moist.  Eyes: EOM are normal.  Neck: Trachea normal, normal range of motion and full passive range of motion without pain. Neck supple. No JVD present. Carotid bruit is not present. No thyromegaly present.  Cardiovascular: Normal rate, regular rhythm and intact distal pulses.  Exam reveals no gallop and no friction rub.   Murmur (2/6 systlic murmur heard loudest at pulmonic valve) heard. Pulmonary/Chest: Effort normal and breath sounds normal.  Abdominal: Soft. Bowel sounds are normal. She exhibits no distension and no mass. There is no tenderness.  Musculoskeletal: Normal range of motion.  Lymphadenopathy:    She has no cervical adenopathy.  Neurological: She is alert and oriented to person, place, and time. She has normal reflexes.  Skin: Skin is warm and dry.  Psychiatric: She has a normal mood and affect. Her behavior is normal. Judgment and thought content normal.   BP (!) 180/88   Pulse 76   Temp (!) 97.2 F (36.2 C) (Oral)   Ht _0  (1.575 m)   Wt 177 lb (80.3 kg)   BMI 32.37 kg/m       Assessment & Plan:  1. Essential hypertension Keep diary of blood pressure at random times throiughout day and bring to me next week - CMP14+EGFR - lisinopril (PRINIVIL,ZESTRIL) 40 MG tablet; Take 1 tablet (40 mg total) by mouth daily.  Dispense: 90 tablet; Refill: 1 - hydrochlorothiazide (HYDRODIURIL) 25 MG tablet; Take 1 tablet (25 mg total) by mouth daily.  Dispense: 90 tablet; Refill: 1 - metoprolol tartrate (LOPRESSOR) 100 MG tablet; Take 1 tablet (100 mg total) by mouth 2 (two) times daily.  Dispense: 180  tablet; Refill: 1 - cloNIDine (CATAPRES) 0.2 MG tablet; Take 1 tablet (0.2 mg total) by mouth 2 (two) times daily.  Dispense: 180 tablet; Refill: 1  2. Allergic rhinitis, unspecified seasonality, unspecified trigger Continue claritin as needed  3. Hyperlipidemia with target LDL less than 100 Low fat diet - Lipid panel - simvastatin (ZOCOR) 40 MG tablet; Take 1 tablet (40 mg total) by mouth at bedtime.  Dispense: 90 tablet; Refill: 1  4. BMI 32.0-32.9,adult Discussed diet and exercise for person with BMI >25 Will recheck weight in 3-6 months     Labs pending Health maintenance reviewed Diet and exercise encouraged Continue all meds Follow up  In 3 months   Peterson, FNP

## 2017-06-06 LAB — CMP14+EGFR
ALBUMIN: 4.6 g/dL (ref 3.5–4.8)
ALT: 16 IU/L (ref 0–32)
AST: 17 IU/L (ref 0–40)
Albumin/Globulin Ratio: 1.6 (ref 1.2–2.2)
Alkaline Phosphatase: 83 IU/L (ref 39–117)
BILIRUBIN TOTAL: 1.4 mg/dL — AB (ref 0.0–1.2)
BUN / CREAT RATIO: 22 (ref 12–28)
BUN: 16 mg/dL (ref 8–27)
CALCIUM: 9.6 mg/dL (ref 8.7–10.3)
CHLORIDE: 97 mmol/L (ref 96–106)
CO2: 24 mmol/L (ref 20–29)
CREATININE: 0.74 mg/dL (ref 0.57–1.00)
GFR, EST AFRICAN AMERICAN: 95 mL/min/{1.73_m2} (ref >59)
GFR, EST NON AFRICAN AMERICAN: 82 mL/min/{1.73_m2} (ref >59)
GLUCOSE: 108 mg/dL — AB (ref 65–99)
Globulin, Total: 2.8 g/dL (ref 1.5–4.5)
Potassium: 4.7 mmol/L (ref 3.5–5.2)
Sodium: 137 mmol/L (ref 134–144)
TOTAL PROTEIN: 7.4 g/dL (ref 6.0–8.5)

## 2017-06-06 LAB — LIPID PANEL
CHOL/HDL RATIO: 3 ratio (ref 0.0–4.4)
Cholesterol, Total: 177 mg/dL (ref 100–199)
HDL: 59 mg/dL (ref >39)
LDL CALC: 74 mg/dL (ref 0–99)
Triglycerides: 219 mg/dL — ABNORMAL HIGH (ref 0–149)
VLDL CHOLESTEROL CAL: 44 mg/dL — AB (ref 5–40)

## 2017-06-09 ENCOUNTER — Telehealth: Payer: Self-pay | Admitting: Nurse Practitioner

## 2017-06-09 NOTE — Telephone Encounter (Signed)
Patient aware of labs.  

## 2017-06-12 ENCOUNTER — Telehealth: Payer: Self-pay | Admitting: Nurse Practitioner

## 2017-06-12 NOTE — Telephone Encounter (Signed)
Please advise on medication

## 2017-06-15 ENCOUNTER — Other Ambulatory Visit: Payer: Self-pay | Admitting: Nurse Practitioner

## 2017-06-15 MED ORDER — CLONIDINE HCL 0.3 MG PO TABS: 0.3000 mg | ORAL_TABLET | Freq: Two times a day (BID) | ORAL | 1 refills | Status: DC

## 2017-06-15 NOTE — Telephone Encounter (Signed)
Patient aware of recommendations and verbalizes understanding.  

## 2017-06-15 NOTE — Telephone Encounter (Signed)
Increase clonidine to 0.3mg  BID- rx sent to pharmacy- continue t okeep diary of blood pressures and let me know how doing.

## 2017-06-17 ENCOUNTER — Ambulatory Visit (INDEPENDENT_AMBULATORY_CARE_PROVIDER_SITE_OTHER): Payer: Medicare Other

## 2017-06-17 DIAGNOSIS — Z780 Asymptomatic menopausal state: Secondary | ICD-10-CM

## 2017-08-13 ENCOUNTER — Other Ambulatory Visit: Payer: Self-pay | Admitting: *Deleted

## 2017-08-13 MED ORDER — CLONIDINE HCL 0.3 MG PO TABS: 0.3000 mg | ORAL_TABLET | Freq: Two times a day (BID) | ORAL | 3 refills | Status: DC

## 2017-09-09 ENCOUNTER — Encounter: Payer: Self-pay | Admitting: Nurse Practitioner

## 2017-09-09 ENCOUNTER — Ambulatory Visit: Payer: Medicare Other | Admitting: Nurse Practitioner

## 2017-09-09 VITALS — BP 130/82 | HR 79 | Temp 97.3°F | Ht 62.0 in | Wt 175.0 lb

## 2017-09-09 DIAGNOSIS — Z1212 Encounter for screening for malignant neoplasm of rectum: Secondary | ICD-10-CM

## 2017-09-09 DIAGNOSIS — R739 Hyperglycemia, unspecified: Secondary | ICD-10-CM

## 2017-09-09 DIAGNOSIS — Z6832 Body mass index (BMI) 32.0-32.9, adult: Secondary | ICD-10-CM | POA: Diagnosis not present

## 2017-09-09 DIAGNOSIS — E785 Hyperlipidemia, unspecified: Secondary | ICD-10-CM

## 2017-09-09 DIAGNOSIS — I10 Essential (primary) hypertension: Secondary | ICD-10-CM | POA: Diagnosis not present

## 2017-09-09 DIAGNOSIS — Z23 Encounter for immunization: Secondary | ICD-10-CM

## 2017-09-09 MED ORDER — HYDROCHLOROTHIAZIDE 25 MG PO TABS: 25.0000 mg | ORAL_TABLET | Freq: Every day | ORAL | 1 refills | Status: DC

## 2017-09-09 MED ORDER — LISINOPRIL 40 MG PO TABS: 40.0000 mg | ORAL_TABLET | Freq: Every day | ORAL | 1 refills | Status: DC

## 2017-09-09 MED ORDER — METOPROLOL TARTRATE 100 MG PO TABS: 100.0000 mg | ORAL_TABLET | Freq: Two times a day (BID) | ORAL | 1 refills | Status: DC

## 2017-09-09 MED ORDER — SIMVASTATIN 40 MG PO TABS
40.0000 mg | ORAL_TABLET | Freq: Every day | ORAL | 1 refills | Status: DC
Start: 2017-09-09 — End: 2018-03-09

## 2017-09-09 MED ORDER — CLONIDINE HCL 0.3 MG PO TABS: 0.3000 mg | ORAL_TABLET | Freq: Two times a day (BID) | ORAL | 3 refills | Status: DC

## 2017-09-09 NOTE — Progress Notes (Signed)
Subjective:    Patient ID: [name], female    DOB: 05/12/1946, 72 y.o.   MRN: [id number]  HPI  Meryl Hubers is here today for follow up of chronic medical problem.  Outpatient Encounter Medications as of 09/09/2017  Medication Sig  . aspirin EC 81 MG tablet Take 1 tablet (81 mg total) by mouth daily.  . cloNIDine (CATAPRES) 0.3 MG tablet Take 1 tablet (0.3 mg total) by mouth 2 (two) times daily.  . hydrochlorothiazide (HYDRODIURIL) 25 MG tablet Take 1 tablet (25 mg total) by mouth daily.  Marland Kitchen lisinopril (PRINIVIL,ZESTRIL) 40 MG tablet Take 1 tablet (40 mg total) by mouth daily.  . metoprolol tartrate (LOPRESSOR) 100 MG tablet Take 1 tablet (100 mg total) by mouth 2 (two) times daily.  . simvastatin (ZOCOR) 40 MG tablet Take 1 tablet (40 mg total) by mouth at bedtime.     1. Essential hypertension  Does not check blood pressures at home. denies chest pain, sob or headache  2. Hyperlipidemia with target LDL less than 100  Does not really watch diet very well.  No exercise  3. BMI 32.0-32.9,adult  No recent weight chnages    New complaints: None today  Social history: retired    Review of Systems  Constitutional: Negative for activity change and appetite change.  HENT: Negative.   Eyes: Negative for pain.  Respiratory: Negative for shortness of breath.   Cardiovascular: Negative for chest pain, palpitations and leg swelling.  Gastrointestinal: Negative for abdominal pain.  Endocrine: Negative for polydipsia.  Genitourinary: Negative.   Skin: Negative for rash.  Neurological: Negative for dizziness, weakness and headaches.  Hematological: Does not bruise/bleed easily.  Psychiatric/Behavioral: Negative.   All other systems reviewed and are negative.      Objective:   Physical Exam  Constitutional: She is oriented to person, place, and time. She appears well-developed and well-nourished.  HENT:  Nose: Nose normal.  Mouth/Throat: Oropharynx is clear and  moist.  Eyes: EOM are normal.  Neck: Trachea normal, normal range of motion and full passive range of motion without pain. Neck supple. No JVD present. Carotid bruit is not present. No thyromegaly present.  Cardiovascular: Normal rate, regular rhythm, normal heart sounds and intact distal pulses. Exam reveals no gallop and no friction rub.  No murmur heard. Pulmonary/Chest: Effort normal and breath sounds normal.  Abdominal: Soft. Bowel sounds are normal. She exhibits no distension and no mass. There is no tenderness.  Musculoskeletal: Normal range of motion.  Lymphadenopathy:    She has no cervical adenopathy.  Neurological: She is alert and oriented to person, place, and time. She has normal reflexes.  Skin: Skin is warm and dry.  Psychiatric: She has a normal mood and affect. Her behavior is normal. Judgment and thought content normal.   BP 130/82 (BP Location: Left Arm, Cuff Size: Normal)   Pulse 79   Temp (!) 97.3 F (36.3 C) (Oral)   Ht '5\' 2"'  (1.575 m)   Wt 175 lb (79.4 kg)   BMI 32.01 kg/m        Assessment & Plan:  1. Essential hypertension Low sodium diet - lisinopril (PRINIVIL,ZESTRIL) 40 MG tablet; Take 1 tablet (40 mg total) by mouth daily.  Dispense: 90 tablet; Refill: 1 - hydrochlorothiazide (HYDRODIURIL) 25 MG tablet; Take 1 tablet (25 mg total) by mouth daily.  Dispense: 90 tablet; Refill: 1 - metoprolol tartrate (LOPRESSOR) 100 MG tablet; Take 1 tablet (100 mg total) by mouth 2 (two) times daily.  Dispense: 180 tablet; Refill: 1 - cloNIDine (CATAPRES) 0.3 MG tablet; Take 1 tablet (0.3 mg total) by mouth 2 (two) times daily.  Dispense: 60 tablet; Refill: 3 - CMP14+EGFR  2. Hyperlipidemia with target LDL less than 100 Low fat diet - simvastatin (ZOCOR) 40 MG tablet; Take 1 tablet (40 mg total) by mouth at bedtime.  Dispense: 90 tablet; Refill: 1 - Lipid panel  3. BMI 32.0-32.9,adult Discussed diet and exercise for person with BMI >25 Will recheck weight in 3-6  months     Labs pending Health maintenance reviewed Diet and exercise encouraged Continue all meds Follow up  In 6 month   [place], FNP

## 2017-09-09 NOTE — Addendum Note (Signed)
Addended by: Cleda DaubUCKER, AMANDA G on: 09/09/2017 04:21 PM   Modules accepted: Orders

## 2017-09-09 NOTE — Patient Instructions (Signed)
DASH Eating Plan DASH stands for "Dietary Approaches to Stop Hypertension." The DASH eating plan is a healthy eating plan that has been shown to reduce high blood pressure (hypertension). It may also reduce your risk for type 2 diabetes, heart disease, and stroke. The DASH eating plan may also help with weight loss. What are tips for following this plan? General guidelines  Avoid eating more than 2,300 mg (milligrams) of salt (sodium) a day. If you have hypertension, you may need to reduce your sodium intake to 1,500 mg a day.  Limit alcohol intake to no more than 1 drink a day for nonpregnant women and 2 drinks a day for men. One drink equals 12 oz of beer, 5 oz of wine, or 1 oz of hard liquor.  Work with your health care provider to maintain a healthy body weight or to lose weight. Ask what an ideal weight is for you.  Get at least 30 minutes of exercise that causes your heart to beat faster (aerobic exercise) most days of the week. Activities may include walking, swimming, or biking.  Work with your health care provider or diet and nutrition specialist (dietitian) to adjust your eating plan to your individual calorie needs. Reading food labels  Check food labels for the amount of sodium per serving. Choose foods with less than 5 percent of the Daily Value of sodium. Generally, foods with less than 300 mg of sodium per serving fit into this eating plan.  To find whole grains, look for the word "whole" as the first word in the ingredient list. Shopping  Buy products labeled as "low-sodium" or "no salt added."  Buy fresh foods. Avoid canned foods and premade or frozen meals. Cooking  Avoid adding salt when cooking. Use salt-free seasonings or herbs instead of table salt or sea salt. Check with your health care provider or pharmacist before using salt substitutes.  Do not fry foods. Cook foods using healthy methods such as baking, boiling, grilling, and broiling instead.  Cook with  heart-healthy oils, such as olive, canola, soybean, or sunflower oil. Meal planning   Eat a balanced diet that includes: ? 5 or more servings of fruits and vegetables each day. At each meal, try to fill half of your plate with fruits and vegetables. ? Up to 6-8 servings of whole grains each day. ? Less than 6 oz of lean meat, poultry, or fish each day. A 3-oz serving of meat is about the same size as a deck of cards. One egg equals 1 oz. ? 2 servings of low-fat dairy each day. ? A serving of nuts, seeds, or beans 5 times each week. ? Heart-healthy fats. Healthy fats called Omega-3 fatty acids are found in foods such as flaxseeds and coldwater fish, like sardines, salmon, and mackerel.  Limit how much you eat of the following: ? Canned or prepackaged foods. ? Food that is high in trans fat, such as fried foods. ? Food that is high in saturated fat, such as fatty meat. ? Sweets, desserts, sugary drinks, and other foods with added sugar. ? Full-fat dairy products.  Do not salt foods before eating.  Try to eat at least 2 vegetarian meals each week.  Eat more home-cooked food and less restaurant, buffet, and fast food.  When eating at a restaurant, ask that your food be prepared with less salt or no salt, if possible. What foods are recommended? The items listed may not be a complete list. Talk with your dietitian about what   dietary choices are best for you. Grains Whole-grain or whole-wheat bread. Whole-grain or whole-wheat pasta. Brown rice. Oatmeal. Quinoa. Bulgur. Whole-grain and low-sodium cereals. Pita bread. Low-fat, low-sodium crackers. Whole-wheat flour tortillas. Vegetables Fresh or frozen vegetables (raw, steamed, roasted, or grilled). Low-sodium or reduced-sodium tomato and vegetable juice. Low-sodium or reduced-sodium tomato sauce and tomato paste. Low-sodium or reduced-sodium canned vegetables. Fruits All fresh, dried, or frozen fruit. Canned fruit in natural juice (without  added sugar). Meat and other protein foods Skinless chicken or turkey. Ground chicken or turkey. Pork with fat trimmed off. Fish and seafood. Egg whites. Dried beans, peas, or lentils. Unsalted nuts, nut butters, and seeds. Unsalted canned beans. Lean cuts of beef with fat trimmed off. Low-sodium, lean deli meat. Dairy Low-fat (1%) or fat-free (skim) milk. Fat-free, low-fat, or reduced-fat cheeses. Nonfat, low-sodium ricotta or cottage cheese. Low-fat or nonfat yogurt. Low-fat, low-sodium cheese. Fats and oils Soft margarine without trans fats. Vegetable oil. Low-fat, reduced-fat, or light mayonnaise and salad dressings (reduced-sodium). Canola, safflower, olive, soybean, and sunflower oils. Avocado. Seasoning and other foods Herbs. Spices. Seasoning mixes without salt. Unsalted popcorn and pretzels. Fat-free sweets. What foods are not recommended? The items listed may not be a complete list. Talk with your dietitian about what dietary choices are best for you. Grains Baked goods made with fat, such as croissants, muffins, or some breads. Dry pasta or rice meal packs. Vegetables Creamed or fried vegetables. Vegetables in a cheese sauce. Regular canned vegetables (not low-sodium or reduced-sodium). Regular canned tomato sauce and paste (not low-sodium or reduced-sodium). Regular tomato and vegetable juice (not low-sodium or reduced-sodium). Pickles. Olives. Fruits Canned fruit in a light or heavy syrup. Fried fruit. Fruit in cream or butter sauce. Meat and other protein foods Fatty cuts of meat. Ribs. Fried meat. Bacon. Sausage. Bologna and other processed lunch meats. Salami. Fatback. Hotdogs. Bratwurst. Salted nuts and seeds. Canned beans with added salt. Canned or smoked fish. Whole eggs or egg yolks. Chicken or turkey with skin. Dairy Whole or 2% milk, cream, and half-and-half. Whole or full-fat cream cheese. Whole-fat or sweetened yogurt. Full-fat cheese. Nondairy creamers. Whipped toppings.  Processed cheese and cheese spreads. Fats and oils Butter. Stick margarine. Lard. Shortening. Ghee. Bacon fat. Tropical oils, such as coconut, palm kernel, or palm oil. Seasoning and other foods Salted popcorn and pretzels. Onion salt, garlic salt, seasoned salt, table salt, and sea salt. Worcestershire sauce. Tartar sauce. Barbecue sauce. Teriyaki sauce. Soy sauce, including reduced-sodium. Steak sauce. Canned and packaged gravies. Fish sauce. Oyster sauce. Cocktail sauce. Horseradish that you find on the shelf. Ketchup. Mustard. Meat flavorings and tenderizers. Bouillon cubes. Hot sauce and Tabasco sauce. Premade or packaged marinades. Premade or packaged taco seasonings. Relishes. Regular salad dressings. Where to find more information:  National Heart, Lung, and Blood Institute: www.nhlbi.nih.gov  American Heart Association: www.heart.org Summary  The DASH eating plan is a healthy eating plan that has been shown to reduce high blood pressure (hypertension). It may also reduce your risk for type 2 diabetes, heart disease, and stroke.  With the DASH eating plan, you should limit salt (sodium) intake to 2,300 mg a day. If you have hypertension, you may need to reduce your sodium intake to 1,500 mg a day.  When on the DASH eating plan, aim to eat more fresh fruits and vegetables, whole grains, lean proteins, low-fat dairy, and heart-healthy fats.  Work with your health care provider or diet and nutrition specialist (dietitian) to adjust your eating plan to your individual   calorie needs. This information is not intended to replace advice given to you by your health care provider. Make sure you discuss any questions you have with your health care provider. Document Released: 07/17/2011 Document Revised: 07/21/2016 Document Reviewed: 07/21/2016 Elsevier Interactive Patient Education  2018 Elsevier Inc.  

## 2017-09-09 NOTE — Addendum Note (Signed)
Addended by: Prescott GumLAND, Donnamaria Shands M on: 09/09/2017 04:58 PM   Modules accepted: Orders

## 2017-09-10 LAB — CMP14+EGFR
A/G RATIO: 1.7 (ref 1.2–2.2)
ALBUMIN: 4.5 g/dL (ref 3.5–4.8)
ALT: 18 IU/L (ref 0–32)
AST: 15 IU/L (ref 0–40)
Alkaline Phosphatase: 85 IU/L (ref 39–117)
BILIRUBIN TOTAL: 1.3 mg/dL — AB (ref 0.0–1.2)
BUN / CREAT RATIO: 21 (ref 12–28)
BUN: 15 mg/dL (ref 8–27)
CHLORIDE: 97 mmol/L (ref 96–106)
CO2: 25 mmol/L (ref 20–29)
Calcium: 9.9 mg/dL (ref 8.7–10.3)
Creatinine, Ser: 0.7 mg/dL (ref 0.57–1.00)
GFR calc Af Amer: 102 mL/min/{1.73_m2} (ref >59)
GFR calc non Af Amer: 88 mL/min/{1.73_m2} (ref >59)
GLUCOSE: 117 mg/dL — AB (ref 65–99)
Globulin, Total: 2.6 g/dL (ref 1.5–4.5)
POTASSIUM: 4.4 mmol/L (ref 3.5–5.2)
Sodium: 138 mmol/L (ref 134–144)
Total Protein: 7.1 g/dL (ref 6.0–8.5)

## 2017-09-10 LAB — LIPID PANEL
CHOLESTEROL TOTAL: 166 mg/dL (ref 100–199)
Chol/HDL Ratio: 2.8 ratio (ref 0.0–4.4)
HDL: 59 mg/dL (ref >39)
LDL Calculated: 83 mg/dL (ref 0–99)
Triglycerides: 121 mg/dL (ref 0–149)
VLDL CHOLESTEROL CAL: 24 mg/dL (ref 5–40)

## 2017-09-10 LAB — FECAL OCCULT BLOOD, IMMUNOCHEMICAL: FECAL OCCULT BLD: NEGATIVE

## 2017-09-14 ENCOUNTER — Other Ambulatory Visit: Payer: Self-pay | Admitting: Nurse Practitioner

## 2017-09-14 DIAGNOSIS — R739 Hyperglycemia, unspecified: Secondary | ICD-10-CM

## 2017-09-14 LAB — BAYER DCA HB A1C WAIVED: HB A1C (BAYER DCA - WAIVED): 5.9 % (ref <7.0)

## 2017-09-14 NOTE — Addendum Note (Signed)
Addended by: Margorie JohnJOHNSON, Codee Bloodworth M on: 09/14/2017 02:06 PM   Modules accepted: Orders

## 2017-11-02 ENCOUNTER — Other Ambulatory Visit: Payer: Self-pay | Admitting: Nurse Practitioner

## 2017-11-02 DIAGNOSIS — I10 Essential (primary) hypertension: Secondary | ICD-10-CM

## 2018-01-09 ENCOUNTER — Encounter (HOSPITAL_COMMUNITY): Payer: Self-pay | Admitting: Emergency Medicine

## 2018-01-09 ENCOUNTER — Emergency Department (HOSPITAL_COMMUNITY)
Admission: EM | Admit: 2018-01-09 | Discharge: 2018-01-09 | Disposition: A | Discharge status: Home / Self Care | Payer: Medicare Other | Attending: Emergency Medicine | Admitting: Emergency Medicine

## 2018-01-09 ENCOUNTER — Other Ambulatory Visit: Payer: Self-pay

## 2018-01-09 DIAGNOSIS — S61412A Laceration without foreign body of left hand, initial encounter: Secondary | ICD-10-CM | POA: Diagnosis present

## 2018-01-09 DIAGNOSIS — Z79899 Other long term (current) drug therapy: Secondary | ICD-10-CM | POA: Diagnosis not present

## 2018-01-09 DIAGNOSIS — Y929 Unspecified place or not applicable: Secondary | ICD-10-CM | POA: Diagnosis not present

## 2018-01-09 DIAGNOSIS — W268XXA Contact with other sharp object(s), not elsewhere classified, initial encounter: Secondary | ICD-10-CM | POA: Diagnosis not present

## 2018-01-09 DIAGNOSIS — I10 Essential (primary) hypertension: Secondary | ICD-10-CM | POA: Insufficient documentation

## 2018-01-09 DIAGNOSIS — Y999 Unspecified external cause status: Secondary | ICD-10-CM | POA: Diagnosis not present

## 2018-01-09 DIAGNOSIS — Z7982 Long term (current) use of aspirin: Secondary | ICD-10-CM | POA: Insufficient documentation

## 2018-01-09 DIAGNOSIS — Y9389 Activity, other specified: Secondary | ICD-10-CM | POA: Insufficient documentation

## 2018-01-09 NOTE — ED Provider Notes (Signed)
[facility] Provider Note   CSN: [id number] Arrival date & time: 01/09/18  1631     History   Chief Complaint Chief Complaint  Patient presents with  . Laceration    HPI Deanna FullerSaundra Tolley is a 72 y.o. female.  Chief complaint is left hand laceration  HPI 72 year old female.  Sitting addition on the counter when it fractured and lacerated her left hand palm.  2 similar laceration.  Mild continued bleeding.  Past Medical History:  Diagnosis Date  . History of pneumonia   . Hyperlipidemia   . Hypertension     Patient Active Problem List   Diagnosis Date Noted  . Rhinitis, allergic 01/17/2016  . BMI 32.0-32.9,adult 01/17/2016  . Hyperlipidemia with target LDL less than 100 05/30/2013  . Hypertension 12/02/2012    Past Surgical History:  Procedure Laterality Date  . gangloin cyst       OB History   None      Home Medications    Prior to Admission medications   Medication Sig Start Date End Date Taking? Authorizing Provider  aspirin EC 81 MG tablet Take 1 tablet (81 mg total) by mouth daily. 12/12/13   Daphine DeutscherMartin, Mary-Margaret, FNP  cloNIDine (CATAPRES) 0.3 MG tablet TAKE 1 TABLET BY MOUTH TWICE DAILY 11/02/17   Daphine DeutscherMartin, Mary-Margaret, FNP  hydrochlorothiazide (HYDRODIURIL) 25 MG tablet Take 1 tablet (25 mg total) by mouth daily. 09/09/17   Daphine DeutscherMartin, Mary-Margaret, FNP  lisinopril (PRINIVIL,ZESTRIL) 40 MG tablet Take 1 tablet (40 mg total) by mouth daily. 09/09/17   Daphine DeutscherMartin, Mary-Margaret, FNP  metoprolol tartrate (LOPRESSOR) 100 MG tablet Take 1 tablet (100 mg total) by mouth 2 (two) times daily. 09/09/17   Daphine DeutscherMartin, Mary-Margaret, FNP  simvastatin (ZOCOR) 40 MG tablet Take 1 tablet (40 mg total) by mouth at bedtime. 09/09/17   Bennie PieriniMartin, Mary-Margaret, FNP    Family History Family History  Problem Relation Age of Onset  . Asthma Mother   . Heart disease Father     Social History Social History   Tobacco Use  . Smoking status: Never Smoker  . Smokeless  tobacco: Never Used  Substance Use Topics  . Alcohol use: Yes    Comment: occasional  . Drug use: No     Allergies   Amlodipine and Sulfa antibiotics   Review of Systems Review of Systems  Skin: Positive for wound.       Left hand palmar laceration.  No additional injuries.  Complete review of systems otherwise negative.   Physical Exam Updated Vital Signs BP (!) 215/100 (BP Location: Right Arm)   Pulse 96   Temp 98.6 F (37 C) (Oral)   Resp 14   Ht 5\' 4"  (1.626 m)   Wt 78.9 kg (174 lb)   SpO2 97%   BMI 29.87 kg/m   Physical Exam  Skin:  2 similar transverse laceration to the proximal left palm.  Normal tendon function to the digits.  Normal sensation and cap refill distally.  Wound is partially full-thickness.     ED Treatments / Results  Labs (all labs ordered are listed, but only abnormal results are displayed) Labs Reviewed - No data to display  EKG None  Radiology No results found.  Procedures Procedures (including critical care time)  Medications Ordered in ED Medications - No data to display   Initial Impression / Assessment and Plan / ED Course  I have reviewed the triage vital signs and the nursing notes.  Pertinent labs & imaging results that  were available during my care of the patient were reviewed by me and considered in my medical decision making (see chart for details).     LACERATION REPAIR Performed by: Claudean Kinds Authorized by: Claudean Kinds Consent: Verbal consent obtained. Risks and benefits: risks, benefits and alternatives were discussed Consent given by: patient Patient identity confirmed: provided demographic data Prepped and Draped in normal sterile fashion Wound explored  Laceration Location: Lt palm  Laceration Length: 2cm  No Foreign Bodies seen or palpated  Anesthesia: local infiltration  Local anesthetic: lidocaine 1% s epinephrine  Anesthetic total: 2 ml  Irrigation method: syringe Amount  of cleaning: standard  Skin closure: yes  Number of sutures: 4x 4-0 nylon  Technique: simple  Patient tolerance: Patient tolerated the procedure well with no immediate complications.   Final Clinical Impressions(s) / ED Diagnoses   Final diagnoses:  Laceration of left hand without foreign body, initial encounter    ED Discharge Orders    None       Rolland Porter, MD 01/09/18 1713

## 2018-01-09 NOTE — Discharge Instructions (Addendum)
Removal in 7 to 10 days with primary care physician. Wash with soap and water, apply an antibiotic ointment and dressing each day.

## 2018-01-09 NOTE — ED Triage Notes (Signed)
Patient states she cut her left hand on a ceramic bowl. Bleeding controlled at triage.

## 2018-01-18 ENCOUNTER — Encounter: Payer: Self-pay | Admitting: Physician Assistant

## 2018-01-18 ENCOUNTER — Ambulatory Visit: Payer: Medicare Other | Admitting: Physician Assistant

## 2018-01-18 VITALS — BP 140/100 | HR 71 | Temp 97.0°F | Ht 64.0 in | Wt 173.0 lb

## 2018-01-18 DIAGNOSIS — S61412D Laceration without foreign body of left hand, subsequent encounter: Secondary | ICD-10-CM

## 2018-01-18 NOTE — Progress Notes (Signed)
BP (!) 184/90   Pulse 71   Temp (!) 97 F (36.1 C) (Oral)   Ht 5\' 4"  (1.626 m)   Wt 173 lb (78.5 kg)   BMI 29.70 kg/m    Subjective:    Patient ID: Deanna Becker, female    DOB: 03-23-1946, 72 y.o.   MRN: [id number]  HPI: Deanna Becker is a 72 y.o. female presenting on 01/18/2018 for No chief complaint on file.  Patient comes in for suture removal for a laceration she got about 9 days ago.  It was in the palm of her left hand.  Sutures were placed.  She is up-to-date on her tetanus.  She has had no problems from the sutures.  No signs of infection.  Past Medical History:  Diagnosis Date  . History of pneumonia   . Hyperlipidemia   . Hypertension    Relevant past medical, surgical, family and social history reviewed and updated as indicated. Interim medical history since our last visit reviewed. Allergies and medications reviewed and updated. DATA REVIEWED: CHART IN EPIC  Family History reviewed for pertinent findings.  Review of Systems  Constitutional: Negative.  Negative for activity change, fatigue and fever.  HENT: Negative.   Eyes: Negative.   Respiratory: Negative.  Negative for cough.   Cardiovascular: Negative.  Negative for chest pain.  Gastrointestinal: Negative.  Negative for abdominal pain.  Endocrine: Negative.   Genitourinary: Negative.  Negative for dysuria.  Musculoskeletal: Negative.   Skin: Negative.   Neurological: Negative.     Allergies as of 01/18/2018      Reactions   Amlodipine Swelling   Sulfa Antibiotics       Medication List        Accurate as of 01/18/18  8:46 AM. Always use your most recent med list.          aspirin EC 81 MG tablet Take 1 tablet (81 mg total) by mouth daily.   cloNIDine 0.3 MG tablet Commonly known as:  CATAPRES TAKE 1 TABLET BY MOUTH TWICE DAILY   hydrochlorothiazide 25 MG tablet Commonly known as:  HYDRODIURIL Take 1 tablet (25 mg total) by mouth daily.   lisinopril 40 MG tablet Commonly known as:   PRINIVIL,ZESTRIL Take 1 tablet (40 mg total) by mouth daily.   metoprolol tartrate 100 MG tablet Commonly known as:  LOPRESSOR Take 1 tablet (100 mg total) by mouth 2 (two) times daily.   simvastatin 40 MG tablet Commonly known as:  ZOCOR Take 1 tablet (40 mg total) by mouth at bedtime.          Objective:    BP (!) 184/90   Pulse 71   Temp (!) 97 F (36.1 C) (Oral)   Ht 5\' 4"  (1.626 m)   Wt 173 lb (78.5 kg)   BMI 29.70 kg/m   Allergies  Allergen Reactions  . Amlodipine Swelling  . Sulfa Antibiotics     Wt Readings from Last 3 Encounters:  01/18/18 173 lb (78.5 kg)  01/09/18 174 lb (78.9 kg)  09/09/17 175 lb (79.4 kg)    Physical Exam  Constitutional: She is oriented to person, place, and time. She appears well-developed and well-nourished.  HENT:  Head: Normocephalic and atraumatic.  Eyes: Pupils are equal, round, and reactive to light. Conjunctivae and EOM are normal.  Cardiovascular: Normal rate, regular rhythm, normal heart sounds and intact distal pulses.  Pulmonary/Chest: Effort normal and breath sounds normal.  Abdominal: Soft. Bowel sounds are normal.  Neurological: She  is alert and oriented to person, place, and time. She has normal reflexes.  Skin: Skin is warm and dry. No rash noted.  Healing wound in the palm of her left hand for simple interrupted sutures are present and removed.  She has very good closure to continue wound care and gentle massaging to break down the scar.  Psychiatric: She has a normal mood and affect. Her behavior is normal. Judgment and thought content normal.        Assessment & Plan:   There are no diagnoses linked to this encounter.  Continue all other maintenance medications as listed above.  Follow up plan: No follow-ups on file.  Educational handout given for survey  Remus Loffler PA-C [facility] [address]  [place], [place] [address] [phone]   01/18/2018, 8:46 AM

## 2018-03-09 ENCOUNTER — Ambulatory Visit: Payer: Medicare Other | Admitting: Nurse Practitioner

## 2018-03-09 ENCOUNTER — Encounter: Payer: Self-pay | Admitting: Nurse Practitioner

## 2018-03-09 VITALS — BP 136/88 | HR 66 | Temp 97.3°F | Ht 64.0 in | Wt 170.1 lb

## 2018-03-09 DIAGNOSIS — E785 Hyperlipidemia, unspecified: Secondary | ICD-10-CM

## 2018-03-09 DIAGNOSIS — I10 Essential (primary) hypertension: Secondary | ICD-10-CM | POA: Diagnosis not present

## 2018-03-09 DIAGNOSIS — Z6832 Body mass index (BMI) 32.0-32.9, adult: Secondary | ICD-10-CM

## 2018-03-09 LAB — CMP14+EGFR
ALBUMIN: 4.5 g/dL (ref 3.5–4.8)
ALT: 13 IU/L (ref 0–32)
AST: 13 IU/L (ref 0–40)
Albumin/Globulin Ratio: 1.7 (ref 1.2–2.2)
Alkaline Phosphatase: 76 IU/L (ref 39–117)
BUN/Creatinine Ratio: 22 (ref 12–28)
BUN: 16 mg/dL (ref 8–27)
Bilirubin Total: 1.3 mg/dL — ABNORMAL HIGH (ref 0.0–1.2)
CO2: 24 mmol/L (ref 20–29)
CREATININE: 0.73 mg/dL (ref 0.57–1.00)
Calcium: 9.8 mg/dL (ref 8.7–10.3)
Chloride: 97 mmol/L (ref 96–106)
GFR calc Af Amer: 96 mL/min/{1.73_m2} (ref >59)
GFR, EST NON AFRICAN AMERICAN: 84 mL/min/{1.73_m2} (ref >59)
GLOBULIN, TOTAL: 2.7 g/dL (ref 1.5–4.5)
GLUCOSE: 116 mg/dL — AB (ref 65–99)
Potassium: 4.3 mmol/L (ref 3.5–5.2)
SODIUM: 138 mmol/L (ref 134–144)
TOTAL PROTEIN: 7.2 g/dL (ref 6.0–8.5)

## 2018-03-09 LAB — LIPID PANEL
CHOL/HDL RATIO: 2.8 ratio (ref 0.0–4.4)
Cholesterol, Total: 164 mg/dL (ref 100–199)
HDL: 58 mg/dL (ref >39)
LDL CALC: 83 mg/dL (ref 0–99)
TRIGLYCERIDES: 114 mg/dL (ref 0–149)
VLDL Cholesterol Cal: 23 mg/dL (ref 5–40)

## 2018-03-09 MED ORDER — METOPROLOL TARTRATE 100 MG PO TABS: 100.0000 mg | ORAL_TABLET | Freq: Two times a day (BID) | ORAL | 1 refills | Status: DC

## 2018-03-09 MED ORDER — SIMVASTATIN 40 MG PO TABS: 40.0000 mg | ORAL_TABLET | Freq: Every day | ORAL | 1 refills | Status: DC

## 2018-03-09 MED ORDER — HYDROCHLOROTHIAZIDE 25 MG PO TABS: 25.0000 mg | ORAL_TABLET | Freq: Every day | ORAL | 1 refills | Status: DC

## 2018-03-09 MED ORDER — LISINOPRIL 40 MG PO TABS: 40.0000 mg | ORAL_TABLET | Freq: Every day | ORAL | 1 refills | Status: DC

## 2018-03-09 MED ORDER — CLONIDINE HCL 0.3 MG PO TABS: 0.3000 mg | ORAL_TABLET | Freq: Two times a day (BID) | ORAL | 1 refills | Status: DC

## 2018-03-09 NOTE — Progress Notes (Signed)
Subjective:    Patient ID: Deanna Becker, female    DOB: [birth year], 72 y.o.   MRN: [id number]   Chief Complaint: Medical Management of chronic issues  HPI:  1. Essential hypertension  No c/o chest pain, sob or headache. Does check blood pressure at home and usually runs in 025'E systolic.Patient is usually very anxious when she comes to the doctor. BP Readings from Last 3 Encounters:  03/09/18 (!) 186/90  01/18/18 (!) 140/100  01/09/18 (!) 215/100     2. Hyperlipidemia with target LDL less than 100  Takes zocor daily. Does not watch diet very closely and does no dedicated exercise.  3. BMI 32.0-32.9,adult  Weight down 2.8lbs.    Outpatient Encounter Medications as of 03/09/2018  Medication Sig  . aspirin EC 81 MG tablet Take 1 tablet (81 mg total) by mouth daily.  . cloNIDine (CATAPRES) 0.3 MG tablet TAKE 1 TABLET BY MOUTH TWICE DAILY  . hydrochlorothiazide (HYDRODIURIL) 25 MG tablet Take 1 tablet (25 mg total) by mouth daily.  Marland Kitchen lisinopril (PRINIVIL,ZESTRIL) 40 MG tablet Take 1 tablet (40 mg total) by mouth daily.  . metoprolol tartrate (LOPRESSOR) 100 MG tablet Take 1 tablet (100 mg total) by mouth 2 (two) times daily.  . simvastatin (ZOCOR) 40 MG tablet Take 1 tablet (40 mg total) by mouth at bedtime.     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    Review of Systems  Constitutional: Negative for activity change and appetite change.  HENT: Negative.   Eyes: Negative for pain.  Respiratory: Negative for shortness of breath.   Cardiovascular: Negative for chest pain, palpitations and leg swelling.  Gastrointestinal: Negative for abdominal pain.  Endocrine: Negative for polydipsia.  Genitourinary: Negative.   Skin: Negative for rash.  Neurological: Negative for dizziness, weakness and headaches.  Hematological: Does not bruise/bleed easily.  Psychiatric/Behavioral: Negative.   All other systems reviewed and are  negative.      Objective:   Physical Exam  Constitutional: She is oriented to person, place, and time. She appears well-developed and well-nourished.  HENT:  Head: Normocephalic.  Nose: Nose normal.  Mouth/Throat: Oropharynx is clear and moist.  Eyes: Pupils are equal, round, and reactive to light. EOM are normal.  Neck: Normal range of motion. Neck supple. No JVD present. Carotid bruit is not present.  Cardiovascular: Normal rate, regular rhythm, normal heart sounds and intact distal pulses.  Pulmonary/Chest: Effort normal and breath sounds normal. No respiratory distress. She has no wheezes. She has no rales. She exhibits no tenderness.  Abdominal: Soft. Normal appearance, normal aorta and bowel sounds are normal. She exhibits no distension, no abdominal bruit, no pulsatile midline mass and no mass. There is no splenomegaly or hepatomegaly. There is no tenderness.  Musculoskeletal: Normal range of motion. She exhibits no edema.  Lymphadenopathy:    She has no cervical adenopathy.  Neurological: She is alert and oriented to person, place, and time. She has normal reflexes.  Skin: Skin is warm and dry.  Psychiatric: She has a normal mood and affect. Her behavior is normal. Judgment and thought content normal.   BP 136/88 (BP Location: Left Arm, Cuff Size: Normal)   Pulse 66   Temp (!) 97.3 F (36.3 C) (Oral)   Ht '5\' 4"'  (1.626 m)   Wt 170 lb 2 oz (77.2 kg)   BMI 29.20 kg/m        Assessment & Plan:  Deanna Becker comes in  today with chief complaint of Medical Management of Chronic Issues   Diagnosis and orders addressed:  1. Essential hypertension Low sodium diet Keep diary of blood pressures - cloNIDine (CATAPRES) 0.3 MG tablet; Take 1 tablet (0.3 mg total) by mouth 2 (two) times daily.  Dispense: 180 tablet; Refill: 1 - lisinopril (PRINIVIL,ZESTRIL) 40 MG tablet; Take 1 tablet (40 mg total) by mouth daily.  Dispense: 90 tablet; Refill: 1 - hydrochlorothiazide  (HYDRODIURIL) 25 MG tablet; Take 1 tablet (25 mg total) by mouth daily.  Dispense: 90 tablet; Refill: 1 - metoprolol tartrate (LOPRESSOR) 100 MG tablet; Take 1 tablet (100 mg total) by mouth 2 (two) times daily.  Dispense: 180 tablet; Refill: 1 - CMP14+EGFR  2. Hyperlipidemia with target LDL less than 100 Low fat diet - simvastatin (ZOCOR) 40 MG tablet; Take 1 tablet (40 mg total) by mouth at bedtime.  Dispense: 90 tablet; Refill: 1 - Lipid panel  3. BMI 32.0-32.9,adult Discussed diet and exercise for person with BMI >25 Will recheck weight in 3-6 months    Labs pending Health Maintenance reviewed Diet and exercise encouraged  Follow up plan: 3 months   Mary-Margaret Hassell Done, FNP

## 2018-03-09 NOTE — Patient Instructions (Signed)
DASH Eating Plan DASH stands for "Dietary Approaches to Stop Hypertension." The DASH eating plan is a healthy eating plan that has been shown to reduce high blood pressure (hypertension). It may also reduce your risk for type 2 diabetes, heart disease, and stroke. The DASH eating plan may also help with weight loss. What are tips for following this plan? General guidelines  Avoid eating more than 2,300 mg (milligrams) of salt (sodium) a day. If you have hypertension, you may need to reduce your sodium intake to 1,500 mg a day.  Limit alcohol intake to no more than 1 drink a day for nonpregnant women and 2 drinks a day for men. One drink equals 12 oz of beer, 5 oz of wine, or 1 oz of hard liquor.  Work with your health care provider to maintain a healthy body weight or to lose weight. Ask what an ideal weight is for you.  Get at least 30 minutes of exercise that causes your heart to beat faster (aerobic exercise) most days of the week. Activities may include walking, swimming, or biking.  Work with your health care provider or diet and nutrition specialist (dietitian) to adjust your eating plan to your individual calorie needs. Reading food labels  Check food labels for the amount of sodium per serving. Choose foods with less than 5 percent of the Daily Value of sodium. Generally, foods with less than 300 mg of sodium per serving fit into this eating plan.  To find whole grains, look for the word "whole" as the first word in the ingredient list. Shopping  Buy products labeled as "low-sodium" or "no salt added."  Buy fresh foods. Avoid canned foods and premade or frozen meals. Cooking  Avoid adding salt when cooking. Use salt-free seasonings or herbs instead of table salt or sea salt. Check with your health care provider or pharmacist before using salt substitutes.  Do not fry foods. Cook foods using healthy methods such as baking, boiling, grilling, and broiling instead.  Cook with  heart-healthy oils, such as olive, canola, soybean, or sunflower oil. Meal planning   Eat a balanced diet that includes: ? 5 or more servings of fruits and vegetables each day. At each meal, try to fill half of your plate with fruits and vegetables. ? Up to 6-8 servings of whole grains each day. ? Less than 6 oz of lean meat, poultry, or fish each day. A 3-oz serving of meat is about the same size as a deck of cards. One egg equals 1 oz. ? 2 servings of low-fat dairy each day. ? A serving of nuts, seeds, or beans 5 times each week. ? Heart-healthy fats. Healthy fats called Omega-3 fatty acids are found in foods such as flaxseeds and coldwater fish, like sardines, salmon, and mackerel.  Limit how much you eat of the following: ? Canned or prepackaged foods. ? Food that is high in trans fat, such as fried foods. ? Food that is high in saturated fat, such as fatty meat. ? Sweets, desserts, sugary drinks, and other foods with added sugar. ? Full-fat dairy products.  Do not salt foods before eating.  Try to eat at least 2 vegetarian meals each week.  Eat more home-cooked food and less restaurant, buffet, and fast food.  When eating at a restaurant, ask that your food be prepared with less salt or no salt, if possible. What foods are recommended? The items listed may not be a complete list. Talk with your dietitian about what   dietary choices are best for you. Grains Whole-grain or whole-wheat bread. Whole-grain or whole-wheat pasta. Brown rice. Oatmeal. Quinoa. Bulgur. Whole-grain and low-sodium cereals. Pita bread. Low-fat, low-sodium crackers. Whole-wheat flour tortillas. Vegetables Fresh or frozen vegetables (raw, steamed, roasted, or grilled). Low-sodium or reduced-sodium tomato and vegetable juice. Low-sodium or reduced-sodium tomato sauce and tomato paste. Low-sodium or reduced-sodium canned vegetables. Fruits All fresh, dried, or frozen fruit. Canned fruit in natural juice (without  added sugar). Meat and other protein foods Skinless chicken or turkey. Ground chicken or turkey. Pork with fat trimmed off. Fish and seafood. Egg whites. Dried beans, peas, or lentils. Unsalted nuts, nut butters, and seeds. Unsalted canned beans. Lean cuts of beef with fat trimmed off. Low-sodium, lean deli meat. Dairy Low-fat (1%) or fat-free (skim) milk. Fat-free, low-fat, or reduced-fat cheeses. Nonfat, low-sodium ricotta or cottage cheese. Low-fat or nonfat yogurt. Low-fat, low-sodium cheese. Fats and oils Soft margarine without trans fats. Vegetable oil. Low-fat, reduced-fat, or light mayonnaise and salad dressings (reduced-sodium). Canola, safflower, olive, soybean, and sunflower oils. Avocado. Seasoning and other foods Herbs. Spices. Seasoning mixes without salt. Unsalted popcorn and pretzels. Fat-free sweets. What foods are not recommended? The items listed may not be a complete list. Talk with your dietitian about what dietary choices are best for you. Grains Baked goods made with fat, such as croissants, muffins, or some breads. Dry pasta or rice meal packs. Vegetables Creamed or fried vegetables. Vegetables in a cheese sauce. Regular canned vegetables (not low-sodium or reduced-sodium). Regular canned tomato sauce and paste (not low-sodium or reduced-sodium). Regular tomato and vegetable juice (not low-sodium or reduced-sodium). Pickles. Olives. Fruits Canned fruit in a light or heavy syrup. Fried fruit. Fruit in cream or butter sauce. Meat and other protein foods Fatty cuts of meat. Ribs. Fried meat. Bacon. Sausage. Bologna and other processed lunch meats. Salami. Fatback. Hotdogs. Bratwurst. Salted nuts and seeds. Canned beans with added salt. Canned or smoked fish. Whole eggs or egg yolks. Chicken or turkey with skin. Dairy Whole or 2% milk, cream, and half-and-half. Whole or full-fat cream cheese. Whole-fat or sweetened yogurt. Full-fat cheese. Nondairy creamers. Whipped toppings.  Processed cheese and cheese spreads. Fats and oils Butter. Stick margarine. Lard. Shortening. Ghee. Bacon fat. Tropical oils, such as coconut, palm kernel, or palm oil. Seasoning and other foods Salted popcorn and pretzels. Onion salt, garlic salt, seasoned salt, table salt, and sea salt. Worcestershire sauce. Tartar sauce. Barbecue sauce. Teriyaki sauce. Soy sauce, including reduced-sodium. Steak sauce. Canned and packaged gravies. Fish sauce. Oyster sauce. Cocktail sauce. Horseradish that you find on the shelf. Ketchup. Mustard. Meat flavorings and tenderizers. Bouillon cubes. Hot sauce and Tabasco sauce. Premade or packaged marinades. Premade or packaged taco seasonings. Relishes. Regular salad dressings. Where to find more information:  National Heart, Lung, and Blood Institute: www.nhlbi.nih.gov  American Heart Association: www.heart.org Summary  The DASH eating plan is a healthy eating plan that has been shown to reduce high blood pressure (hypertension). It may also reduce your risk for type 2 diabetes, heart disease, and stroke.  With the DASH eating plan, you should limit salt (sodium) intake to 2,300 mg a day. If you have hypertension, you may need to reduce your sodium intake to 1,500 mg a day.  When on the DASH eating plan, aim to eat more fresh fruits and vegetables, whole grains, lean proteins, low-fat dairy, and heart-healthy fats.  Work with your health care provider or diet and nutrition specialist (dietitian) to adjust your eating plan to your individual   calorie needs. This information is not intended to replace advice given to you by your health care provider. Make sure you discuss any questions you have with your health care provider. Document Released: 07/17/2011 Document Revised: 07/21/2016 Document Reviewed: 07/21/2016 Elsevier Interactive Patient Education  2018 Elsevier Inc.  

## 2018-04-03 ENCOUNTER — Other Ambulatory Visit: Payer: Self-pay | Admitting: Nurse Practitioner

## 2018-04-03 DIAGNOSIS — I10 Essential (primary) hypertension: Secondary | ICD-10-CM

## 2018-06-11 ENCOUNTER — Ambulatory Visit: Payer: Medicare Other | Admitting: Nurse Practitioner

## 2018-06-24 ENCOUNTER — Encounter: Payer: Self-pay | Admitting: Nurse Practitioner

## 2018-06-24 ENCOUNTER — Ambulatory Visit: Payer: Medicare Other | Admitting: Nurse Practitioner

## 2018-06-24 VITALS — BP 158/90 | HR 77 | Temp 97.8°F | Ht 64.0 in | Wt 168.0 lb

## 2018-06-24 DIAGNOSIS — E785 Hyperlipidemia, unspecified: Secondary | ICD-10-CM | POA: Diagnosis not present

## 2018-06-24 DIAGNOSIS — Z6832 Body mass index (BMI) 32.0-32.9, adult: Secondary | ICD-10-CM | POA: Diagnosis not present

## 2018-06-24 DIAGNOSIS — R739 Hyperglycemia, unspecified: Secondary | ICD-10-CM

## 2018-06-24 DIAGNOSIS — I10 Essential (primary) hypertension: Secondary | ICD-10-CM | POA: Diagnosis not present

## 2018-06-24 DIAGNOSIS — Z23 Encounter for immunization: Secondary | ICD-10-CM | POA: Diagnosis not present

## 2018-06-24 NOTE — Progress Notes (Signed)
Subjective:    Patient ID: Deanna Becker, female    DOB: 11/29/1945, 72 y.o.   MRN: [id number]   Chief Complaint: medical management of chronic issues  HPI:  1. Essential hypertension  No c/o chest pain, sob or headache. She checks blood pressure at home amnd ususally around 140 systolic.Marland Kitchen. Blood pressure is always high when she comes in because sheis nervous.  2. Hyperlipidemia with target LDL less than 100  Does not watch diet and does very little exercise.  3. BMI 32.0-32.9,adult  No weight changes    Outpatient Encounter Medications as of 06/24/2018  Medication Sig  . aspirin EC 81 MG tablet Take 1 tablet (81 mg total) by mouth daily.  . cloNIDine (CATAPRES) 0.3 MG tablet Take 1 tablet (0.3 mg total) by mouth 2 (two) times daily.  . hydrochlorothiazide (HYDRODIURIL) 25 MG tablet TAKE 1 TABLET BY MOUTH ONCE DAILY  . lisinopril (PRINIVIL,ZESTRIL) 40 MG tablet Take 1 tablet (40 mg total) by mouth daily.  . metoprolol tartrate (LOPRESSOR) 100 MG tablet TAKE 1 TABLET BY MOUTH TWICE DAILY  . simvastatin (ZOCOR) 40 MG tablet Take 1 tablet (40 mg total) by mouth at bedtime.      New complaints: None today  Social history: Is currently retired  Review of Systems  Constitutional: Negative for activity change and appetite change.  HENT: Negative.   Eyes: Negative for pain.  Respiratory: Negative for shortness of breath.   Cardiovascular: Negative for chest pain, palpitations and leg swelling.  Gastrointestinal: Negative for abdominal pain.  Endocrine: Negative for polydipsia.  Genitourinary: Negative.   Skin: Negative for rash.  Neurological: Negative for dizziness, weakness and headaches.  Hematological: Does not bruise/bleed easily.  Psychiatric/Behavioral: Negative.   All other systems reviewed and are negative.      Objective:   Physical Exam  Constitutional: She is oriented to person, place, and time. She appears well-developed and well-nourished. No distress.    HENT:  Head: Normocephalic.  Nose: Nose normal.  Mouth/Throat: Oropharynx is clear and moist.  Eyes: Pupils are equal, round, and reactive to light. EOM are normal.  Neck: Normal range of motion. Neck supple. No JVD present. Carotid bruit is not present.  Cardiovascular: Normal rate, regular rhythm, normal heart sounds and intact distal pulses.  Pulmonary/Chest: Effort normal and breath sounds normal. No respiratory distress. She has no wheezes. She has no rales. She exhibits no tenderness.  Abdominal: Soft. Normal appearance, normal aorta and bowel sounds are normal. She exhibits no distension, no abdominal bruit, no pulsatile midline mass and no mass. There is no splenomegaly or hepatomegaly. There is no tenderness.  Musculoskeletal: Normal range of motion. She exhibits no edema.  Lymphadenopathy:    She has no cervical adenopathy.  Neurological: She is alert and oriented to person, place, and time. She has normal reflexes.  Skin: Skin is warm and dry.  Psychiatric: She has a normal mood and affect. Her behavior is normal. Judgment and thought content normal.  Nursing note and vitals reviewed.  BP (!) 158/90 (BP Location: Left Arm, Cuff Size: Normal)   Pulse 77   Temp 97.8 F (36.6 C) (Oral)   Ht 5\' 4"  (1.626 m)   Wt 168 lb (76.2 kg)   BMI 28.84 kg/m        Assessment & Plan:  Deanna Becker comes in today with chief complaint of Medical Management of Chronic Issues   Diagnosis and orders addressed:  1. Essential hypertension Please keep diary of blod pressure  Continue all meds  2. Hyperlipidemia with target LDL less than 100 Low fat diet  3. BMI 32.0-32.9,adult Discussed diet and exercise for person with BMI >25 Will recheck weight in 3-6 months   Labs pending Health Maintenance reviewed Diet and exercise encouraged  Follow up plan: 6 months   Mary-Margaret Daphine Deutscher, FNP

## 2018-06-24 NOTE — Patient Instructions (Signed)
DASH Eating Plan DASH stands for "Dietary Approaches to Stop Hypertension." The DASH eating plan is a healthy eating plan that has been shown to reduce high blood pressure (hypertension). It may also reduce your risk for type 2 diabetes, heart disease, and stroke. The DASH eating plan may also help with weight loss. What are tips for following this plan? General guidelines  Avoid eating more than 2,300 mg (milligrams) of salt (sodium) a day. If you have hypertension, you may need to reduce your sodium intake to 1,500 mg a day.  Limit alcohol intake to no more than 1 drink a day for nonpregnant women and 2 drinks a day for men. One drink equals 12 oz of beer, 5 oz of wine, or 1 oz of hard liquor.  Work with your health care provider to maintain a healthy body weight or to lose weight. Ask what an ideal weight is for you.  Get at least 30 minutes of exercise that causes your heart to beat faster (aerobic exercise) most days of the week. Activities may include walking, swimming, or biking.  Work with your health care provider or diet and nutrition specialist (dietitian) to adjust your eating plan to your individual calorie needs. Reading food labels  Check food labels for the amount of sodium per serving. Choose foods with less than 5 percent of the Daily Value of sodium. Generally, foods with less than 300 mg of sodium per serving fit into this eating plan.  To find whole grains, look for the word "whole" as the first word in the ingredient list. Shopping  Buy products labeled as "low-sodium" or "no salt added."  Buy fresh foods. Avoid canned foods and premade or frozen meals. Cooking  Avoid adding salt when cooking. Use salt-free seasonings or herbs instead of table salt or sea salt. Check with your health care provider or pharmacist before using salt substitutes.  Do not fry foods. Cook foods using healthy methods such as baking, boiling, grilling, and broiling instead.  Cook with  heart-healthy oils, such as olive, canola, soybean, or sunflower oil. Meal planning   Eat a balanced diet that includes: ? 5 or more servings of fruits and vegetables each day. At each meal, try to fill half of your plate with fruits and vegetables. ? Up to 6-8 servings of whole grains each day. ? Less than 6 oz of lean meat, poultry, or fish each day. A 3-oz serving of meat is about the same size as a deck of cards. One egg equals 1 oz. ? 2 servings of low-fat dairy each day. ? A serving of nuts, seeds, or beans 5 times each week. ? Heart-healthy fats. Healthy fats called Omega-3 fatty acids are found in foods such as flaxseeds and coldwater fish, like sardines, salmon, and mackerel.  Limit how much you eat of the following: ? Canned or prepackaged foods. ? Food that is high in trans fat, such as fried foods. ? Food that is high in saturated fat, such as fatty meat. ? Sweets, desserts, sugary drinks, and other foods with added sugar. ? Full-fat dairy products.  Do not salt foods before eating.  Try to eat at least 2 vegetarian meals each week.  Eat more home-cooked food and less restaurant, buffet, and fast food.  When eating at a restaurant, ask that your food be prepared with less salt or no salt, if possible. What foods are recommended? The items listed may not be a complete list. Talk with your dietitian about what   dietary choices are best for you. Grains Whole-grain or whole-wheat bread. Whole-grain or whole-wheat pasta. Brown rice. Oatmeal. Quinoa. Bulgur. Whole-grain and low-sodium cereals. Pita bread. Low-fat, low-sodium crackers. Whole-wheat flour tortillas. Vegetables Fresh or frozen vegetables (raw, steamed, roasted, or grilled). Low-sodium or reduced-sodium tomato and vegetable juice. Low-sodium or reduced-sodium tomato sauce and tomato paste. Low-sodium or reduced-sodium canned vegetables. Fruits All fresh, dried, or frozen fruit. Canned fruit in natural juice (without  added sugar). Meat and other protein foods Skinless chicken or turkey. Ground chicken or turkey. Pork with fat trimmed off. Fish and seafood. Egg whites. Dried beans, peas, or lentils. Unsalted nuts, nut butters, and seeds. Unsalted canned beans. Lean cuts of beef with fat trimmed off. Low-sodium, lean deli meat. Dairy Low-fat (1%) or fat-free (skim) milk. Fat-free, low-fat, or reduced-fat cheeses. Nonfat, low-sodium ricotta or cottage cheese. Low-fat or nonfat yogurt. Low-fat, low-sodium cheese. Fats and oils Soft margarine without trans fats. Vegetable oil. Low-fat, reduced-fat, or light mayonnaise and salad dressings (reduced-sodium). Canola, safflower, olive, soybean, and sunflower oils. Avocado. Seasoning and other foods Herbs. Spices. Seasoning mixes without salt. Unsalted popcorn and pretzels. Fat-free sweets. What foods are not recommended? The items listed may not be a complete list. Talk with your dietitian about what dietary choices are best for you. Grains Baked goods made with fat, such as croissants, muffins, or some breads. Dry pasta or rice meal packs. Vegetables Creamed or fried vegetables. Vegetables in a cheese sauce. Regular canned vegetables (not low-sodium or reduced-sodium). Regular canned tomato sauce and paste (not low-sodium or reduced-sodium). Regular tomato and vegetable juice (not low-sodium or reduced-sodium). Pickles. Olives. Fruits Canned fruit in a light or heavy syrup. Fried fruit. Fruit in cream or butter sauce. Meat and other protein foods Fatty cuts of meat. Ribs. Fried meat. Bacon. Sausage. Bologna and other processed lunch meats. Salami. Fatback. Hotdogs. Bratwurst. Salted nuts and seeds. Canned beans with added salt. Canned or smoked fish. Whole eggs or egg yolks. Chicken or turkey with skin. Dairy Whole or 2% milk, cream, and half-and-half. Whole or full-fat cream cheese. Whole-fat or sweetened yogurt. Full-fat cheese. Nondairy creamers. Whipped toppings.  Processed cheese and cheese spreads. Fats and oils Butter. Stick margarine. Lard. Shortening. Ghee. Bacon fat. Tropical oils, such as coconut, palm kernel, or palm oil. Seasoning and other foods Salted popcorn and pretzels. Onion salt, garlic salt, seasoned salt, table salt, and sea salt. Worcestershire sauce. Tartar sauce. Barbecue sauce. Teriyaki sauce. Soy sauce, including reduced-sodium. Steak sauce. Canned and packaged gravies. Fish sauce. Oyster sauce. Cocktail sauce. Horseradish that you find on the shelf. Ketchup. Mustard. Meat flavorings and tenderizers. Bouillon cubes. Hot sauce and Tabasco sauce. Premade or packaged marinades. Premade or packaged taco seasonings. Relishes. Regular salad dressings. Where to find more information:  National Heart, Lung, and Blood Institute: www.nhlbi.nih.gov  American Heart Association: www.heart.org Summary  The DASH eating plan is a healthy eating plan that has been shown to reduce high blood pressure (hypertension). It may also reduce your risk for type 2 diabetes, heart disease, and stroke.  With the DASH eating plan, you should limit salt (sodium) intake to 2,300 mg a day. If you have hypertension, you may need to reduce your sodium intake to 1,500 mg a day.  When on the DASH eating plan, aim to eat more fresh fruits and vegetables, whole grains, lean proteins, low-fat dairy, and heart-healthy fats.  Work with your health care provider or diet and nutrition specialist (dietitian) to adjust your eating plan to your individual   calorie needs. This information is not intended to replace advice given to you by your health care provider. Make sure you discuss any questions you have with your health care provider. Document Released: 07/17/2011 Document Revised: 07/21/2016 Document Reviewed: 07/21/2016 Elsevier Interactive Patient Education  2018 Elsevier Inc.  

## 2018-06-25 LAB — CMP14+EGFR
A/G RATIO: 1.9 (ref 1.2–2.2)
ALBUMIN: 4.8 g/dL (ref 3.5–4.8)
ALK PHOS: 78 IU/L (ref 39–117)
ALT: 18 IU/L (ref 0–32)
AST: 13 IU/L (ref 0–40)
BILIRUBIN TOTAL: 1.3 mg/dL — AB (ref 0.0–1.2)
BUN / CREAT RATIO: 21 (ref 12–28)
BUN: 14 mg/dL (ref 8–27)
CHLORIDE: 98 mmol/L (ref 96–106)
CO2: 24 mmol/L (ref 20–29)
Calcium: 9.5 mg/dL (ref 8.7–10.3)
Creatinine, Ser: 0.67 mg/dL (ref 0.57–1.00)
GFR calc non Af Amer: 89 mL/min/{1.73_m2} (ref >59)
GFR, EST AFRICAN AMERICAN: 102 mL/min/{1.73_m2} (ref >59)
Globulin, Total: 2.5 g/dL (ref 1.5–4.5)
Glucose: 123 mg/dL — ABNORMAL HIGH (ref 65–99)
POTASSIUM: 4.1 mmol/L (ref 3.5–5.2)
Sodium: 139 mmol/L (ref 134–144)
TOTAL PROTEIN: 7.3 g/dL (ref 6.0–8.5)

## 2018-06-25 LAB — LIPID PANEL
Chol/HDL Ratio: 2.9 ratio (ref 0.0–4.4)
Cholesterol, Total: 166 mg/dL (ref 100–199)
HDL: 58 mg/dL (ref >39)
LDL Calculated: 81 mg/dL (ref 0–99)
Triglycerides: 134 mg/dL (ref 0–149)
VLDL Cholesterol Cal: 27 mg/dL (ref 5–40)

## 2018-06-28 NOTE — Addendum Note (Signed)
Addended by: Bennie PieriniMARTIN, MARY-MARGARET on: 06/28/2018 01:08 PM   Modules accepted: Orders

## 2018-06-29 LAB — BAYER DCA HB A1C WAIVED: HB A1C: 6 % (ref <7.0)

## 2018-08-26 ENCOUNTER — Encounter: Payer: Self-pay | Admitting: *Deleted

## 2018-09-23 ENCOUNTER — Ambulatory Visit: Payer: Medicare Other | Admitting: Nurse Practitioner

## 2018-09-24 ENCOUNTER — Ambulatory Visit: Payer: Medicare Other | Admitting: Nurse Practitioner

## 2018-10-01 ENCOUNTER — Other Ambulatory Visit: Payer: Self-pay | Admitting: Nurse Practitioner

## 2018-10-01 DIAGNOSIS — I10 Essential (primary) hypertension: Secondary | ICD-10-CM

## 2018-10-01 DIAGNOSIS — E785 Hyperlipidemia, unspecified: Secondary | ICD-10-CM

## 2018-10-28 ENCOUNTER — Encounter: Payer: Self-pay | Admitting: Nurse Practitioner

## 2018-10-28 ENCOUNTER — Ambulatory Visit: Payer: Medicare Other | Admitting: Nurse Practitioner

## 2018-10-28 ENCOUNTER — Other Ambulatory Visit: Payer: Self-pay

## 2018-10-28 VITALS — BP 143/75 | HR 62 | Temp 97.5°F | Ht 64.0 in | Wt 168.0 lb

## 2018-10-28 DIAGNOSIS — E785 Hyperlipidemia, unspecified: Secondary | ICD-10-CM | POA: Diagnosis not present

## 2018-10-28 DIAGNOSIS — I10 Essential (primary) hypertension: Secondary | ICD-10-CM | POA: Diagnosis not present

## 2018-10-28 DIAGNOSIS — Z6832 Body mass index (BMI) 32.0-32.9, adult: Secondary | ICD-10-CM | POA: Diagnosis not present

## 2018-10-28 DIAGNOSIS — R739 Hyperglycemia, unspecified: Secondary | ICD-10-CM | POA: Diagnosis not present

## 2018-10-28 DIAGNOSIS — Z1212 Encounter for screening for malignant neoplasm of rectum: Secondary | ICD-10-CM

## 2018-10-28 DIAGNOSIS — Z1211 Encounter for screening for malignant neoplasm of colon: Secondary | ICD-10-CM

## 2018-10-28 LAB — BAYER DCA HB A1C WAIVED: HB A1C (BAYER DCA - WAIVED): 5.9 % (ref <7.0)

## 2018-10-28 MED ORDER — SIMVASTATIN 40 MG PO TABS: ORAL_TABLET | ORAL | 1 refills | Status: DC

## 2018-10-28 MED ORDER — LISINOPRIL 40 MG PO TABS: ORAL_TABLET | ORAL | 1 refills | Status: DC

## 2018-10-28 MED ORDER — METOPROLOL TARTRATE 100 MG PO TABS: 100.0000 mg | ORAL_TABLET | Freq: Two times a day (BID) | ORAL | 1 refills | Status: DC

## 2018-10-28 MED ORDER — HYDROCHLOROTHIAZIDE 25 MG PO TABS: 25.0000 mg | ORAL_TABLET | Freq: Every day | ORAL | 1 refills | Status: DC

## 2018-10-28 MED ORDER — CLONIDINE HCL 0.3 MG PO TABS: 0.3000 mg | ORAL_TABLET | Freq: Two times a day (BID) | ORAL | 1 refills | Status: DC

## 2018-10-28 NOTE — Progress Notes (Signed)
Subjective:    Patient ID: [name], female    DOB: 1945-12-17, 73 y.o.   MRN: [id number]   Chief Complaint: Medical Management of Chronic Issues   HPI:  1. Essential hypertension  No c/o chest pain, sob or headache. Does not check blood pressure at home. BP Readings from Last 3 Encounters:  10/28/18 (!) 143/75  06/24/18 (!) 158/90  03/09/18 136/88     2. Hyperlipidemia with target LDL less than 100  Does not watch diet at home.  3. Hyperglycemia  Does not check blood sugar at home. hgba1c 6.0 in November 2019  4. BMI 32.0-32.9,adult  No recent weight changes    Outpatient Encounter Medications as of 10/28/2018  Medication Sig  . aspirin EC 81 MG tablet Take 1 tablet (81 mg total) by mouth daily.  . cloNIDine (CATAPRES) 0.3 MG tablet TAKE 1 TABLET BY MOUTH TWICE DAILY  . hydrochlorothiazide (HYDRODIURIL) 25 MG tablet TAKE 1 TABLET BY MOUTH EVERY DAY  . lisinopril (PRINIVIL,ZESTRIL) 40 MG tablet TAKE 1 TABLET(40 MG) BY MOUTH DAILY  . metoprolol tartrate (LOPRESSOR) 100 MG tablet TAKE 1 TABLET BY MOUTH TWICE DAILY  . simvastatin (ZOCOR) 40 MG tablet TAKE 1 TABLET(40 MG) BY MOUTH AT BEDTIME     New complaints: None today  Social history: Retired several years ago.    Review of Systems  Constitutional: Negative for activity change and appetite change.  HENT: Negative.   Eyes: Negative for pain.  Respiratory: Negative for shortness of breath.   Cardiovascular: Negative for chest pain, palpitations and leg swelling.  Gastrointestinal: Negative for abdominal pain.  Endocrine: Negative for polydipsia.  Genitourinary: Negative.   Skin: Negative for rash.  Neurological: Negative for dizziness, weakness and headaches.  Hematological: Does not bruise/bleed easily.  Psychiatric/Behavioral: Negative.   All other systems reviewed and are negative.      Objective:   Physical Exam Vitals signs and nursing note reviewed.  Constitutional:      General: She is not  in acute distress.    Appearance: Normal appearance. She is well-developed.  HENT:     Head: Normocephalic.     Nose: Nose normal.  Eyes:     Pupils: Pupils are equal, round, and reactive to light.  Neck:     Musculoskeletal: Normal range of motion and neck supple.     Vascular: No carotid bruit or JVD.  Cardiovascular:     Rate and Rhythm: Normal rate and regular rhythm.     Heart sounds: Normal heart sounds.  Pulmonary:     Effort: Pulmonary effort is normal. No respiratory distress.     Breath sounds: Normal breath sounds. No wheezing or rales.  Chest:     Chest wall: No tenderness.  Abdominal:     General: Bowel sounds are normal. There is no distension or abdominal bruit.     Palpations: Abdomen is soft. There is no hepatomegaly, splenomegaly, mass or pulsatile mass.     Tenderness: There is no abdominal tenderness.  Musculoskeletal: Normal range of motion.  Lymphadenopathy:     Cervical: No cervical adenopathy.  Skin:    General: Skin is warm and dry.  Neurological:     Mental Status: She is alert and oriented to person, place, and time.     Deep Tendon Reflexes: Reflexes are normal and symmetric.  Psychiatric:        Behavior: Behavior normal.        Thought Content: Thought content normal.  Judgment: Judgment normal.    BP (!) 143/75   Pulse 62   Temp (!) 97.5 F (36.4 C) (Oral)   Ht '5\' 4"'  (1.626 m)   Wt 168 lb (76.2 kg)   BMI 28.84 kg/m   hgba1c 5.9%    Assessment & Plan:  Nayeli Calvert comes in today with chief complaint of Medical Management of Chronic Issues   Diagnosis and orders addressed:  1. Essential hypertension Low sodium diet - CMP14+EGFR - metoprolol tartrate (LOPRESSOR) 100 MG tablet; Take 1 tablet (100 mg total) by mouth 2 (two) times daily.  Dispense: 180 tablet; Refill: 1 - lisinopril (PRINIVIL,ZESTRIL) 40 MG tablet; TAKE 1 TABLET(40 MG) BY MOUTH DAILY  Dispense: 90 tablet; Refill: 1 - hydrochlorothiazide (HYDRODIURIL) 25 MG  tablet; Take 1 tablet (25 mg total) by mouth daily.  Dispense: 90 tablet; Refill: 1 - cloNIDine (CATAPRES) 0.3 MG tablet; Take 1 tablet (0.3 mg total) by mouth 2 (two) times daily.  Dispense: 180 tablet; Refill: 1  2. Hyperlipidemia with target LDL less than 100 Low fat diet - Lipid panel - simvastatin (ZOCOR) 40 MG tablet; TAKE 1 TABLET(40 MG) BY MOUTH AT BEDTIME  Dispense: 90 tablet; Refill: 1  3. Hyperglycemia Watch carbs in diet - Bayer DCA Hb A1c Waived  4. BMI 32.0-32.9,adult Discussed diet and exercise for person with BMI >25 Will recheck weight in 3-6 months   Labs pending Health Maintenance reviewed Diet and exercise encouraged  Follow up plan: 6 months   Mary-Margaret Hassell Done, FNP

## 2018-10-28 NOTE — Addendum Note (Signed)
Addended by: Bennie Pierini on: 10/28/2018 09:04 AM   Modules accepted: Orders

## 2018-10-28 NOTE — Patient Instructions (Signed)
Health Maintenance After Age 72 After age 72, you are at a higher risk for certain long-term diseases and infections as well as injuries from falls. Falls are a major cause of broken bones and head injuries in people who are older than age 72. Getting regular preventive care can help to keep you healthy and well. Preventive care includes getting regular testing and making lifestyle changes as recommended by your health care provider. Talk with your health care provider about:  Which screenings and tests you should have. A screening is a test that checks for a disease when you have no symptoms.  A diet and exercise plan that is right for you. What should I know about screenings and tests to prevent falls? Screening and testing are the best ways to find a health problem early. Early diagnosis and treatment give you the best chance of managing medical conditions that are common after age 72. Certain conditions and lifestyle choices may make you more likely to have a fall. Your health care provider may recommend:  Regular vision checks. Poor vision and conditions such as cataracts can make you more likely to have a fall. If you wear glasses, make sure to get your prescription updated if your vision changes.  Medicine review. Work with your health care provider to regularly review all of the medicines you are taking, including over-the-counter medicines. Ask your health care provider about any side effects that may make you more likely to have a fall. Tell your health care provider if any medicines that you take make you feel dizzy or sleepy.  Osteoporosis screening. Osteoporosis is a condition that causes the bones to get weaker. This can make the bones weak and cause them to break more easily.  Blood pressure screening. Blood pressure changes and medicines to control blood pressure can make you feel dizzy.  Strength and balance checks. Your health care provider may recommend certain tests to check your  strength and balance while standing, walking, or changing positions.  Foot health exam. Foot pain and numbness, as well as not wearing proper footwear, can make you more likely to have a fall.  Depression screening. You may be more likely to have a fall if you have a fear of falling, feel emotionally low, or feel unable to do activities that you used to do.  Alcohol use screening. Using too much alcohol can affect your balance and may make you more likely to have a fall. What actions can I take to lower my risk of falls? General instructions  Talk with your health care provider about your risks for falling. Tell your health care provider if: ? You fall. Be sure to tell your health care provider about all falls, even ones that seem minor. ? You feel dizzy, sleepy, or off-balance.  Take over-the-counter and prescription medicines only as told by your health care provider. These include any supplements.  Eat a healthy diet and maintain a healthy weight. A healthy diet includes low-fat dairy products, low-fat (lean) meats, and fiber from whole grains, beans, and lots of fruits and vegetables. Home safety  Remove any tripping hazards, such as rugs, cords, and clutter.  Install safety equipment such as grab bars in bathrooms and safety rails on stairs.  Keep rooms and walkways well-lit. Activity   Follow a regular exercise program to stay fit. This will help you maintain your balance. Ask your health care provider what types of exercise are appropriate for you.  If you need a cane or   walker, use it as recommended by your health care provider.  Wear supportive shoes that have nonskid soles. Lifestyle  Do not drink alcohol if your health care provider tells you not to drink.  If you drink alcohol, limit how much you have: ? 0-1 drink a day for women. ? 0-2 drinks a day for men.  Be aware of how much alcohol is in your drink. In the [place] one drink equals one typical bottle of beer (12  oz), one-half glass of wine (5 oz), or one shot of hard liquor (1 oz).  Do not use any products that contain nicotine or tobacco, such as cigarettes and e-cigarettes. If you need help quitting, ask your health care provider. Summary  Having a healthy lifestyle and getting preventive care can help to protect your health and wellness after age 72.  Screening and testing are the best way to find a health problem early and help you avoid having a fall. Early diagnosis and treatment give you the best chance for managing medical conditions that are more common for people who are older than age 72.  Falls are a major cause of broken bones and head injuries in people who are older than age 72. Take precautions to prevent a fall at home.  Work with your health care provider to learn what changes you can make to improve your health and wellness and to prevent falls. This information is not intended to replace advice given to you by your health care provider. Make sure you discuss any questions you have with your health care provider. Document Released: 06/10/2017 Document Revised: 06/10/2017 Document Reviewed: 06/10/2017 Elsevier Interactive Patient Education  2019 [employer]  

## 2018-10-29 LAB — LIPID PANEL
Chol/HDL Ratio: 3 ratio (ref 0.0–4.4)
Cholesterol, Total: 167 mg/dL (ref 100–199)
HDL: 56 mg/dL (ref >39)
LDL Calculated: 80 mg/dL (ref 0–99)
Triglycerides: 155 mg/dL — ABNORMAL HIGH (ref 0–149)
VLDL CHOLESTEROL CAL: 31 mg/dL (ref 5–40)

## 2018-10-29 LAB — CMP14+EGFR
A/G RATIO: 1.8 (ref 1.2–2.2)
ALT: 15 IU/L (ref 0–32)
AST: 13 IU/L (ref 0–40)
Albumin: 4.5 g/dL (ref 3.7–4.7)
Alkaline Phosphatase: 80 IU/L (ref 39–117)
BUN/Creatinine Ratio: 25 (ref 12–28)
BUN: 19 mg/dL (ref 8–27)
Bilirubin Total: 1.4 mg/dL — ABNORMAL HIGH (ref 0.0–1.2)
CALCIUM: 9.3 mg/dL (ref 8.7–10.3)
CO2: 24 mmol/L (ref 20–29)
CREATININE: 0.77 mg/dL (ref 0.57–1.00)
Chloride: 98 mmol/L (ref 96–106)
GFR calc Af Amer: 90 mL/min/{1.73_m2} (ref >59)
GFR, EST NON AFRICAN AMERICAN: 78 mL/min/{1.73_m2} (ref >59)
GLOBULIN, TOTAL: 2.5 g/dL (ref 1.5–4.5)
Glucose: 125 mg/dL — ABNORMAL HIGH (ref 65–99)
POTASSIUM: 4.1 mmol/L (ref 3.5–5.2)
SODIUM: 139 mmol/L (ref 134–144)
TOTAL PROTEIN: 7 g/dL (ref 6.0–8.5)

## 2018-12-23 ENCOUNTER — Ambulatory Visit: Payer: Medicare Other

## 2019-04-27 ENCOUNTER — Other Ambulatory Visit: Payer: Self-pay | Admitting: Nurse Practitioner

## 2019-04-27 DIAGNOSIS — E785 Hyperlipidemia, unspecified: Secondary | ICD-10-CM

## 2019-04-27 DIAGNOSIS — I10 Essential (primary) hypertension: Secondary | ICD-10-CM

## 2019-04-27 NOTE — Telephone Encounter (Signed)
Next OV 06/07/19

## 2019-04-29 ENCOUNTER — Ambulatory Visit: Payer: Medicare Other | Admitting: Nurse Practitioner

## 2019-06-06 ENCOUNTER — Other Ambulatory Visit: Payer: Self-pay

## 2019-06-07 ENCOUNTER — Encounter: Payer: Self-pay | Admitting: Nurse Practitioner

## 2019-06-07 ENCOUNTER — Ambulatory Visit (INDEPENDENT_AMBULATORY_CARE_PROVIDER_SITE_OTHER): Payer: Medicare Other | Admitting: Nurse Practitioner

## 2019-06-07 VITALS — BP 136/82 | HR 86 | Temp 98.2°F | Resp 24 | Ht 64.0 in | Wt 173.0 lb

## 2019-06-07 DIAGNOSIS — E785 Hyperlipidemia, unspecified: Secondary | ICD-10-CM

## 2019-06-07 DIAGNOSIS — Z1212 Encounter for screening for malignant neoplasm of rectum: Secondary | ICD-10-CM

## 2019-06-07 DIAGNOSIS — I10 Essential (primary) hypertension: Secondary | ICD-10-CM

## 2019-06-07 DIAGNOSIS — Z23 Encounter for immunization: Secondary | ICD-10-CM

## 2019-06-07 DIAGNOSIS — Z6832 Body mass index (BMI) 32.0-32.9, adult: Secondary | ICD-10-CM

## 2019-06-07 MED ORDER — METOPROLOL TARTRATE 100 MG PO TABS: ORAL_TABLET | ORAL | 1 refills | Status: DC

## 2019-06-07 MED ORDER — LISINOPRIL 40 MG PO TABS: 40.0000 mg | ORAL_TABLET | Freq: Every day | ORAL | 1 refills | Status: DC

## 2019-06-07 MED ORDER — CLONIDINE HCL 0.3 MG PO TABS: ORAL_TABLET | ORAL | 1 refills | Status: DC

## 2019-06-07 MED ORDER — SIMVASTATIN 40 MG PO TABS: 40.0000 mg | ORAL_TABLET | Freq: Every day | ORAL | 1 refills | Status: DC

## 2019-06-07 MED ORDER — HYDROCHLOROTHIAZIDE 25 MG PO TABS: ORAL_TABLET | ORAL | 1 refills | Status: DC

## 2019-06-07 NOTE — Addendum Note (Signed)
Addended by: Rolena Infante on: 06/07/2019 02:44 PM   Modules accepted: Orders

## 2019-06-07 NOTE — Patient Instructions (Signed)
Exercising to Stay Healthy To become healthy and stay healthy, it is recommended that you do moderate-intensity and vigorous-intensity exercise. You can tell that you are exercising at a moderate intensity if your heart starts beating faster and you start breathing faster but can still hold a conversation. You can tell that you are exercising at a vigorous intensity if you are breathing much harder and faster and cannot hold a conversation while exercising. Exercising regularly is important. It has many health benefits, such as:  Improving overall fitness, flexibility, and endurance.  Increasing bone density.  Helping with weight control.  Decreasing body fat.  Increasing muscle strength.  Reducing stress and tension.  Improving overall health. How often should I exercise? Choose an activity that you enjoy, and set realistic goals. Your health care provider can help you make an activity plan that works for you. Exercise regularly as told by your health care provider. This may include:  Doing strength training two times a week, such as: ? Lifting weights. ? Using resistance bands. ? Push-ups. ? Sit-ups. ? Yoga.  Doing a certain intensity of exercise for a given amount of time. Choose from these options: ? A total of 150 minutes of moderate-intensity exercise every week. ? A total of 75 minutes of vigorous-intensity exercise every week. ? A mix of moderate-intensity and vigorous-intensity exercise every week. Children, pregnant women, people who have not exercised regularly, people who are overweight, and older adults may need to talk with a health care provider about what activities are safe to do. If you have a medical condition, be sure to talk with your health care provider before you start a new exercise program. What are some exercise ideas? Moderate-intensity exercise ideas include:  Walking 1 mile (1.6 km) in about 15 minutes.  Biking.  Hiking.  Golfing.  Dancing.   Water aerobics. Vigorous-intensity exercise ideas include:  Walking 4.5 miles (7.2 km) or more in about 1 hour.  Jogging or running 5 miles (8 km) in about 1 hour.  Biking 10 miles (16.1 km) or more in about 1 hour.  Lap swimming.  Roller-skating or in-line skating.  Cross-country skiing.  Vigorous competitive sports, such as football, basketball, and soccer.  Jumping rope.  Aerobic dancing. What are some everyday activities that can help me to get exercise?  Yard work, such as: ? Pushing a lawn mower. ? Raking and bagging leaves.  Washing your car.  Pushing a stroller.  Shoveling snow.  Gardening.  Washing windows or floors. How can I be more active in my day-to-day activities?  Use stairs instead of an elevator.  Take a walk during your lunch break.  If you drive, park your car farther away from your work or school.  If you take public transportation, get off one stop early and walk the rest of the way.  Stand up or walk around during all of your indoor phone calls.  Get up, stretch, and walk around every 30 minutes throughout the day.  Enjoy exercise with a friend. Support to continue exercising will help you keep a regular routine of activity. What guidelines can I follow while exercising?  Before you start a new exercise program, talk with your health care provider.  Do not exercise so much that you hurt yourself, feel dizzy, or get very short of breath.  Wear comfortable clothes and wear shoes with good support.  Drink plenty of water while you exercise to prevent dehydration or heat stroke.  Work out until your breathing   and your heartbeat get faster. Where to find more information  [place] Department of Health and Human Services: www.hhs.gov  Centers for Disease Control and Prevention (CDC): www.cdc.gov Summary  Exercising regularly is important. It will improve your overall fitness, flexibility, and endurance.  Regular exercise also will  improve your overall health. It can help you control your weight, reduce stress, and improve your bone density.  Do not exercise so much that you hurt yourself, feel dizzy, or get very short of breath.  Before you start a new exercise program, talk with your health care provider. This information is not intended to replace advice given to you by your health care provider. Make sure you discuss any questions you have with your health care provider. Document Released: 08/30/2010 Document Revised: 07/10/2017 Document Reviewed: 06/18/2017 Elsevier Patient Education  2020 Else[employer] Inc.  

## 2019-06-07 NOTE — Progress Notes (Addendum)
Subjective:    Patient ID: Deanna Becker, female    DOB: 29-Jan-1946, 73 y.o.   MRN: [id number]   Chief Complaint: Medical Management of Chronic Issues    HPI:  1. Essential hypertension No c/lo chest pain, sob or headache. Does not check blood pressure at home. BP Readings from Last 3 Encounters:  06/07/19 (!) 186/93  10/28/18 (!) 143/75  06/24/18 (!) 158/90     2. Hyperlipidemia with target LDL less than 100 Tries to watch diet and is walking daily. Takes zocor daily Lab Results  Component Value Date   CHOL 167 10/28/2018   HDL 56 10/28/2018   LDLCALC 80 10/28/2018   TRIG 155 (H) 10/28/2018   CHOLHDL 3.0 10/28/2018     3. BMI 32.0-32.9,adult Weight is up 5 lbs since last visit Wt Readings from Last 3 Encounters:  06/07/19 173 lb (78.5 kg)  10/28/18 168 lb (76.2 kg)  06/24/18 168 lb (76.2 kg)   BMI Readings from Last 3 Encounters:  06/07/19 29.70 kg/m  10/28/18 28.84 kg/m  06/24/18 28.84 kg/m       Outpatient Encounter Medications as of 06/07/2019  Medication Sig  . aspirin EC 81 MG tablet Take 1 tablet (81 mg total) by mouth daily.  . cloNIDine (CATAPRES) 0.3 MG tablet TAKE 1 TABLET(0.3 MG) BY MOUTH TWICE DAILY  . hydrochlorothiazide (HYDRODIURIL) 25 MG tablet TAKE 1 TABLET(25 MG) BY MOUTH DAILY  . lisinopril (ZESTRIL) 40 MG tablet TAKE 1 TABLET(40 MG) BY MOUTH DAILY  . metoprolol tartrate (LOPRESSOR) 100 MG tablet TAKE 1 TABLET(100 MG) BY MOUTH TWICE DAILY  . simvastatin (ZOCOR) 40 MG tablet TAKE 1 TABLET(40 MG) BY MOUTH AT BEDTIME     Past Surgical History:  Procedure Laterality Date  . gangloin cyst      Family History  Problem Relation Age of Onset  . Asthma Mother   . Heart disease Father     New complaints: None today  Social history: Lives with her husband. Is helping her grandson with his school work.  Controlled substance contract: n/a    Review of Systems  Constitutional: Negative for activity change and appetite  change.  HENT: Negative.   Eyes: Negative for pain.  Respiratory: Negative for shortness of breath.   Cardiovascular: Negative for chest pain, palpitations and leg swelling.  Gastrointestinal: Negative for abdominal pain.  Endocrine: Negative for polydipsia.  Genitourinary: Negative.   Skin: Negative for rash.  Neurological: Negative for dizziness, weakness and headaches.  Hematological: Does not bruise/bleed easily.  Psychiatric/Behavioral: Negative.   All other systems reviewed and are negative.      Objective:   Physical Exam Vitals signs and nursing note reviewed.  Constitutional:      General: She is not in acute distress.    Appearance: Normal appearance. She is well-developed.  HENT:     Head: Normocephalic.     Nose: Nose normal.  Eyes:     Pupils: Pupils are equal, round, and reactive to light.  Neck:     Musculoskeletal: Normal range of motion and neck supple.     Vascular: No carotid bruit or JVD.  Cardiovascular:     Rate and Rhythm: Normal rate and regular rhythm.     Heart sounds: Murmur (3/6 systolic murmur) present.  Pulmonary:     Effort: Pulmonary effort is normal. No respiratory distress.     Breath sounds: Normal breath sounds. No wheezing or rales.  Chest:     Chest wall: No tenderness.  Abdominal:     General: Bowel sounds are normal. There is no distension or abdominal bruit.     Palpations: Abdomen is soft. There is no hepatomegaly, splenomegaly, mass or pulsatile mass.     Tenderness: There is no abdominal tenderness.  Musculoskeletal: Normal range of motion.  Lymphadenopathy:     Cervical: No cervical adenopathy.  Skin:    General: Skin is warm and dry.  Neurological:     Mental Status: She is alert and oriented to person, place, and time.     Deep Tendon Reflexes: Reflexes are normal and symmetric.  Psychiatric:        Behavior: Behavior normal.        Thought Content: Thought content normal.        Judgment: Judgment normal.     BP  136/82 (BP Location: Left Arm)   Pulse 86   Temp 98.2 F (36.8 C) (Temporal)   Resp (!) 24   Ht 5' 4" (1.626 m)   Wt 173 lb (78.5 kg)   SpO2 99%   BMI 29.70 kg/m         Assessment & Plan:  Deanna Becker comes in today with chief complaint of Medical Management of Chronic Issues   Diagnosis and orders addressed:  1. Essential hypertension Low sodium diet - CMP14+EGFR - metoprolol tartrate (LOPRESSOR) 100 MG tablet; TAKE 1 TABLET(100 MG) BY MOUTH TWICE DAILY  Dispense: 180 tablet; Refill: 1 - lisinopril (ZESTRIL) 40 MG tablet; Take 1 tablet (40 mg total) by mouth daily.  Dispense: 90 tablet; Refill: 1 - hydrochlorothiazide (HYDRODIURIL) 25 MG tablet; TAKE 1 TABLET(25 MG) BY MOUTH DAILY  Dispense: 90 tablet; Refill: 1 - cloNIDine (CATAPRES) 0.3 MG tablet; TAKE 1 TABLET(0.3 MG) BY MOUTH TWICE DAILY  Dispense: 180 tablet; Refill: 1  2. Hyperlipidemia with target LDL less than 100 Low fat diet - Lipid panel - simvastatin (ZOCOR) 40 MG tablet; Take 1 tablet (40 mg total) by mouth daily at 6 PM.  Dispense: 90 tablet; Refill: 1  3. BMI 32.0-32.9,adult Discussed diet and exercise for person with BMI >25 Will recheck weight in 3-6 months   Labs pending Health Maintenance reviewed Diet and exercise encouraged  Follow up plan: 6 months   Mary-Margaret Martin, FNP  

## 2019-06-08 LAB — CMP14+EGFR
ALT: 19 IU/L (ref 0–32)
AST: 17 IU/L (ref 0–40)
Albumin/Globulin Ratio: 1.8 (ref 1.2–2.2)
Albumin: 4.7 g/dL (ref 3.7–4.7)
Alkaline Phosphatase: 95 IU/L (ref 39–117)
BUN/Creatinine Ratio: 25 (ref 12–28)
BUN: 18 mg/dL (ref 8–27)
Bilirubin Total: 1.3 mg/dL — ABNORMAL HIGH (ref 0.0–1.2)
CO2: 27 mmol/L (ref 20–29)
Calcium: 9.8 mg/dL (ref 8.7–10.3)
Chloride: 98 mmol/L (ref 96–106)
Creatinine, Ser: 0.72 mg/dL (ref 0.57–1.00)
GFR calc Af Amer: 97 mL/min/{1.73_m2} (ref >59)
GFR calc non Af Amer: 84 mL/min/{1.73_m2} (ref >59)
Globulin, Total: 2.6 g/dL (ref 1.5–4.5)
Glucose: 110 mg/dL — ABNORMAL HIGH (ref 65–99)
Potassium: 4 mmol/L (ref 3.5–5.2)
Sodium: 136 mmol/L (ref 134–144)
Total Protein: 7.3 g/dL (ref 6.0–8.5)

## 2019-06-08 LAB — LIPID PANEL
Chol/HDL Ratio: 3.1 ratio (ref 0.0–4.4)
Cholesterol, Total: 177 mg/dL (ref 100–199)
HDL: 58 mg/dL
LDL Chol Calc (NIH): 94 mg/dL (ref 0–99)
Triglycerides: 142 mg/dL (ref 0–149)
VLDL Cholesterol Cal: 25 mg/dL (ref 5–40)

## 2019-08-07 ENCOUNTER — Other Ambulatory Visit: Payer: Self-pay | Admitting: Nurse Practitioner

## 2019-08-07 DIAGNOSIS — E785 Hyperlipidemia, unspecified: Secondary | ICD-10-CM

## 2019-08-07 DIAGNOSIS — I10 Essential (primary) hypertension: Secondary | ICD-10-CM

## 2019-10-27 ENCOUNTER — Telehealth: Payer: Self-pay | Admitting: Nurse Practitioner

## 2019-10-27 NOTE — Telephone Encounter (Signed)
Left detailed message no call was made

## 2019-12-06 ENCOUNTER — Ambulatory Visit: Payer: Self-pay | Admitting: Nurse Practitioner

## 2019-12-15 ENCOUNTER — Ambulatory Visit: Payer: Medicare PPO | Admitting: Nurse Practitioner

## 2019-12-15 ENCOUNTER — Encounter: Payer: Self-pay | Admitting: Nurse Practitioner

## 2019-12-15 ENCOUNTER — Other Ambulatory Visit: Payer: Self-pay

## 2019-12-15 VITALS — BP 140/88 | HR 73 | Temp 97.5°F | Ht 64.0 in | Wt 175.0 lb

## 2019-12-15 DIAGNOSIS — E785 Hyperlipidemia, unspecified: Secondary | ICD-10-CM

## 2019-12-15 DIAGNOSIS — I10 Essential (primary) hypertension: Secondary | ICD-10-CM

## 2019-12-15 DIAGNOSIS — Z6832 Body mass index (BMI) 32.0-32.9, adult: Secondary | ICD-10-CM | POA: Diagnosis not present

## 2019-12-15 MED ORDER — CLONIDINE HCL 0.3 MG PO TABS: ORAL_TABLET | ORAL | 1 refills | Status: DC

## 2019-12-15 MED ORDER — HYDROCHLOROTHIAZIDE 25 MG PO TABS: ORAL_TABLET | ORAL | 1 refills | Status: DC

## 2019-12-15 MED ORDER — METOPROLOL TARTRATE 100 MG PO TABS: ORAL_TABLET | ORAL | 1 refills | Status: DC

## 2019-12-15 MED ORDER — LISINOPRIL 40 MG PO TABS: ORAL_TABLET | ORAL | 1 refills | Status: DC

## 2019-12-15 MED ORDER — SIMVASTATIN 40 MG PO TABS: ORAL_TABLET | ORAL | 1 refills | Status: DC

## 2019-12-15 NOTE — Patient Instructions (Signed)
Exercising to Stay Healthy To become healthy and stay healthy, it is recommended that you do moderate-intensity and vigorous-intensity exercise. You can tell that you are exercising at a moderate intensity if your heart starts beating faster and you start breathing faster but can still hold a conversation. You can tell that you are exercising at a vigorous intensity if you are breathing much harder and faster and cannot hold a conversation while exercising. Exercising regularly is important. It has many health benefits, such as:  Improving overall fitness, flexibility, and endurance.  Increasing bone density.  Helping with weight control.  Decreasing body fat.  Increasing muscle strength.  Reducing stress and tension.  Improving overall health. How often should I exercise? Choose an activity that you enjoy, and set realistic goals. Your health care provider can help you make an activity plan that works for you. Exercise regularly as told by your health care provider. This may include:  Doing strength training two times a week, such as: ? Lifting weights. ? Using resistance bands. ? Push-ups. ? Sit-ups. ? Yoga.  Doing a certain intensity of exercise for a given amount of time. Choose from these options: ? A total of 150 minutes of moderate-intensity exercise every week. ? A total of 75 minutes of vigorous-intensity exercise every week. ? A mix of moderate-intensity and vigorous-intensity exercise every week. Children, pregnant women, people who have not exercised regularly, people who are overweight, and older adults may need to talk with a health care provider about what activities are safe to do. If you have a medical condition, be sure to talk with your health care provider before you start a new exercise program. What are some exercise ideas? Moderate-intensity exercise ideas include:  Walking 1 mile (1.6 km) in about 15  minutes.  Biking.  Hiking.  Golfing.  Dancing.  Water aerobics. Vigorous-intensity exercise ideas include:  Walking 4.5 miles (7.2 km) or more in about 1 hour.  Jogging or running 5 miles (8 km) in about 1 hour.  Biking 10 miles (16.1 km) or more in about 1 hour.  Lap swimming.  Roller-skating or in-line skating.  Cross-country skiing.  Vigorous competitive sports, such as football, basketball, and soccer.  Jumping rope.  Aerobic dancing. What are some everyday activities that can help me to get exercise?  Yard work, such as: ? Pushing a lawn mower. ? Raking and bagging leaves.  Washing your car.  Pushing a stroller.  Shoveling snow.  Gardening.  Washing windows or floors. How can I be more active in my day-to-day activities?  Use stairs instead of an elevator.  Take a walk during your lunch break.  If you drive, park your car farther away from your work or school.  If you take public transportation, get off one stop early and walk the rest of the way.  Stand up or walk around during all of your indoor phone calls.  Get up, stretch, and walk around every 30 minutes throughout the day.  Enjoy exercise with a friend. Support to continue exercising will help you keep a regular routine of activity. What guidelines can I follow while exercising?  Before you start a new exercise program, talk with your health care provider.  Do not exercise so much that you hurt yourself, feel dizzy, or get very short of breath.  Wear comfortable clothes and wear shoes with good support.  Drink plenty of water while you exercise to prevent dehydration or heat stroke.  Work out until your breathing   and your heartbeat get faster. Where to find more information  [place] Department of Health and Human Services: www.hhs.gov  Centers for Disease Control and Prevention (CDC): www.cdc.gov Summary  Exercising regularly is important. It will improve your overall fitness,  flexibility, and endurance.  Regular exercise also will improve your overall health. It can help you control your weight, reduce stress, and improve your bone density.  Do not exercise so much that you hurt yourself, feel dizzy, or get very short of breath.  Before you start a new exercise program, talk with your health care provider. This information is not intended to replace advice given to you by your health care provider. Make sure you discuss any questions you have with your health care provider. Document Revised: 07/10/2017 Document Reviewed: 06/18/2017 Elsevier Patient Education  2020 Elsevier Inc.  

## 2019-12-15 NOTE — Progress Notes (Signed)
Subjective:    Patient ID: Deanna Becker, female    DOB: Mar 26, 1946, 74 y.o.   MRN: [id number]   Chief Complaint: Medical Management of Chronic Issues    HPI: Patient presents today for medical management of chronic issues.  1. Essential hypertension Patient denies swelling, palpitations, dizziness, or headaches. Does not check blood pressures at home. Blood pressure on recheck today 140/80. BP Readings from Last 3 Encounters:  12/15/19 (!) 169/86  06/07/19 136/82  10/28/18 (!) 143/75    2. Hyperlipidemia with target LDL less than 100 Patient admits to eating a heart healthy diet. She states she walks about 1 1/2 miles a day with a church group. Lab Results  Component Value Date   CHOL 177 06/07/2019   HDL 58 06/07/2019   LDLCALC 94 06/07/2019   TRIG 142 06/07/2019   CHOLHDL 3.1 06/07/2019     3. BMI 32.0-32.9,adult BMI Readings from Last 3 Encounters:  12/15/19 30.04 kg/m  06/07/19 29.70 kg/m  10/28/18 28.84 kg/m   Wt Readings from Last 3 Encounters:  12/15/19 175 lb (79.4 kg)  06/07/19 173 lb (78.5 kg)  10/28/18 168 lb (76.2 kg)     New complaints: Patient denies new complaints today.  Social history: Patient denies new social complaints today.  Controlled substance contract: n/a     Review of Systems  Constitutional: Negative for diaphoresis.  Eyes: Negative for pain.  Respiratory: Negative for shortness of breath.   Cardiovascular: Negative for chest pain, palpitations and leg swelling.  Gastrointestinal: Negative for abdominal pain.  Endocrine: Negative for polydipsia.  Skin: Negative for rash.  Neurological: Negative for dizziness, weakness and headaches.  Hematological: Does not bruise/bleed easily.  All other systems reviewed and are negative.      Objective:   Physical Exam Constitutional:      Appearance: Normal appearance.  HENT:     Head: Normocephalic.  Eyes:     Conjunctiva/sclera: Conjunctivae normal.  Cardiovascular:       Rate and Rhythm: Normal rate and regular rhythm.     Heart sounds: Murmur present. Systolic murmur present with a grade of 3/6.  Pulmonary:     Effort: Pulmonary effort is normal.     Breath sounds: Normal breath sounds.  Abdominal:     General: Bowel sounds are normal.  Musculoskeletal:        General: Normal range of motion.     Cervical back: Neck supple.  Skin:    General: Skin is warm and dry.  Neurological:     Mental Status: She is oriented to person, place, and time.  Psychiatric:        Mood and Affect: Mood normal.        Behavior: Behavior normal.    BP 140/88 (BP Location: Left Arm, Cuff Size: Normal)   Pulse 73   Temp (!) 97.5 F (36.4 C) (Temporal)   Ht '5\' 4"'  (1.626 m)   Wt 175 lb (79.4 kg)   SpO2 97%   BMI 30.04 kg/m       Assessment & Plan:  Deanna Becker comes in today with chief complaint of Medical Management of Chronic Issues   Diagnosis and orders addressed:  1. Essential hypertension Low sodium diet - metoprolol tartrate (LOPRESSOR) 100 MG tablet; TAKE 1 TABLET BY MOUTH TWICE DAILY  Dispense: 180 tablet; Refill: 1 - lisinopril (ZESTRIL) 40 MG tablet; TAKE 1 TABLET(40 MG) BY MOUTH DAILY  Dispense: 90 tablet; Refill: 1 - hydrochlorothiazide (HYDRODIURIL) 25 MG tablet; TAKE  1 TABLET BY MOUTH EVERY DAY  Dispense: 90 tablet; Refill: 1 - cloNIDine (CATAPRES) 0.3 MG tablet; TAKE 1 TABLET BY MOUTH TWICE DAILY  Dispense: 180 tablet; Refill: 1 - CBC with Differential/Platelet - CMP14+EGFR  2. Hyperlipidemia with target LDL less than 100 Low fat diet - simvastatin (ZOCOR) 40 MG tablet; TAKE 1 TABLET(40 MG) BY MOUTH AT BEDTIME  Dispense: 90 tablet; Refill: 1 - Lipid panel  3. BMI 32.0-32.9,adult Discussed diet and exercise for person with BMI >25 Will recheck weight in 3-6 months   Labs pending Health Maintenance reviewed Diet and exercise encouraged  Follow up plan: 6 month   [place], FNP

## 2019-12-16 LAB — CMP14+EGFR
ALT: 16 IU/L (ref 0–32)
AST: 14 IU/L (ref 0–40)
Albumin/Globulin Ratio: 1.9 (ref 1.2–2.2)
Albumin: 4.5 g/dL (ref 3.7–4.7)
Alkaline Phosphatase: 84 IU/L (ref 39–117)
BUN/Creatinine Ratio: 23 (ref 12–28)
BUN: 18 mg/dL (ref 8–27)
Bilirubin Total: 1.2 mg/dL (ref 0.0–1.2)
CO2: 25 mmol/L (ref 20–29)
Calcium: 9.6 mg/dL (ref 8.7–10.3)
Chloride: 99 mmol/L (ref 96–106)
Creatinine, Ser: 0.8 mg/dL (ref 0.57–1.00)
GFR calc Af Amer: 85 mL/min/{1.73_m2} (ref >59)
GFR calc non Af Amer: 74 mL/min/{1.73_m2} (ref >59)
Globulin, Total: 2.4 g/dL (ref 1.5–4.5)
Glucose: 115 mg/dL — ABNORMAL HIGH (ref 65–99)
Potassium: 4.5 mmol/L (ref 3.5–5.2)
Sodium: 137 mmol/L (ref 134–144)
Total Protein: 6.9 g/dL (ref 6.0–8.5)

## 2019-12-16 LAB — CBC WITH DIFFERENTIAL/PLATELET
Basophils Absolute: 0.1 10*3/uL (ref 0.0–0.2)
Basos: 1 %
EOS (ABSOLUTE): 0.1 10*3/uL (ref 0.0–0.4)
Eos: 2 %
Hematocrit: 45.9 % (ref 34.0–46.6)
Hemoglobin: 14.9 g/dL (ref 11.1–15.9)
Immature Grans (Abs): 0 10*3/uL (ref 0.0–0.1)
Immature Granulocytes: 0 %
Lymphocytes Absolute: 2.2 10*3/uL (ref 0.7–3.1)
Lymphs: 28 %
MCH: 28.7 pg (ref 26.6–33.0)
MCHC: 32.5 g/dL (ref 31.5–35.7)
MCV: 88 fL (ref 79–97)
Monocytes Absolute: 0.6 10*3/uL (ref 0.1–0.9)
Monocytes: 8 %
Neutrophils Absolute: 4.7 10*3/uL (ref 1.4–7.0)
Neutrophils: 61 %
Platelets: 307 10*3/uL (ref 150–450)
RBC: 5.2 x10E6/uL (ref 3.77–5.28)
RDW: 13.4 % (ref 11.7–15.4)
WBC: 7.7 10*3/uL (ref 3.4–10.8)

## 2019-12-16 LAB — LIPID PANEL
Chol/HDL Ratio: 3 ratio (ref 0.0–4.4)
Cholesterol, Total: 175 mg/dL (ref 100–199)
HDL: 58 mg/dL (ref >39)
LDL Chol Calc (NIH): 89 mg/dL (ref 0–99)
Triglycerides: 162 mg/dL — ABNORMAL HIGH (ref 0–149)
VLDL Cholesterol Cal: 28 mg/dL (ref 5–40)

## 2020-06-07 ENCOUNTER — Ambulatory Visit: Payer: Medicare PPO | Attending: Internal Medicine

## 2020-06-07 DIAGNOSIS — Z23 Encounter for immunization: Secondary | ICD-10-CM

## 2020-06-07 NOTE — Progress Notes (Signed)
   [facility]  Name:  Deanna Becker    MRN: [id number] DOB: 1946/02/08  06/07/2020  [name] was observed post Covid-19 immunization for 15 minutes without incident. She was provided with Vaccine Information Sheet and instruction to access the V-Safe system.   [name] was instructed to call 911 with any severe reactions post vaccine: Marland Kitchen Difficulty breathing  . Swelling of face and throat  . A fast heartbeat  . A bad rash all over body  . Dizziness and weakness

## 2020-06-18 ENCOUNTER — Encounter: Payer: Self-pay | Admitting: Nurse Practitioner

## 2020-06-18 ENCOUNTER — Ambulatory Visit: Payer: Medicare PPO | Admitting: Nurse Practitioner

## 2020-06-18 ENCOUNTER — Other Ambulatory Visit: Payer: Self-pay

## 2020-06-18 VITALS — BP 143/88 | HR 75 | Temp 97.7°F | Resp 20 | Ht 64.0 in | Wt 174.0 lb

## 2020-06-18 DIAGNOSIS — Z1212 Encounter for screening for malignant neoplasm of rectum: Secondary | ICD-10-CM

## 2020-06-18 DIAGNOSIS — R739 Hyperglycemia, unspecified: Secondary | ICD-10-CM | POA: Diagnosis not present

## 2020-06-18 DIAGNOSIS — Z1211 Encounter for screening for malignant neoplasm of colon: Secondary | ICD-10-CM | POA: Diagnosis not present

## 2020-06-18 DIAGNOSIS — I10 Essential (primary) hypertension: Secondary | ICD-10-CM | POA: Diagnosis not present

## 2020-06-18 DIAGNOSIS — Z6829 Body mass index (BMI) 29.0-29.9, adult: Secondary | ICD-10-CM | POA: Diagnosis not present

## 2020-06-18 DIAGNOSIS — Z23 Encounter for immunization: Secondary | ICD-10-CM

## 2020-06-18 DIAGNOSIS — E785 Hyperlipidemia, unspecified: Secondary | ICD-10-CM | POA: Diagnosis not present

## 2020-06-18 LAB — BAYER DCA HB A1C WAIVED: HB A1C (BAYER DCA - WAIVED): 6 %

## 2020-06-18 MED ORDER — CLONIDINE HCL 0.3 MG PO TABS: ORAL_TABLET | ORAL | 1 refills | Status: DC

## 2020-06-18 MED ORDER — LISINOPRIL 40 MG PO TABS: ORAL_TABLET | ORAL | 1 refills | Status: DC

## 2020-06-18 MED ORDER — SIMVASTATIN 40 MG PO TABS: ORAL_TABLET | ORAL | 1 refills | Status: DC

## 2020-06-18 MED ORDER — METOPROLOL TARTRATE 100 MG PO TABS: ORAL_TABLET | ORAL | 1 refills | Status: DC

## 2020-06-18 MED ORDER — HYDROCHLOROTHIAZIDE 25 MG PO TABS: ORAL_TABLET | ORAL | 1 refills | Status: DC

## 2020-06-18 NOTE — Patient Instructions (Signed)
DASH Eating Plan DASH stands for "Dietary Approaches to Stop Hypertension." The DASH eating plan is a healthy eating plan that has been shown to reduce high blood pressure (hypertension). It may also reduce your risk for type 2 diabetes, heart disease, and stroke. The DASH eating plan may also help with weight loss. What are tips for following this plan?  General guidelines  Avoid eating more than 2,300 mg (milligrams) of salt (sodium) a day. If you have hypertension, you may need to reduce your sodium intake to 1,500 mg a day.  Limit alcohol intake to no more than 1 drink a day for nonpregnant women and 2 drinks a day for men. One drink equals 12 oz of beer, 5 oz of wine, or 1 oz of hard liquor.  Work with your health care provider to maintain a healthy body weight or to lose weight. Ask what an ideal weight is for you.  Get at least 30 minutes of exercise that causes your heart to beat faster (aerobic exercise) most days of the week. Activities may include walking, swimming, or biking.  Work with your health care provider or diet and nutrition specialist (dietitian) to adjust your eating plan to your individual calorie needs. Reading food labels   Check food labels for the amount of sodium per serving. Choose foods with less than 5 percent of the Daily Value of sodium. Generally, foods with less than 300 mg of sodium per serving fit into this eating plan.  To find whole grains, look for the word "whole" as the first word in the ingredient list. Shopping  Buy products labeled as "low-sodium" or "no salt added."  Buy fresh foods. Avoid canned foods and premade or frozen meals. Cooking  Avoid adding salt when cooking. Use salt-free seasonings or herbs instead of table salt or sea salt. Check with your health care provider or pharmacist before using salt substitutes.  Do not fry foods. Cook foods using healthy methods such as baking, boiling, grilling, and broiling instead.  Cook with  heart-healthy oils, such as olive, canola, soybean, or sunflower oil. Meal planning  Eat a balanced diet that includes: ? 5 or more servings of fruits and vegetables each day. At each meal, try to fill half of your plate with fruits and vegetables. ? Up to 6-8 servings of whole grains each day. ? Less than 6 oz of lean meat, poultry, or fish each day. A 3-oz serving of meat is about the same size as a deck of cards. One egg equals 1 oz. ? 2 servings of low-fat dairy each day. ? A serving of nuts, seeds, or beans 5 times each week. ? Heart-healthy fats. Healthy fats called Omega-3 fatty acids are found in foods such as flaxseeds and coldwater fish, like sardines, salmon, and mackerel.  Limit how much you eat of the following: ? Canned or prepackaged foods. ? Food that is high in trans fat, such as fried foods. ? Food that is high in saturated fat, such as fatty meat. ? Sweets, desserts, sugary drinks, and other foods with added sugar. ? Full-fat dairy products.  Do not salt foods before eating.  Try to eat at least 2 vegetarian meals each week.  Eat more home-cooked food and less restaurant, buffet, and fast food.  When eating at a restaurant, ask that your food be prepared with less salt or no salt, if possible. What foods are recommended? The items listed may not be a complete list. Talk with your dietitian about   what dietary choices are best for you. Grains Whole-grain or whole-wheat bread. Whole-grain or whole-wheat pasta. Brown rice. Oatmeal. Quinoa. Bulgur. Whole-grain and low-sodium cereals. Pita bread. Low-fat, low-sodium crackers. Whole-wheat flour tortillas. Vegetables Fresh or frozen vegetables (raw, steamed, roasted, or grilled). Low-sodium or reduced-sodium tomato and vegetable juice. Low-sodium or reduced-sodium tomato sauce and tomato paste. Low-sodium or reduced-sodium canned vegetables. Fruits All fresh, dried, or frozen fruit. Canned fruit in natural juice (without  added sugar). Meat and other protein foods Skinless chicken or turkey. Ground chicken or turkey. Pork with fat trimmed off. Fish and seafood. Egg whites. Dried beans, peas, or lentils. Unsalted nuts, nut butters, and seeds. Unsalted canned beans. Lean cuts of beef with fat trimmed off. Low-sodium, lean deli meat. Dairy Low-fat (1%) or fat-free (skim) milk. Fat-free, low-fat, or reduced-fat cheeses. Nonfat, low-sodium ricotta or cottage cheese. Low-fat or nonfat yogurt. Low-fat, low-sodium cheese. Fats and oils Soft margarine without trans fats. Vegetable oil. Low-fat, reduced-fat, or light mayonnaise and salad dressings (reduced-sodium). Canola, safflower, olive, soybean, and sunflower oils. Avocado. Seasoning and other foods Herbs. Spices. Seasoning mixes without salt. Unsalted popcorn and pretzels. Fat-free sweets. What foods are not recommended? The items listed may not be a complete list. Talk with your dietitian about what dietary choices are best for you. Grains Baked goods made with fat, such as croissants, muffins, or some breads. Dry pasta or rice meal packs. Vegetables Creamed or fried vegetables. Vegetables in a cheese sauce. Regular canned vegetables (not low-sodium or reduced-sodium). Regular canned tomato sauce and paste (not low-sodium or reduced-sodium). Regular tomato and vegetable juice (not low-sodium or reduced-sodium). Pickles. Olives. Fruits Canned fruit in a light or heavy syrup. Fried fruit. Fruit in cream or butter sauce. Meat and other protein foods Fatty cuts of meat. Ribs. Fried meat. Bacon. Sausage. Bologna and other processed lunch meats. Salami. Fatback. Hotdogs. Bratwurst. Salted nuts and seeds. Canned beans with added salt. Canned or smoked fish. Whole eggs or egg yolks. Chicken or turkey with skin. Dairy Whole or 2% milk, cream, and half-and-half. Whole or full-fat cream cheese. Whole-fat or sweetened yogurt. Full-fat cheese. Nondairy creamers. Whipped toppings.  Processed cheese and cheese spreads. Fats and oils Butter. Stick margarine. Lard. Shortening. Ghee. Bacon fat. Tropical oils, such as coconut, palm kernel, or palm oil. Seasoning and other foods Salted popcorn and pretzels. Onion salt, garlic salt, seasoned salt, table salt, and sea salt. Worcestershire sauce. Tartar sauce. Barbecue sauce. Teriyaki sauce. Soy sauce, including reduced-sodium. Steak sauce. Canned and packaged gravies. Fish sauce. Oyster sauce. Cocktail sauce. Horseradish that you find on the shelf. Ketchup. Mustard. Meat flavorings and tenderizers. Bouillon cubes. Hot sauce and Tabasco sauce. Premade or packaged marinades. Premade or packaged taco seasonings. Relishes. Regular salad dressings. Where to find more information:  National Heart, Lung, and Blood Institute: www.nhlbi.nih.gov  American Heart Association: www.heart.org Summary  The DASH eating plan is a healthy eating plan that has been shown to reduce high blood pressure (hypertension). It may also reduce your risk for type 2 diabetes, heart disease, and stroke.  With the DASH eating plan, you should limit salt (sodium) intake to 2,300 mg a day. If you have hypertension, you may need to reduce your sodium intake to 1,500 mg a day.  When on the DASH eating plan, aim to eat more fresh fruits and vegetables, whole grains, lean proteins, low-fat dairy, and heart-healthy fats.  Work with your health care provider or diet and nutrition specialist (dietitian) to adjust your eating plan to your   individual calorie needs. This information is not intended to replace advice given to you by your health care provider. Make sure you discuss any questions you have with your health care provider. Document Revised: 07/10/2017 Document Reviewed: 07/21/2016 Elsevier Patient Education  2020 Elsevier Inc.  

## 2020-06-18 NOTE — Progress Notes (Addendum)
Subjective:    Patient ID: Deanna Becker, female    DOB: 1946-01-18, 74 y.o.   MRN: [id number]   Chief Complaint: medical management of chronic issues     HPI:  1. Primary hypertension No c/o chest pain, sob or headache. Does not check blood pressure at home. Has white coat syndrome. BP Readings from Last 3 Encounters:  12/15/19 140/88  06/07/19 136/82  10/28/18 (!) 143/75     2. Hyperlipidemia with target LDL less than 100 Does try to watch diet and has been walking every morning with some fellow  church members Lab Results  Component Value Date   CHOL 175 12/15/2019   HDL 58 12/15/2019   LDLCALC 89 12/15/2019   TRIG 162 (H) 12/15/2019   CHOLHDL 3.0 12/15/2019     3. BMI 29.0-29.9,adult No recent weight gains Wt Readings from Last 3 Encounters:  06/18/20 174 lb (78.9 kg)  12/15/19 175 lb (79.4 kg)  06/07/19 173 lb (78.5 kg)   BMI Readings from Last 3 Encounters:  06/18/20 29.87 kg/m  12/15/19 30.04 kg/m  06/07/19 29.70 kg/m      Outpatient Encounter Medications as of 06/18/2020  Medication Sig  . aspirin EC 81 MG tablet Take 1 tablet (81 mg total) by mouth daily.  . cloNIDine (CATAPRES) 0.3 MG tablet TAKE 1 TABLET BY MOUTH TWICE DAILY  . hydrochlorothiazide (HYDRODIURIL) 25 MG tablet TAKE 1 TABLET BY MOUTH EVERY DAY  . lisinopril (ZESTRIL) 40 MG tablet TAKE 1 TABLET(40 MG) BY MOUTH DAILY  . metoprolol tartrate (LOPRESSOR) 100 MG tablet TAKE 1 TABLET BY MOUTH TWICE DAILY  . simvastatin (ZOCOR) 40 MG tablet TAKE 1 TABLET(40 MG) BY MOUTH AT BEDTIME     Past Surgical History:  Procedure Laterality Date  . gangloin cyst      Family History  Problem Relation Age of Onset  . Asthma Mother   . Heart disease Father     New complaints: None today  Social history: Lives with her husband- retired  Controlled substance contract: n/a    Review of Systems  Constitutional: Negative for diaphoresis.  Eyes: Negative for pain.  Respiratory:  Negative for shortness of breath.   Cardiovascular: Negative for chest pain, palpitations and leg swelling.  Gastrointestinal: Negative for abdominal pain.  Endocrine: Negative for polydipsia.  Skin: Negative for rash.  Neurological: Negative for dizziness, weakness and headaches.  Hematological: Does not bruise/bleed easily.  All other systems reviewed and are negative.      Objective:   Physical Exam Vitals and nursing note reviewed.  Constitutional:      General: She is not in acute distress.    Appearance: Normal appearance. She is well-developed.  HENT:     Head: Normocephalic.     Nose: Nose normal.  Eyes:     Pupils: Pupils are equal, round, and reactive to light.  Neck:     Vascular: No carotid bruit or JVD.  Cardiovascular:     Rate and Rhythm: Normal rate and regular rhythm.     Heart sounds: Murmur (3/6 systolic murmur) heard.   Pulmonary:     Effort: Pulmonary effort is normal. No respiratory distress.     Breath sounds: Normal breath sounds. No wheezing or rales.  Chest:     Chest wall: No tenderness.  Abdominal:     General: Bowel sounds are normal. There is no distension or abdominal bruit.     Palpations: Abdomen is soft. There is no hepatomegaly, splenomegaly, mass or pulsatile  mass.     Tenderness: There is no abdominal tenderness.  Musculoskeletal:        General: Normal range of motion.     Cervical back: Normal range of motion and neck supple.  Lymphadenopathy:     Cervical: No cervical adenopathy.  Skin:    General: Skin is warm and dry.  Neurological:     Mental Status: She is alert and oriented to person, place, and time.     Deep Tendon Reflexes: Reflexes are normal and symmetric.  Psychiatric:        Behavior: Behavior normal.        Thought Content: Thought content normal.        Judgment: Judgment normal.     BP (!) 143/88 Comment: self reported  Pulse 75   Temp 97.7 F (36.5 C) (Temporal)   Resp 20   Ht _0  (1.626 m)   Wt 174  lb (78.9 kg)   SpO2 100%   BMI 29.87 kg/m   HGBA1c 6.0      Assessment & Plan:  Martena Emanuele comes in today with chief complaint of Medical Management of Chronic Issues   Diagnosis and orders addressed:  1. Primary hypertension Low sodium diet Keep diary of blood pressure at home. - CBC with Differential/Platelet - CMP14+EGFR - metoprolol tartrate (LOPRESSOR) 100 MG tablet; TAKE 1 TABLET BY MOUTH TWICE DAILY  Dispense: 180 tablet; Refill: 1 - lisinopril (ZESTRIL) 40 MG tablet; TAKE 1 TABLET(40 MG) BY MOUTH DAILY  Dispense: 90 tablet; Refill: 1 - hydrochlorothiazide (HYDRODIURIL) 25 MG tablet; TAKE 1 TABLET BY MOUTH EVERY DAY  Dispense: 90 tablet; Refill: 1 - cloNIDine (CATAPRES) 0.3 MG tablet; TAKE 1 TABLET BY MOUTH TWICE DAILY  Dispense: 180 tablet; Refill: 1  2. Hyperlipidemia with target LDL less than 100 Low fat diet - Lipid panel - simvastatin (ZOCOR) 40 MG tablet; TAKE 1 TABLET(40 MG) BY MOUTH AT BEDTIME  Dispense: 90 tablet; Refill: 1  3. BMI 29.0-29.9,adult Discussed diet and exercise for person with BMI >25 Will recheck weight in 3-6 months  4. Hyperglycemia Watch carbs in diet - Bayer DCA Hb A1c Waived  5. Encounter for screening for colorectal malignant neoplasm - Cologuard   Labs pending Health Maintenance reviewed Diet and exercise encouraged  Follow up plan: 3 months   Mary-Margaret Hassell Done, FNP

## 2020-06-19 LAB — CBC WITH DIFFERENTIAL/PLATELET
Basophils Absolute: 0.1 10*3/uL (ref 0.0–0.2)
Basos: 1 %
EOS (ABSOLUTE): 0.1 10*3/uL (ref 0.0–0.4)
Eos: 2 %
Hematocrit: 42.9 % (ref 34.0–46.6)
Hemoglobin: 14.4 g/dL (ref 11.1–15.9)
Immature Grans (Abs): 0 10*3/uL (ref 0.0–0.1)
Immature Granulocytes: 0 %
Lymphocytes Absolute: 2.1 10*3/uL (ref 0.7–3.1)
Lymphs: 30 %
MCH: 29.8 pg (ref 26.6–33.0)
MCHC: 33.6 g/dL (ref 31.5–35.7)
MCV: 89 fL (ref 79–97)
Monocytes Absolute: 0.4 10*3/uL (ref 0.1–0.9)
Monocytes: 6 %
Neutrophils Absolute: 4.3 10*3/uL (ref 1.4–7.0)
Neutrophils: 61 %
Platelets: 308 10*3/uL (ref 150–450)
RBC: 4.83 x10E6/uL (ref 3.77–5.28)
RDW: 13 % (ref 11.7–15.4)
WBC: 7 10*3/uL (ref 3.4–10.8)

## 2020-06-19 LAB — CMP14+EGFR
ALT: 16 IU/L (ref 0–32)
AST: 14 IU/L (ref 0–40)
Albumin/Globulin Ratio: 1.5 (ref 1.2–2.2)
Albumin: 4.3 g/dL (ref 3.7–4.7)
Alkaline Phosphatase: 94 IU/L (ref 44–121)
BUN/Creatinine Ratio: 22 (ref 12–28)
BUN: 17 mg/dL (ref 8–27)
Bilirubin Total: 0.9 mg/dL (ref 0.0–1.2)
CO2: 24 mmol/L (ref 20–29)
Calcium: 9.3 mg/dL (ref 8.7–10.3)
Chloride: 99 mmol/L (ref 96–106)
Creatinine, Ser: 0.78 mg/dL (ref 0.57–1.00)
GFR calc Af Amer: 87 mL/min/{1.73_m2} (ref >59)
GFR calc non Af Amer: 76 mL/min/{1.73_m2} (ref >59)
Globulin, Total: 2.8 g/dL (ref 1.5–4.5)
Glucose: 132 mg/dL — ABNORMAL HIGH (ref 65–99)
Potassium: 4.3 mmol/L (ref 3.5–5.2)
Sodium: 138 mmol/L (ref 134–144)
Total Protein: 7.1 g/dL (ref 6.0–8.5)

## 2020-06-19 LAB — LIPID PANEL
Chol/HDL Ratio: 3.3 ratio (ref 0.0–4.4)
Cholesterol, Total: 184 mg/dL (ref 100–199)
HDL: 55 mg/dL (ref >39)
LDL Chol Calc (NIH): 98 mg/dL (ref 0–99)
Triglycerides: 178 mg/dL — ABNORMAL HIGH (ref 0–149)
VLDL Cholesterol Cal: 31 mg/dL (ref 5–40)

## 2020-07-03 DIAGNOSIS — Z1212 Encounter for screening for malignant neoplasm of rectum: Secondary | ICD-10-CM | POA: Diagnosis not present

## 2020-07-13 LAB — COLOGUARD: COLOGUARD: NEGATIVE

## 2020-07-18 LAB — COLOGUARD: Cologuard: NEGATIVE

## 2020-09-25 ENCOUNTER — Ambulatory Visit: Payer: Self-pay | Admitting: Nurse Practitioner

## 2020-10-16 ENCOUNTER — Encounter: Payer: Self-pay | Admitting: Nurse Practitioner

## 2020-10-16 ENCOUNTER — Other Ambulatory Visit: Payer: Self-pay

## 2020-10-16 ENCOUNTER — Ambulatory Visit: Payer: Medicare PPO | Admitting: Nurse Practitioner

## 2020-10-16 VITALS — BP 160/85 | HR 72 | Temp 97.9°F | Resp 20 | Ht 64.0 in | Wt 171.0 lb

## 2020-10-16 DIAGNOSIS — I10 Essential (primary) hypertension: Secondary | ICD-10-CM | POA: Diagnosis not present

## 2020-10-16 DIAGNOSIS — E785 Hyperlipidemia, unspecified: Secondary | ICD-10-CM | POA: Diagnosis not present

## 2020-10-16 DIAGNOSIS — Z6832 Body mass index (BMI) 32.0-32.9, adult: Secondary | ICD-10-CM | POA: Diagnosis not present

## 2020-10-16 LAB — CBC WITH DIFFERENTIAL/PLATELET
Basophils Absolute: 0.1 10*3/uL (ref 0.0–0.2)
Basos: 1 %
EOS (ABSOLUTE): 0.1 10*3/uL (ref 0.0–0.4)
Eos: 1 %
Hematocrit: 47.1 % — ABNORMAL HIGH (ref 34.0–46.6)
Hemoglobin: 15.2 g/dL (ref 11.1–15.9)
Immature Grans (Abs): 0 10*3/uL (ref 0.0–0.1)
Immature Granulocytes: 1 %
Lymphocytes Absolute: 2.3 10*3/uL (ref 0.7–3.1)
Lymphs: 29 %
MCH: 28.3 pg (ref 26.6–33.0)
MCHC: 32.3 g/dL (ref 31.5–35.7)
MCV: 88 fL (ref 79–97)
Monocytes Absolute: 0.5 10*3/uL (ref 0.1–0.9)
Monocytes: 6 %
Neutrophils Absolute: 5 10*3/uL (ref 1.4–7.0)
Neutrophils: 62 %
Platelets: 332 10*3/uL (ref 150–450)
RBC: 5.37 x10E6/uL — ABNORMAL HIGH (ref 3.77–5.28)
RDW: 13.3 % (ref 11.7–15.4)
WBC: 8.1 10*3/uL (ref 3.4–10.8)

## 2020-10-16 LAB — LIPID PANEL
Chol/HDL Ratio: 3.1 ratio (ref 0.0–4.4)
Cholesterol, Total: 168 mg/dL (ref 100–199)
HDL: 55 mg/dL (ref >39)
LDL Chol Calc (NIH): 91 mg/dL (ref 0–99)
Triglycerides: 123 mg/dL (ref 0–149)
VLDL Cholesterol Cal: 22 mg/dL (ref 5–40)

## 2020-10-16 LAB — CMP14+EGFR
ALT: 16 IU/L (ref 0–32)
AST: 18 IU/L (ref 0–40)
Albumin/Globulin Ratio: 1.7 (ref 1.2–2.2)
Albumin: 4.7 g/dL (ref 3.7–4.7)
Alkaline Phosphatase: 88 IU/L (ref 44–121)
BUN/Creatinine Ratio: 19 (ref 12–28)
BUN: 14 mg/dL (ref 8–27)
Bilirubin Total: 1.3 mg/dL — ABNORMAL HIGH (ref 0.0–1.2)
CO2: 24 mmol/L (ref 20–29)
Calcium: 9.7 mg/dL (ref 8.7–10.3)
Chloride: 99 mmol/L (ref 96–106)
Creatinine, Ser: 0.74 mg/dL (ref 0.57–1.00)
Globulin, Total: 2.8 g/dL (ref 1.5–4.5)
Glucose: 111 mg/dL — ABNORMAL HIGH (ref 65–99)
Potassium: 4.6 mmol/L (ref 3.5–5.2)
Sodium: 139 mmol/L (ref 134–144)
Total Protein: 7.5 g/dL (ref 6.0–8.5)
eGFR: 85 mL/min/{1.73_m2} (ref >59)

## 2020-10-16 MED ORDER — SIMVASTATIN 40 MG PO TABS: ORAL_TABLET | ORAL | 1 refills | Status: DC

## 2020-10-16 MED ORDER — LISINOPRIL 40 MG PO TABS: ORAL_TABLET | ORAL | 1 refills | Status: DC

## 2020-10-16 MED ORDER — METOPROLOL TARTRATE 100 MG PO TABS: ORAL_TABLET | ORAL | 1 refills | Status: DC

## 2020-10-16 MED ORDER — CLONIDINE HCL 0.3 MG PO TABS: ORAL_TABLET | ORAL | 1 refills | Status: DC

## 2020-10-16 MED ORDER — HYDROCHLOROTHIAZIDE 25 MG PO TABS: ORAL_TABLET | ORAL | 1 refills | Status: DC

## 2020-10-16 NOTE — Progress Notes (Addendum)
Subjective:    Patient ID: Deanna Becker, female    DOB: March 12, 1946, 75 y.o.   MRN: [id number]   Chief Complaint: medical management of chronic issues     HPI:  1. Primary hypertension No c/o chest pain, sob or headache. Does not check blood pressure at home. She says she is very anxious when she comes to the doctor. BP Readings from Last 3 Encounters:  10/16/20 (!) 160/85  06/18/20 (!) 143/88  12/15/19 140/88      2. Hyperlipidemia with target LDL less than 100 Doe snot watch diet and doe sno dedicated exercise. Lab Results  Component Value Date   CHOL 184 06/18/2020   HDL 55 06/18/2020   LDLCALC 98 06/18/2020   TRIG 178 (H) 06/18/2020   CHOLHDL 3.3 06/18/2020   The 10-year ASCVD risk score Denman George DC Jr., et al., 2013) is: 20.7%   3. BMI 32.0-32.9,adult Weight is down 3 lbs Wt Readings from Last 3 Encounters:  10/16/20 171 lb (77.6 kg)  06/18/20 174 lb (78.9 kg)  12/15/19 175 lb (79.4 kg)   BMI Readings from Last 3 Encounters:  10/16/20 29.35 kg/m  06/18/20 29.87 kg/m  12/15/19 30.04 kg/m       Outpatient Encounter Medications as of 10/16/2020  Medication Sig  . aspirin EC 81 MG tablet Take 1 tablet (81 mg total) by mouth daily.  . cloNIDine (CATAPRES) 0.3 MG tablet TAKE 1 TABLET BY MOUTH TWICE DAILY  . hydrochlorothiazide (HYDRODIURIL) 25 MG tablet TAKE 1 TABLET BY MOUTH EVERY DAY  . lisinopril (ZESTRIL) 40 MG tablet TAKE 1 TABLET(40 MG) BY MOUTH DAILY  . metoprolol tartrate (LOPRESSOR) 100 MG tablet TAKE 1 TABLET BY MOUTH TWICE DAILY  . simvastatin (ZOCOR) 40 MG tablet TAKE 1 TABLET(40 MG) BY MOUTH AT BEDTIME     Past Surgical History:  Procedure Laterality Date  . gangloin cyst      Family History  Problem Relation Age of Onset  . Asthma Mother   . Heart disease Father     New complaints: None today  Social history: Lives he rhusband  Controlled substance contract: n/a    Review of Systems  Constitutional: Negative for  diaphoresis.  Eyes: Negative for pain.  Respiratory: Negative for shortness of breath.   Cardiovascular: Negative for chest pain, palpitations and leg swelling.  Gastrointestinal: Negative for abdominal pain.  Endocrine: Negative for polydipsia.  Skin: Negative for rash.  Neurological: Negative for dizziness, weakness and headaches.  Hematological: Does not bruise/bleed easily.  All other systems reviewed and are negative.      Objective:   Physical Exam Vitals and nursing note reviewed.  Constitutional:      General: She is not in acute distress.    Appearance: Normal appearance. She is well-developed and well-nourished.  HENT:     Head: Normocephalic.     Nose: Nose normal.     Mouth/Throat:     Mouth: Oropharynx is clear and moist.  Eyes:     Extraocular Movements: EOM normal.     Pupils: Pupils are equal, round, and reactive to light.  Neck:     Vascular: No carotid bruit or JVD.  Cardiovascular:     Rate and Rhythm: Normal rate and regular rhythm.     Pulses: Intact distal pulses.     Heart sounds: Murmur (3/6) heard.    Pulmonary:     Effort: Pulmonary effort is normal. No respiratory distress.     Breath sounds: Normal breath sounds. No  wheezing or rales.  Chest:     Chest wall: No tenderness.  Abdominal:     General: Bowel sounds are normal. There is no distension or abdominal bruit. Aorta is normal.     Palpations: Abdomen is soft. There is no hepatomegaly, splenomegaly, mass or pulsatile mass.     Tenderness: There is no abdominal tenderness.  Musculoskeletal:        General: No edema. Normal range of motion.     Cervical back: Normal range of motion and neck supple.  Lymphadenopathy:     Cervical: No cervical adenopathy.  Skin:    General: Skin is warm and dry.  Neurological:     Mental Status: She is alert and oriented to person, place, and time.     Deep Tendon Reflexes: Reflexes are normal and symmetric.  Psychiatric:        Mood and Affect: Mood  and affect normal.        Behavior: Behavior normal.        Thought Content: Thought content normal.        Judgment: Judgment normal.    BP (!) 160/85   Pulse 72   Temp 97.9 F (36.6 C) (Temporal)   Resp 20   Ht 5\' 4"  (1.626 m)   Wt 171 lb (77.6 kg)   BMI 29.35 kg/m         Assessment & Plan:  Deanna Becker comes in today with chief complaint of Medical Management of Chronic Issues   Diagnosis and orders addressed:  1. Primary hypertension low sodium diet Keep diary of blood pressure at home- report if staying above 140 systolic. - metoprolol tartrate (LOPRESSOR) 100 MG tablet; TAKE 1 TABLET BY MOUTH TWICE DAILY  Dispense: 180 tablet; Refill: 1 - lisinopril (ZESTRIL) 40 MG tablet; TAKE 1 TABLET(40 MG) BY MOUTH DAILY  Dispense: 90 tablet; Refill: 1 - hydrochlorothiazide (HYDRODIURIL) 25 MG tablet; TAKE 1 TABLET BY MOUTH EVERY DAY  Dispense: 90 tablet; Refill: 1 - cloNIDine (CATAPRES) 0.3 MG tablet; TAKE 1 TABLET BY MOUTH TWICE DAILY  Dispense: 180 tablet; Refill: 1  2. Hyperlipidemia with target LDL less than 100 Low fat diet - simvastatin (ZOCOR) 40 MG tablet; TAKE 1 TABLET(40 MG) BY MOUTH AT BEDTIME  Dispense: 90 tablet; Refill: 1  3. BMI 32.0-32.9,adult Discussed diet and exercise for person with BMI >25 Will recheck weight in 3-6 months   Labs pending Health Maintenance reviewed Diet and exercise encouraged  Follow up plan: 6 months   Mary-Margaret 12-31-1994, FNP

## 2020-10-16 NOTE — Addendum Note (Signed)
Addended by: Bennie Pierini on: 10/16/2020 11:12 AM   Modules accepted: Orders

## 2020-10-16 NOTE — Patient Instructions (Signed)

## 2021-04-10 ENCOUNTER — Encounter: Payer: Self-pay | Admitting: Nurse Practitioner

## 2021-04-10 ENCOUNTER — Ambulatory Visit: Payer: Medicare PPO | Admitting: Nurse Practitioner

## 2021-04-10 VITALS — Temp 99.4°F

## 2021-04-10 DIAGNOSIS — U071 COVID-19: Secondary | ICD-10-CM | POA: Insufficient documentation

## 2021-04-10 MED ORDER — NIRMATRELVIR/RITONAVIR (PAXLOVID)TABLET: 3.0000 | ORAL_TABLET | Freq: Two times a day (BID) | ORAL | 0 refills | Status: AC

## 2021-04-10 NOTE — Assessment & Plan Note (Signed)
Patient has home COVID-19 test positive with symptoms of decreased appetite, cough, chills, low-grade fever of 99, and nausea.  Education provided to patient to increase hydration, monitor oxygen saturation, Rx for paxlovid sent to patient's pharmacy.  Education provided patient verbalized understanding.  Patient will return call if pharmacy does not have medication.  Patient is not at this time willing to drive 30 minutes to [facility] pharmacy to pick up medication.

## 2021-04-10 NOTE — Progress Notes (Signed)
   Virtual Visit  Note Due to COVID-19 pandemic this visit was conducted virtually. This visit type was conducted due to national recommendations for restrictions regarding the COVID-19 Pandemic (e.g. social distancing, sheltering in place) in an effort to limit this patient's exposure and mitigate transmission in our community. All issues noted in this document were discussed and addressed.  A physical exam was not performed with this format.  I connected with Deanna Becker on 04/10/21 at 9:30 am  by telephone and verified that I am speaking with the correct person using two identifiers. Deanna Becker is currently located at home during visit. The provider, Daryll Drown, NP is located in their office at time of visit.  I discussed the limitations, risks, security and privacy concerns of performing an evaluation and management service by telephone and the availability of in person appointments. I also discussed with the patient that there may be a patient responsible charge related to this service. The patient expressed understanding and agreed to proceed.   History and Present Illness:  HPI   Patient at home COVID-19 test positive in the last 24 hours.  Patient is experiencing decreased appetite, cough, chills, low-grade fever of 99.  Nausea in the last 3 days.  Patient has used nothing for symptom management.  No loss of taste or vomiting associated with current symptoms.   Review of Systems  Constitutional:  Positive for chills, fever and malaise/fatigue.  HENT: Negative.    Respiratory:  Negative for shortness of breath.   Cardiovascular: Negative.   Skin:  Negative for rash.  Neurological: Negative.     Observations/Objective: Televisit patient not in distress.  Assessment and Plan: Patient has home COVID-19 test positive with symptoms of decreased appetite, cough, chills, low-grade fever of 99, and nausea.  Education provided to patient to increase hydration, monitor oxygen  saturation, Rx for paxlovid sent to patient's pharmacy.  Education provided patient verbalized understanding.  Patient will return call if pharmacy does not have medication.  Patient is not at this time willing to drive 30 minutes to [facility] pharmacy to pick up medication.  Follow Up Instructions: Follow-up with worsening unresolved symptoms.    I discussed the assessment and treatment plan with the patient. The patient was provided an opportunity to ask questions and all were answered. The patient agreed with the plan and demonstrated an understanding of the instructions.   The patient was advised to call back or seek an in-person evaluation if the symptoms worsen or if the condition fails to improve as anticipated.  The above assessment and management plan was discussed with the patient. The patient verbalized understanding of and has agreed to the management plan. Patient is aware to call the clinic if symptoms persist or worsen. Patient is aware when to return to the clinic for a follow-up visit. Patient educated on when it is appropriate to go to the emergency department.   Time call ended:   9:40 AM I provided 10 minutes of  non face-to-face time during this encounter.    Daryll Drown, NP

## 2021-04-18 ENCOUNTER — Ambulatory Visit: Payer: Self-pay | Admitting: Nurse Practitioner

## 2021-04-22 NOTE — Progress Notes (Signed)
Subjective:    Patient ID: Deanna Becker, female    DOB: 09-18-1945, 75 y.o.   MRN: [id number]   Chief Complaint: medical management of chronic issues      HPI:  1. Primary hypertension No c/o chest pain, sob or headache. Does not check blood pressure at home. BP Readings from Last 3 Encounters:  04/29/21 (!) 161/82  10/16/20 (!) 160/85  06/18/20 (!) 143/88       2. Hyperlipidemia with target LDL less than 100 Does not watch diet and does no dedicated exercise. Statins cause myalgia Lab Results  Component Value Date   CHOL 168 10/16/2020   HDL 55 10/16/2020   LDLCALC 91 10/16/2020   TRIG 123 10/16/2020   CHOLHDL 3.1 10/16/2020     3. BMI 32.0-32.9,adult BMI Readings from Last 3 Encounters:  04/29/21 29.01 kg/m  10/16/20 29.35 kg/m  06/18/20 29.87 kg/m   Wt Readings from Last 3 Encounters:  04/29/21 169 lb (76.7 kg)  10/16/20 171 lb (77.6 kg)  06/18/20 174 lb (78.9 kg)       Outpatient Encounter Medications as of 04/29/2021  Medication Sig   aspirin EC 81 MG tablet Take 1 tablet (81 mg total) by mouth daily.   cloNIDine (CATAPRES) 0.3 MG tablet TAKE 1 TABLET BY MOUTH TWICE DAILY   hydrochlorothiazide (HYDRODIURIL) 25 MG tablet TAKE 1 TABLET BY MOUTH EVERY DAY   lisinopril (ZESTRIL) 40 MG tablet TAKE 1 TABLET(40 MG) BY MOUTH DAILY   metoprolol tartrate (LOPRESSOR) 100 MG tablet TAKE 1 TABLET BY MOUTH TWICE DAILY   Nutritional Supplements (VITAMIN D BOOSTER PO) Take by mouth.   ZINC OXIDE PO Take by mouth.   No facility-administered encounter medications on file as of 04/29/2021.    Past Surgical History:  Procedure Laterality Date   gangloin cyst      Family History  Problem Relation Age of Onset   Asthma Mother    Heart disease Father     New complaints: None today  Social history: Lives with her husband  Controlled substance contract: n/a     Review of Systems  Constitutional:  Negative for diaphoresis.  Eyes:  Negative for  pain.  Respiratory:  Negative for shortness of breath.   Cardiovascular:  Negative for chest pain, palpitations and leg swelling.  Gastrointestinal:  Negative for abdominal pain.  Endocrine: Negative for polydipsia.  Skin:  Negative for rash.  Neurological:  Negative for dizziness, weakness and headaches.  Hematological:  Does not bruise/bleed easily.  All other systems reviewed and are negative.     Objective:   Physical Exam Vitals and nursing note reviewed.  Constitutional:      General: She is not in acute distress.    Appearance: Normal appearance. She is well-developed.  HENT:     Head: Normocephalic.     Right Ear: Tympanic membrane normal.     Left Ear: Tympanic membrane normal.     Nose: Nose normal.     Mouth/Throat:     Mouth: Mucous membranes are moist.  Eyes:     Pupils: Pupils are equal, round, and reactive to light.  Neck:     Vascular: No carotid bruit or JVD.  Cardiovascular:     Rate and Rhythm: Normal rate and regular rhythm.     Heart sounds: Normal heart sounds.  Pulmonary:     Effort: Pulmonary effort is normal. No respiratory distress.     Breath sounds: Normal breath sounds. No wheezing or rales.  Chest:  Chest wall: No tenderness.  Abdominal:     General: Bowel sounds are normal. There is no distension or abdominal bruit.     Palpations: Abdomen is soft. There is no hepatomegaly, splenomegaly, mass or pulsatile mass.     Tenderness: There is no abdominal tenderness.  Musculoskeletal:        General: Normal range of motion.     Cervical back: Normal range of motion and neck supple.  Lymphadenopathy:     Cervical: No cervical adenopathy.  Skin:    General: Skin is warm and dry.  Neurological:     Mental Status: She is alert and oriented to person, place, and time.     Deep Tendon Reflexes: Reflexes are normal and symmetric.  Psychiatric:        Behavior: Behavior normal.        Thought Content: Thought content normal.        Judgment:  Judgment normal.    BP (!) 148/76   Pulse 72   Temp (!) 97.5 F (36.4 C) (Temporal)   Resp 20   Ht '5\' 4"'  (1.626 m)   Wt 169 lb (76.7 kg)   SpO2 100%   BMI 29.01 kg/m      Assessment & Plan:   Quadasia Newsham comes in today with chief complaint of Medical Management of Chronic Issues   Diagnosis and orders addressed:  1. Primary hypertension Low sodium diet Keep diary of blood pressure at home. - metoprolol tartrate (LOPRESSOR) 100 MG tablet; TAKE 1 TABLET BY MOUTH TWICE DAILY  Dispense: 180 tablet; Refill: 1 - lisinopril (ZESTRIL) 40 MG tablet; TAKE 1 TABLET(40 MG) BY MOUTH DAILY  Dispense: 90 tablet; Refill: 1 - hydrochlorothiazide (HYDRODIURIL) 25 MG tablet; TAKE 1 TABLET BY MOUTH EVERY DAY  Dispense: 90 tablet; Refill: 1 - cloNIDine (CATAPRES) 0.3 MG tablet; TAKE 1 TABLET BY MOUTH TWICE DAILY  Dispense: 180 tablet; Refill: 1  2. Hyperlipidemia with target LDL less than 100 Low fat diet and exercise  3. BMI 32.0-32.9,adult Discussed diet and exercise for person with BMI >25 Will recheck weight in 3-6 months  Orders Placed This Encounter  Procedures   CBC with Differential/Platelet   CMP14+EGFR   Lipid panel       Labs pending Health Maintenance reviewed Diet and exercise encouraged  Follow up plan: 3 months   [place], FNP

## 2021-04-29 ENCOUNTER — Other Ambulatory Visit: Payer: Self-pay

## 2021-04-29 ENCOUNTER — Ambulatory Visit: Payer: Medicare PPO | Admitting: Nurse Practitioner

## 2021-04-29 ENCOUNTER — Encounter: Payer: Self-pay | Admitting: Nurse Practitioner

## 2021-04-29 VITALS — BP 148/76 | HR 72 | Temp 97.5°F | Resp 20 | Ht 64.0 in | Wt 169.0 lb

## 2021-04-29 DIAGNOSIS — Z6832 Body mass index (BMI) 32.0-32.9, adult: Secondary | ICD-10-CM

## 2021-04-29 DIAGNOSIS — I10 Essential (primary) hypertension: Secondary | ICD-10-CM | POA: Diagnosis not present

## 2021-04-29 DIAGNOSIS — E785 Hyperlipidemia, unspecified: Secondary | ICD-10-CM | POA: Diagnosis not present

## 2021-04-29 DIAGNOSIS — Z23 Encounter for immunization: Secondary | ICD-10-CM | POA: Diagnosis not present

## 2021-04-29 MED ORDER — CLONIDINE HCL 0.3 MG PO TABS: ORAL_TABLET | ORAL | 1 refills | Status: DC

## 2021-04-29 MED ORDER — LISINOPRIL 40 MG PO TABS: ORAL_TABLET | ORAL | 1 refills | Status: DC

## 2021-04-29 MED ORDER — METOPROLOL TARTRATE 100 MG PO TABS: ORAL_TABLET | ORAL | 1 refills | Status: DC

## 2021-04-29 MED ORDER — HYDROCHLOROTHIAZIDE 25 MG PO TABS: ORAL_TABLET | ORAL | 1 refills | Status: DC

## 2021-04-29 NOTE — Patient Instructions (Signed)

## 2021-04-30 LAB — CMP14+EGFR
ALT: 16 IU/L (ref 0–32)
AST: 16 IU/L (ref 0–40)
Albumin/Globulin Ratio: 2.3 — ABNORMAL HIGH (ref 1.2–2.2)
Albumin: 4.8 g/dL — ABNORMAL HIGH (ref 3.7–4.7)
Alkaline Phosphatase: 84 IU/L (ref 44–121)
BUN/Creatinine Ratio: 20 (ref 12–28)
BUN: 14 mg/dL (ref 8–27)
Bilirubin Total: 1.1 mg/dL (ref 0.0–1.2)
CO2: 24 mmol/L (ref 20–29)
Calcium: 9.7 mg/dL (ref 8.7–10.3)
Chloride: 101 mmol/L (ref 96–106)
Creatinine, Ser: 0.7 mg/dL (ref 0.57–1.00)
Globulin, Total: 2.1 g/dL (ref 1.5–4.5)
Glucose: 116 mg/dL — ABNORMAL HIGH (ref 65–99)
Potassium: 4.5 mmol/L (ref 3.5–5.2)
Sodium: 141 mmol/L (ref 134–144)
Total Protein: 6.9 g/dL (ref 6.0–8.5)
eGFR: 91 mL/min/{1.73_m2} (ref >59)

## 2021-04-30 LAB — CBC WITH DIFFERENTIAL/PLATELET
Basophils Absolute: 0.1 10*3/uL (ref 0.0–0.2)
Basos: 1 %
EOS (ABSOLUTE): 0.1 10*3/uL (ref 0.0–0.4)
Eos: 1 %
Hematocrit: 46.8 % — ABNORMAL HIGH (ref 34.0–46.6)
Hemoglobin: 15.2 g/dL (ref 11.1–15.9)
Immature Grans (Abs): 0 10*3/uL (ref 0.0–0.1)
Immature Granulocytes: 0 %
Lymphocytes Absolute: 1.8 10*3/uL (ref 0.7–3.1)
Lymphs: 21 %
MCH: 28.6 pg (ref 26.6–33.0)
MCHC: 32.5 g/dL (ref 31.5–35.7)
MCV: 88 fL (ref 79–97)
Monocytes Absolute: 0.6 10*3/uL (ref 0.1–0.9)
Monocytes: 7 %
Neutrophils Absolute: 6 10*3/uL (ref 1.4–7.0)
Neutrophils: 70 %
Platelets: 308 10*3/uL (ref 150–450)
RBC: 5.32 x10E6/uL — ABNORMAL HIGH (ref 3.77–5.28)
RDW: 13.2 % (ref 11.7–15.4)
WBC: 8.5 10*3/uL (ref 3.4–10.8)

## 2021-04-30 LAB — LIPID PANEL
Chol/HDL Ratio: 3 ratio (ref 0.0–4.4)
Cholesterol, Total: 165 mg/dL (ref 100–199)
HDL: 55 mg/dL (ref >39)
LDL Chol Calc (NIH): 82 mg/dL (ref 0–99)
Triglycerides: 162 mg/dL — ABNORMAL HIGH (ref 0–149)
VLDL Cholesterol Cal: 28 mg/dL (ref 5–40)

## 2021-06-13 ENCOUNTER — Other Ambulatory Visit: Payer: Self-pay | Admitting: Nurse Practitioner

## 2021-06-13 DIAGNOSIS — E785 Hyperlipidemia, unspecified: Secondary | ICD-10-CM

## 2021-07-01 ENCOUNTER — Other Ambulatory Visit: Payer: Self-pay | Admitting: Nurse Practitioner

## 2021-07-01 DIAGNOSIS — E785 Hyperlipidemia, unspecified: Secondary | ICD-10-CM

## 2021-07-02 NOTE — Telephone Encounter (Signed)
Refill requested but isn't currently on rx list  Ok to refill?

## 2021-07-29 ENCOUNTER — Ambulatory Visit: Payer: Medicare PPO | Admitting: Nurse Practitioner

## 2021-07-31 ENCOUNTER — Ambulatory Visit (INDEPENDENT_AMBULATORY_CARE_PROVIDER_SITE_OTHER): Payer: Medicare PPO

## 2021-07-31 VITALS — Ht 64.0 in | Wt 169.0 lb

## 2021-07-31 DIAGNOSIS — Z0000 Encounter for general adult medical examination without abnormal findings: Secondary | ICD-10-CM

## 2021-07-31 NOTE — Patient Instructions (Signed)
Deanna Becker , Thank you for taking time to come for your Medicare Wellness Visit. I appreciate your ongoing commitment to your health goals. Please review the following plan we discussed and let me know if I can assist you in the future.   Screening recommendations/referrals: Colonoscopy: Cologuard done 07/03/2020 Repeat in 3 years. Mammogram: Declined. Bone Density: Done 06/17/2017.  Recommended yearly ophthalmology/optometry visit for glaucoma screening and checkup Recommended yearly dental visit for hygiene and checkup  Vaccinations: Influenza vaccine: Done 04/29/2021. Repeat annually  Pneumococcal vaccine: Done 05/30/2013 and 11/16/2014 Tdap vaccine: Done 08/31/2013 Repeat in 10 years  Shingles vaccine: Shingrix discussed. Please contact your pharmacy for coverage information.     Covid-19:Done 09/02/2019, 09/23/2019 and 06/07/2020  Advanced directives: Advance directive discussed with you today. Even though you declined this today, please call our office should you change your mind, and we can give you the proper paperwork for you to fill out.   Conditions/risks identified: KEEP UP THE GOOD WORK!!  Next appointment: Follow up in one year for your annual wellness visit 2023.   Preventive Care 74 Years and Older, Female Preventive care refers to lifestyle choices and visits with your health care provider that can promote health and wellness. What does preventive care include? A yearly physical exam. This is also called an annual well check. Dental exams once or twice a year. Routine eye exams. Ask your health care provider how often you should have your eyes checked. Personal lifestyle choices, including: Daily care of your teeth and gums. Regular physical activity. Eating a healthy diet. Avoiding tobacco and drug use. Limiting alcohol use. Practicing safe sex. Taking low-dose aspirin every day. Taking vitamin and mineral supplements as recommended by your health care  provider. What happens during an annual well check? The services and screenings done by your health care provider during your annual well check will depend on your age, overall health, lifestyle risk factors, and family history of disease. Counseling  Your health care provider may ask you questions about your: Alcohol use. Tobacco use. Drug use. Emotional well-being. Home and relationship well-being. Sexual activity. Eating habits. History of falls. Memory and ability to understand (cognition). Work and work Astronomer. Reproductive health. Screening  You may have the following tests or measurements: Height, weight, and BMI. Blood pressure. Lipid and cholesterol levels. These may be checked every 5 years, or more frequently if you are over 2 years old. Skin check. Lung cancer screening. You may have this screening every year starting at age 74 if you have a 30-pack-year history of smoking and currently smoke or have quit within the past 15 years. Fecal occult blood test (FOBT) of the stool. You may have this test every year starting at age 74 Flexible sigmoidoscopy or colonoscopy. You may have a sigmoidoscopy every 5 years or a colonoscopy every 10 years starting at age 74. Hepatitis C blood test. Hepatitis B blood test. Sexually transmitted disease (STD) testing. Diabetes screening. This is done by checking your blood sugar (glucose) after you have not eaten for a while (fasting). You may have this done every 1-3 years. Bone density scan. This is done to screen for osteoporosis. You may have this done starting at age 74. Mammogram. This may be done every 1-2 years. Talk to your health care provider about how often you should have regular mammograms. Talk with your health care provider about your test results, treatment options, and if necessary, the need for more tests. Vaccines  Your health care provider may  recommend certain vaccines, such as: Influenza vaccine. This is  recommended every year. Tetanus, diphtheria, and acellular pertussis (Tdap, Td) vaccine. You may need a Td booster every 10 years. Zoster vaccine. You may need this after age 74. Pneumococcal 13-valent conjugate (PCV13) vaccine. One dose is recommended after age 74. Pneumococcal polysaccharide (PPSV23) vaccine. One dose is recommended after age 74. Talk to your health care provider about which screenings and vaccines you need and how often you need them. This information is not intended to replace advice given to you by your health care provider. Make sure you discuss any questions you have with your health care provider. Document Released: 08/24/2015 Document Revised: 04/16/2016 Document Reviewed: 05/29/2015 Elsevier Interactive Patient Education  2017 [employer] Prevention in the Home Falls can cause injuries. They can happen to people of all ages. There are many things you can do to make your home safe and to help prevent falls. What can I do on the outside of my home? Regularly fix the edges of walkways and driveways and fix any cracks. Remove anything that might make you trip as you walk through a door, such as a raised step or threshold. Trim any bushes or trees on the path to your home. Use bright outdoor lighting. Clear any walking paths of anything that might make someone trip, such as rocks or tools. Regularly check to see if handrails are loose or broken. Make sure that both sides of any steps have handrails. Any raised decks and porches should have guardrails on the edges. Have any leaves, snow, or ice cleared regularly. Use sand or salt on walking paths during winter. Clean up any spills in your garage right away. This includes oil or grease spills. What can I do in the bathroom? Use night lights. Install grab bars by the toilet and in the tub and shower. Do not use towel bars as grab bars. Use non-skid mats or decals in the tub or shower. If you need to sit down in  the shower, use a plastic, non-slip stool. Keep the floor dry. Clean up any water that spills on the floor as soon as it happens. Remove soap buildup in the tub or shower regularly. Attach bath mats securely with double-sided non-slip rug tape. Do not have throw rugs and other things on the floor that can make you trip. What can I do in the bedroom? Use night lights. Make sure that you have a light by your bed that is easy to reach. Do not use any sheets or blankets that are too big for your bed. They should not hang down onto the floor. Have a firm chair that has side arms. You can use this for support while you get dressed. Do not have throw rugs and other things on the floor that can make you trip. What can I do in the kitchen? Clean up any spills right away. Avoid walking on wet floors. Keep items that you use a lot in easy-to-reach places. If you need to reach something above you, use a strong step stool that has a grab bar. Keep electrical cords out of the way. Do not use floor polish or wax that makes floors slippery. If you must use wax, use non-skid floor wax. Do not have throw rugs and other things on the floor that can make you trip. What can I do with my stairs? Do not leave any items on the stairs. Make sure that there are handrails on both sides of  the stairs and use them. Fix handrails that are broken or loose. Make sure that handrails are as long as the stairways. Check any carpeting to make sure that it is firmly attached to the stairs. Fix any carpet that is loose or worn. Avoid having throw rugs at the top or bottom of the stairs. If you do have throw rugs, attach them to the floor with carpet tape. Make sure that you have a light switch at the top of the stairs and the bottom of the stairs. If you do not have them, ask someone to add them for you. What else can I do to help prevent falls? Wear shoes that: Do not have high heels. Have rubber bottoms. Are comfortable  and fit you well. Are closed at the toe. Do not wear sandals. If you use a stepladder: Make sure that it is fully opened. Do not climb a closed stepladder. Make sure that both sides of the stepladder are locked into place. Ask someone to hold it for you, if possible. Clearly mark and make sure that you can see: Any grab bars or handrails. First and last steps. Where the edge of each step is. Use tools that help you move around (mobility aids) if they are needed. These include: Canes. Walkers. Scooters. Crutches. Turn on the lights when you go into a dark area. Replace any light bulbs as soon as they burn out. Set up your furniture so you have a clear path. Avoid moving your furniture around. If any of your floors are uneven, fix them. If there are any pets around you, be aware of where they are. Review your medicines with your doctor. Some medicines can make you feel dizzy. This can increase your chance of falling. Ask your doctor what other things that you can do to help prevent falls. This information is not intended to replace advice given to you by your health care provider. Make sure you discuss any questions you have with your health care provider. Document Released: 05/24/2009 Document Revised: 01/03/2016 Document Reviewed: 09/01/2014 Elsevier Interactive Patient Education  2017 [employer].

## 2021-07-31 NOTE — Progress Notes (Signed)
Subjective:   Deanna Becker is a 75 y.o. female who presents for an Initial Medicare Annual Wellness Visit. Virtual Visit via Telephone Note  I connected with  Tarri Fuller on 07/31/21 at 11:15 AM EST by telephone and verified that I am speaking with the correct person using two identifiers.  Location: Patient: Home  Provider: [facility] Persons participating in the virtual visit: patient/Nurse Health Advisor   I discussed the limitations, risks, security and privacy concerns of performing an evaluation and management service by telephone and the availability of in person appointments. The patient expressed understanding and agreed to proceed.  Interactive audio and video telecommunications were attempted between this nurse and patient, however failed, due to patient having technical difficulties OR patient did not have access to video capability.  We continued and completed visit with audio only.  Some vital signs may be absent or patient reported.   Darral Dash, LPN  Review of Systems     Cardiac Risk Factors include: advanced age (>58men, >39 women);hypertension;dyslipidemia     Objective:    Today's Vitals   07/31/21 1121  Weight: 169 lb (76.7 kg)  Height: 5\' 4"  (1.626 m)   Body mass index is 29.01 kg/m.  Advanced Directives 07/31/2021 01/09/2018 11/18/2012  Does Patient Have a Medical Advance Directive? No No Patient does not have advance directive;Patient would not like information  Would patient like information on creating a medical advance directive? No - Patient declined - -  Pre-existing out of facility DNR order (yellow form or pink MOST form) - - No    Current Medications (verified) Outpatient Encounter Medications as of 07/31/2021  Medication Sig   aspirin EC 81 MG tablet Take 1 tablet (81 mg total) by mouth daily.   cloNIDine (CATAPRES) 0.3 MG tablet TAKE 1 TABLET BY MOUTH TWICE DAILY   hydrochlorothiazide (HYDRODIURIL) 25 MG tablet TAKE 1 TABLET BY  MOUTH EVERY DAY   lisinopril (ZESTRIL) 40 MG tablet TAKE 1 TABLET(40 MG) BY MOUTH DAILY   metoprolol tartrate (LOPRESSOR) 100 MG tablet TAKE 1 TABLET BY MOUTH TWICE DAILY   Nutritional Supplements (VITAMIN D BOOSTER PO) Take by mouth.   simvastatin (ZOCOR) 40 MG tablet TAKE 1 TABLET(40 MG) BY MOUTH AT BEDTIME   ZINC OXIDE PO Take by mouth.   No facility-administered encounter medications on file as of 07/31/2021.    Allergies (verified) Amlodipine and Sulfa antibiotics   History: Past Medical History:  Diagnosis Date   History of pneumonia    Hyperlipidemia    Hypertension    Past Surgical History:  Procedure Laterality Date   gangloin cyst     Family History  Problem Relation Age of Onset   Asthma Mother    Heart disease Father    Social History   Socioeconomic History   Marital status: Married    Spouse name: 08/02/2021   Number of children: 0   Years of education: Not on file   Highest education level: Not on file  Occupational History   Not on file  Tobacco Use   Smoking status: Never   Smokeless tobacco: Never  Substance and Sexual Activity   Alcohol use: Yes    Comment: occasional   Drug use: No   Sexual activity: Not on file  Other Topics Concern   Not on file  Social History Narrative   Married x 50 03/2021.   Social Determinants of Health   Financial Resource Strain: Not on file  Food Insecurity: No Food Insecurity  Worried About Programme researcher, broadcasting/film/video in the Last Year: Never true   [employer] of Food in the Last Year: Never true  Transportation Needs: No Transportation Needs   Lack of Transportation (Medical): No   Lack of Transportation (Non-Medical): No  Physical Activity: Sufficiently Active   Days of Exercise per Week: 5 days   Minutes of Exercise per Session: 40 min  Stress: No Stress Concern Present   Feeling of Stress : Not at all  Social Connections: Unknown   Frequency of Communication with Friends and Family: More than three times a week    Frequency of Social Gatherings with Friends and Family: More than three times a week   Attends Religious Services: More than 4 times per year   Active Member of [employer] or Organizations: Not on file   Attends Banker Meetings: Not on file   Marital Status: Not on file    Tobacco Counseling Counseling given: Not Answered   Clinical Intake:  Pre-visit preparation completed: Yes  Pain : No/denies pain     BMI - recorded: 29.01 Nutritional Risks: None Diabetes: No  How often do you need to have someone help you when you read instructions, pamphlets, or other written materials from your doctor or pharmacy?: 1 - Never  Diabetic?NO  Interpreter Needed?: No  Information entered by :: mj Artha Chiasson, lpn   Activities of Daily Living In your present state of health, do you have any difficulty performing the following activities: 07/31/2021 07/30/2021  Hearing? N N  Vision? N N  Difficulty concentrating or making decisions? N N  Walking or climbing stairs? N N  Dressing or bathing? N N  Doing errands, shopping? N N  Preparing Food and eating ? N N  Using the Toilet? N N  In the past six months, have you accidently leaked urine? N N  Do you have problems with loss of bowel control? N N  Managing your Medications? N N  Managing your Finances? N N  Housekeeping or managing your Housekeeping? N N  Some recent data might be hidden    Patient Care Team: Bennie Pierini, FNP as PCP - General (Nurse Practitioner)  Indicate any recent Medical Services you may have received from other than [facility] providers in the past year (date may be approximate).     Assessment:   This is a routine wellness examination for Deanna Becker.  Hearing/Vision screen Hearing Screening - Comments:: No hearing issues.  Vision Screening - Comments:: [facility]. [facility]. 2021.  Dietary issues and exercise activities discussed: Current Exercise Habits: Home exercise routine, Type of  exercise: walking, Time (Minutes): 40, Frequency (Times/Week): 5, Weekly Exercise (Minutes/Week): 200, Intensity: Mild, Exercise limited by: cardiac condition(s)   Goals Addressed             This Visit's Progress    Exercise 3x per week (30 min per time)       Continue to exercise and stay healthy.        Depression Screen PHQ 2/9 Scores 07/31/2021 04/29/2021 10/16/2020 06/18/2020 12/15/2019 06/07/2019 10/28/2018  PHQ - 2 Score 0 0 0 0 0 0 0  PHQ- 9 Score - 1 - - - - -    Fall Risk Fall Risk  07/31/2021 07/30/2021 04/29/2021 10/16/2020 06/18/2020  Falls in the past year? 0 0 0 0 0  Number falls in past yr: 0 0 - - -  Injury with Fall? 0 0 - - -  Risk for fall due  to : No Fall Risks - - - -  Follow up Falls prevention discussed - - - -    FALL RISK PREVENTION PERTAINING TO THE HOME:  Any stairs in or around the home? Yes  If so, are there any without handrails? No  Home free of loose throw rugs in walkways, pet beds, electrical cords, etc? Yes  Adequate lighting in your home to reduce risk of falls? Yes   ASSISTIVE DEVICES UTILIZED TO PREVENT FALLS:  Life alert? No  Use of a cane, walker or w/c? No  Grab bars in the bathroom? Yes  Shower chair or bench in shower? No  Elevated toilet seat or a handicapped toilet? No   TIMED UP AND GO:  Was the test performed? No .  PHONE VISIT.  Cognitive Function:     6CIT Screen 07/31/2021  What Year? 0 points  What month? 0 points  What time? 0 points  Count back from 20 0 points  Months in reverse 0 points  Repeat phrase 0 points  Total Score 0    Immunizations Immunization History  Administered Date(s) Administered   Fluad Quad(high Dose 65+) 06/07/2019, 06/18/2020, 04/29/2021   Influenza, High Dose Seasonal PF 07/17/2014, 05/25/2015, 09/09/2017   Influenza,inj,Quad PF,6+ Mos 05/30/2013, 06/24/2018   PFIZER(Purple Top)SARS-COV-2 Vaccination 09/02/2019, 09/23/2019, 06/07/2020   Pneumococcal Conjugate-13 05/30/2013    Pneumococcal Polysaccharide-23 11/16/2014   Tdap 08/31/2013   Zoster, Live 08/31/2013    TDAP status: Up to date  Flu Vaccine status: Up to date  Pneumococcal vaccine status: Up to date  Covid-19 vaccine status: Completed vaccines  Qualifies for Shingles Vaccine? Yes   Zostavax completed Yes   Shingrix Completed?: No.    Education has been provided regarding the importance of this vaccine. Patient has been advised to call insurance company to determine out of pocket expense if they have not yet received this vaccine. Advised may also receive vaccine at local pharmacy or Health Dept. Verbalized acceptance and understanding.  Screening Tests Health Maintenance  Topic Date Due   Zoster Vaccines- Shingrix (1 of 2) Never done   COVID-19 Vaccine (4 - Booster for Pfizer series) 08/02/2020   Fecal DNA (Cologuard)  07/04/2023   TETANUS/TDAP  09/01/2023   Pneumonia Vaccine 3+ Years old  Completed   INFLUENZA VACCINE  Completed   DEXA SCAN  Completed   Hepatitis C Screening  Completed   HPV VACCINES  Aged Out   MAMMOGRAM  Discontinued    Health Maintenance  Health Maintenance Due  Topic Date Due   Zoster Vaccines- Shingrix (1 of 2) Never done   COVID-19 Vaccine (4 - Booster for Pfizer series) 08/02/2020    Colorectal cancer screening: Type of screening: Cologuard. Completed 07/03/2020. Repeat every 3 years  Mammogram status: Ordered PT DECLINES. Pt provided with contact info and advised to call to schedule appt.   Bone Density status: Completed 06/17/2017. Results reflect: Bone density results: OSTEOPOROSIS. Repeat every 2 years.  Lung Cancer Screening: (Low Dose CT Chest recommended if Age 85-80 years, 30 pack-year currently smoking OR have quit w/in 15years.) does not qualify.    Additional Screening:  Hepatitis C Screening: does qualify; Completed 01/17/2016  Vision Screening: Recommended annual ophthalmology exams for early detection of glaucoma and other disorders of the  eye. Is the patient up to date with their annual eye exam?  No  Who is the provider or what is the name of the office in which the patient attends annual eye exams? [facility] If pt is not established with a provider, would they like to be referred to a provider to establish care? No .   Dental Screening: Recommended annual dental exams for proper oral hygiene  Community Resource Referral / Chronic Care Management: CRR required this visit?  No   CCM required this visit?  No      Plan:     I have personally reviewed and noted the following in the patients chart:   Medical and social history Use of alcohol, tobacco or illicit drugs  Current medications and supplements including opioid prescriptions. Patient is not currently taking opioid prescriptions. Functional ability and status Nutritional status Physical activity Advanced directives List of other physicians Hospitalizations, surgeries, and ER visits in previous 12 months Vitals Screenings to include cognitive, depression, and falls Referrals and appointments  In addition, I have reviewed and discussed with patient certain preventive protocols, quality metrics, and best practice recommendations. A written personalized care plan for preventive services as well as general preventive health recommendations were provided to patient.     Darral Dash, LPN   [id number]   Nurse Notes: Discussed Shingrix and how to obtain. Discussed mammogram and pt declined. Discussed bone density and pt declines scheduling at this time. Cologuard up to date.

## 2021-08-16 ENCOUNTER — Ambulatory Visit: Payer: Medicare PPO | Admitting: Nurse Practitioner

## 2021-08-16 ENCOUNTER — Encounter: Payer: Self-pay | Admitting: Nurse Practitioner

## 2021-08-16 VITALS — BP 144/78 | HR 67 | Temp 97.5°F | Resp 20 | Ht 64.0 in | Wt 169.0 lb

## 2021-08-16 DIAGNOSIS — Z6832 Body mass index (BMI) 32.0-32.9, adult: Secondary | ICD-10-CM

## 2021-08-16 DIAGNOSIS — I10 Essential (primary) hypertension: Secondary | ICD-10-CM

## 2021-08-16 DIAGNOSIS — E785 Hyperlipidemia, unspecified: Secondary | ICD-10-CM | POA: Diagnosis not present

## 2021-08-16 DIAGNOSIS — Z23 Encounter for immunization: Secondary | ICD-10-CM

## 2021-08-16 LAB — CBC WITH DIFFERENTIAL/PLATELET
Basophils Absolute: 0.1 10*3/uL (ref 0.0–0.2)
Basos: 1 %
EOS (ABSOLUTE): 0.2 10*3/uL (ref 0.0–0.4)
Eos: 2 %
Hematocrit: 46.2 % (ref 34.0–46.6)
Hemoglobin: 15.5 g/dL (ref 11.1–15.9)
Immature Grans (Abs): 0 10*3/uL (ref 0.0–0.1)
Immature Granulocytes: 0 %
Lymphocytes Absolute: 2.3 10*3/uL (ref 0.7–3.1)
Lymphs: 24 %
MCH: 29.4 pg (ref 26.6–33.0)
MCHC: 33.5 g/dL (ref 31.5–35.7)
MCV: 88 fL (ref 79–97)
Monocytes Absolute: 0.7 10*3/uL (ref 0.1–0.9)
Monocytes: 7 %
Neutrophils Absolute: 6.3 10*3/uL (ref 1.4–7.0)
Neutrophils: 66 %
Platelets: 300 10*3/uL (ref 150–450)
RBC: 5.27 x10E6/uL (ref 3.77–5.28)
RDW: 13.8 % (ref 11.7–15.4)
WBC: 9.6 10*3/uL (ref 3.4–10.8)

## 2021-08-16 LAB — CMP14+EGFR
ALT: 15 IU/L (ref 0–32)
AST: 14 IU/L (ref 0–40)
Albumin/Globulin Ratio: 1.8 (ref 1.2–2.2)
Albumin: 4.8 g/dL — ABNORMAL HIGH (ref 3.7–4.7)
Alkaline Phosphatase: 88 IU/L (ref 44–121)
BUN/Creatinine Ratio: 19 (ref 12–28)
BUN: 16 mg/dL (ref 8–27)
Bilirubin Total: 1.1 mg/dL (ref 0.0–1.2)
CO2: 26 mmol/L (ref 20–29)
Calcium: 9.9 mg/dL (ref 8.7–10.3)
Chloride: 99 mmol/L (ref 96–106)
Creatinine, Ser: 0.83 mg/dL (ref 0.57–1.00)
Globulin, Total: 2.6 g/dL (ref 1.5–4.5)
Glucose: 125 mg/dL — ABNORMAL HIGH (ref 70–99)
Potassium: 4.6 mmol/L (ref 3.5–5.2)
Sodium: 139 mmol/L (ref 134–144)
Total Protein: 7.4 g/dL (ref 6.0–8.5)
eGFR: 74 mL/min/{1.73_m2} (ref >59)

## 2021-08-16 LAB — LIPID PANEL
Chol/HDL Ratio: 2.8 ratio (ref 0.0–4.4)
Cholesterol, Total: 172 mg/dL (ref 100–199)
HDL: 61 mg/dL (ref >39)
LDL Chol Calc (NIH): 86 mg/dL (ref 0–99)
Triglycerides: 146 mg/dL (ref 0–149)
VLDL Cholesterol Cal: 25 mg/dL (ref 5–40)

## 2021-08-16 MED ORDER — METOPROLOL TARTRATE 100 MG PO TABS: ORAL_TABLET | ORAL | 1 refills | Status: DC

## 2021-08-16 MED ORDER — HYDROCHLOROTHIAZIDE 25 MG PO TABS: ORAL_TABLET | ORAL | 1 refills | Status: DC

## 2021-08-16 MED ORDER — LISINOPRIL 40 MG PO TABS: ORAL_TABLET | ORAL | 1 refills | Status: DC

## 2021-08-16 MED ORDER — CLONIDINE HCL 0.3 MG PO TABS: ORAL_TABLET | ORAL | 1 refills | Status: DC

## 2021-08-16 MED ORDER — SIMVASTATIN 40 MG PO TABS: 40.0000 mg | ORAL_TABLET | Freq: Every day | ORAL | 1 refills | Status: DC

## 2021-08-16 NOTE — Patient Instructions (Signed)

## 2021-08-16 NOTE — Progress Notes (Addendum)
Subjective:    Patient ID: Deanna Becker, female    DOB: [birth year], 76 y.o.   MRN: [id number]   Chief Complaint: Medical Management of Chronic Issues    HPI:  Deanna Becker is a 76 y.o. who identifies as a female who was assigned female at birth.   Social history: Lives with: lives with husband Work history: retired   Scientist, forensic in today for follow up of the following chronic medical issues:  1. Primary hypertension No c/o chest pain, sob or headache. Does not check blood pressure at home. She gets very anxious when she comes to the doctor, so blood pressure is always elevated here. BP Readings from Last 3 Encounters:  08/16/21 (!) 154/86  04/29/21 (!) 148/76  10/16/20 (!) 160/85     2. Hyperlipidemia with target LDL less than 100 Does try to watch diet but does no dedicated exercise. Lab Results  Component Value Date   CHOL 165 04/29/2021   HDL 55 04/29/2021   LDLCALC 82 04/29/2021   TRIG 162 (H) 04/29/2021   CHOLHDL 3.0 04/29/2021     3. BMI 32.0-32.9,adult No recent weight changes. Wt Readings from Last 3 Encounters:  08/16/21 169 lb (76.7 kg)  07/31/21 169 lb (76.7 kg)  04/29/21 169 lb (76.7 kg)   BMI Readings from Last 3 Encounters:  08/16/21 29.01 kg/m  07/31/21 29.01 kg/m  04/29/21 29.01 kg/m      New complaints: None today  Allergies  Allergen Reactions   Amlodipine Swelling   Sulfa Antibiotics    Outpatient Encounter Medications as of 08/16/2021  Medication Sig   aspirin EC 81 MG tablet Take 1 tablet (81 mg total) by mouth daily.   Cholecalciferol (VITAMIN D3 PO) Take by mouth.   cloNIDine (CATAPRES) 0.3 MG tablet TAKE 1 TABLET BY MOUTH TWICE DAILY   hydrochlorothiazide (HYDRODIURIL) 25 MG tablet TAKE 1 TABLET BY MOUTH EVERY DAY   lisinopril (ZESTRIL) 40 MG tablet TAKE 1 TABLET(40 MG) BY MOUTH DAILY   metoprolol tartrate (LOPRESSOR) 100 MG tablet TAKE 1 TABLET BY MOUTH TWICE DAILY   simvastatin (ZOCOR) 40 MG tablet TAKE 1 TABLET(40 MG)  BY MOUTH AT BEDTIME   ZINC OXIDE PO Take by mouth.   [DISCONTINUED] Nutritional Supplements (VITAMIN D BOOSTER PO) Take by mouth.   No facility-administered encounter medications on file as of 08/16/2021.    Past Surgical History:  Procedure Laterality Date   gangloin cyst      Family History  Problem Relation Age of Onset   Asthma Mother    Heart disease Father       Controlled substance contract: n/a     Review of Systems  Constitutional:  Negative for diaphoresis.  Eyes:  Negative for pain.  Respiratory:  Negative for shortness of breath.   Cardiovascular:  Negative for chest pain, palpitations and leg swelling.  Gastrointestinal:  Negative for abdominal pain.  Endocrine: Negative for polydipsia.  Skin:  Negative for rash.  Neurological:  Negative for dizziness, weakness and headaches.  Hematological:  Does not bruise/bleed easily.  All other systems reviewed and are negative.     Objective:   Physical Exam Vitals and nursing note reviewed.  Constitutional:      General: She is not in acute distress.    Appearance: Normal appearance. She is well-developed.  HENT:     Head: Normocephalic.     Right Ear: Tympanic membrane normal.     Left Ear: Tympanic membrane normal.     Nose: Nose  normal.     Mouth/Throat:     Mouth: Mucous membranes are moist.  Eyes:     Pupils: Pupils are equal, round, and reactive to light.  Neck:     Vascular: No carotid bruit or JVD.  Cardiovascular:     Rate and Rhythm: Normal rate and regular rhythm.     Heart sounds: Murmur (2/6) heard.  Pulmonary:     Effort: Pulmonary effort is normal. No respiratory distress.     Breath sounds: Normal breath sounds. No wheezing or rales.  Chest:     Chest wall: No tenderness.  Abdominal:     General: Bowel sounds are normal. There is no distension or abdominal bruit.     Palpations: Abdomen is soft. There is no hepatomegaly, splenomegaly, mass or pulsatile mass.     Tenderness: There is  no abdominal tenderness.  Musculoskeletal:        General: Normal range of motion.     Cervical back: Normal range of motion and neck supple.  Lymphadenopathy:     Cervical: No cervical adenopathy.  Skin:    General: Skin is warm and dry.  Neurological:     Mental Status: She is alert and oriented to person, place, and time.     Deep Tendon Reflexes: Reflexes are normal and symmetric.  Psychiatric:        Behavior: Behavior normal.        Thought Content: Thought content normal.        Judgment: Judgment normal.    BP (!) 144/78    Pulse 67    Temp (!) 97.5 F (36.4 C) (Temporal)    Resp 20    Ht _0  (1.626 m)    Wt 169 lb (76.7 kg)    SpO2 99%    BMI 29.01 kg/m         Assessment & Plan:  Deanna Becker comes in today with chief complaint of Medical Management of Chronic Issues   Diagnosis and orders addressed:  1. Primary hypertension Low sodium diet - CBC with Differential/Platelet - CMP14+EGFR - metoprolol tartrate (LOPRESSOR) 100 MG tablet; TAKE 1 TABLET BY MOUTH TWICE DAILY  Dispense: 180 tablet; Refill: 1 - lisinopril (ZESTRIL) 40 MG tablet; TAKE 1 TABLET(40 MG) BY MOUTH DAILY  Dispense: 90 tablet; Refill: 1 - hydrochlorothiazide (HYDRODIURIL) 25 MG tablet; TAKE 1 TABLET BY MOUTH EVERY DAY  Dispense: 90 tablet; Refill: 1 - cloNIDine (CATAPRES) 0.3 MG tablet; TAKE 1 TABLET BY MOUTH TWICE DAILY  Dispense: 180 tablet; Refill: 1  2. Hyperlipidemia with target LDL less than 100 Low fat diet - Lipid panel - simvastatin (ZOCOR) 40 MG tablet; Take 1 tablet (40 mg total) by mouth daily at 6 PM.  Dispense: 90 tablet; Refill: 1  3. BMI 32.0-32.9,adult Discussed diet and exercise for person with BMI >25 Will recheck weight in 3-6 months    Labs pending Health Maintenance reviewed Diet and exercise encouraged  Follow up plan: 6 months   Mary-Margaret Hassell Done, FNP

## 2021-08-16 NOTE — Addendum Note (Signed)
Addended by: Rolena Infante on: 08/16/2021 04:55 PM   Modules accepted: Orders

## 2022-02-17 ENCOUNTER — Encounter: Payer: Self-pay | Admitting: Nurse Practitioner

## 2022-02-17 ENCOUNTER — Ambulatory Visit (INDEPENDENT_AMBULATORY_CARE_PROVIDER_SITE_OTHER): Payer: Medicare PPO

## 2022-02-17 ENCOUNTER — Ambulatory Visit: Payer: Medicare PPO | Admitting: Nurse Practitioner

## 2022-02-17 VITALS — BP 154/80 | HR 64 | Temp 97.4°F | Resp 20 | Ht 64.0 in | Wt 167.0 lb

## 2022-02-17 DIAGNOSIS — M85851 Other specified disorders of bone density and structure, right thigh: Secondary | ICD-10-CM | POA: Diagnosis not present

## 2022-02-17 DIAGNOSIS — M85852 Other specified disorders of bone density and structure, left thigh: Secondary | ICD-10-CM | POA: Diagnosis not present

## 2022-02-17 DIAGNOSIS — M85832 Other specified disorders of bone density and structure, left forearm: Secondary | ICD-10-CM | POA: Diagnosis not present

## 2022-02-17 DIAGNOSIS — Z780 Asymptomatic menopausal state: Secondary | ICD-10-CM | POA: Diagnosis not present

## 2022-02-17 DIAGNOSIS — Z23 Encounter for immunization: Secondary | ICD-10-CM

## 2022-02-17 DIAGNOSIS — Z13820 Encounter for screening for osteoporosis: Secondary | ICD-10-CM

## 2022-02-17 DIAGNOSIS — E785 Hyperlipidemia, unspecified: Secondary | ICD-10-CM

## 2022-02-17 DIAGNOSIS — I10 Essential (primary) hypertension: Secondary | ICD-10-CM

## 2022-02-17 DIAGNOSIS — Z6832 Body mass index (BMI) 32.0-32.9, adult: Secondary | ICD-10-CM

## 2022-02-17 MED ORDER — HYDROCHLOROTHIAZIDE 25 MG PO TABS
ORAL_TABLET | ORAL | 1 refills | Status: DC
Start: 2022-02-17 — End: 2022-06-05

## 2022-02-17 MED ORDER — METOPROLOL TARTRATE 100 MG PO TABS: ORAL_TABLET | ORAL | 1 refills | Status: DC

## 2022-02-17 MED ORDER — LISINOPRIL 40 MG PO TABS: ORAL_TABLET | ORAL | 1 refills | Status: DC

## 2022-02-17 MED ORDER — SIMVASTATIN 40 MG PO TABS: 40.0000 mg | ORAL_TABLET | Freq: Every day | ORAL | 1 refills | Status: DC

## 2022-02-17 MED ORDER — CLONIDINE HCL 0.3 MG PO TABS
ORAL_TABLET | ORAL | 1 refills | Status: DC
Start: 2022-02-17 — End: 2022-09-01

## 2022-02-17 NOTE — Patient Instructions (Signed)

## 2022-02-17 NOTE — Progress Notes (Signed)
Subjective:    Patient ID: Deanna Becker, female    DOB: 07-04-1946, 76 y.o.   MRN: [id number]   Chief Complaint: medical management of chronic issues     HPI:  Deanna Becker is a 76 y.o. who identifies as a female who was assigned female at birth.   Social history: Lives with: husband Work history: retired   Water engineer in today for follow up of the following chronic medical issues:  1. Primary hypertension No c/o chest pain, sob or headache. Does not check blood pressure at home. BP Readings from Last 3 Encounters:  02/17/22 (!) 169/79  08/16/21 (!) 144/78  04/29/21 (!) 148/76      2. Hyperlipidemia with target LDL less than 100 Does not really watch diet and does no dedicated exercise. Lab Results  Component Value Date   CHOL 172 08/16/2021   HDL 61 08/16/2021   LDLCALC 86 08/16/2021   TRIG 146 08/16/2021   CHOLHDL 2.8 08/16/2021   The 10-year ASCVD risk score (Arnett DK, et al., 2019) is: 30.3%   3. BMI 32.0-32.9,adult No recent weight changes Wt Readings from Last 3 Encounters:  02/17/22 167 lb (75.8 kg)  08/16/21 169 lb (76.7 kg)  07/31/21 169 lb (76.7 kg)   BMI Readings from Last 3 Encounters:  02/17/22 28.67 kg/m  08/16/21 29.01 kg/m  07/31/21 29.01 kg/m     New complaints: None today  Allergies  Allergen Reactions   Amlodipine Swelling   Sulfa Antibiotics    Outpatient Encounter Medications as of 02/17/2022  Medication Sig   aspirin EC 81 MG tablet Take 1 tablet (81 mg total) by mouth daily.   Cholecalciferol (VITAMIN D3 PO) Take by mouth.   cloNIDine (CATAPRES) 0.3 MG tablet TAKE 1 TABLET BY MOUTH TWICE DAILY   hydrochlorothiazide (HYDRODIURIL) 25 MG tablet TAKE 1 TABLET BY MOUTH EVERY DAY   lisinopril (ZESTRIL) 40 MG tablet TAKE 1 TABLET(40 MG) BY MOUTH DAILY   metoprolol tartrate (LOPRESSOR) 100 MG tablet TAKE 1 TABLET BY MOUTH TWICE DAILY   simvastatin (ZOCOR) 40 MG tablet Take 1 tablet (40 mg total) by mouth daily at 6 PM.   ZINC  OXIDE PO Take by mouth.   No facility-administered encounter medications on file as of 02/17/2022.    Past Surgical History:  Procedure Laterality Date   gangloin cyst      Family History  Problem Relation Age of Onset   Asthma Mother    Heart disease Father       Controlled substance contract: n/a     Review of Systems  Constitutional:  Negative for diaphoresis.  Eyes:  Negative for pain.  Respiratory:  Negative for shortness of breath.   Cardiovascular:  Negative for chest pain, palpitations and leg swelling.  Gastrointestinal:  Negative for abdominal pain.  Endocrine: Negative for polydipsia.  Skin:  Negative for rash.  Neurological:  Negative for dizziness, weakness and headaches.  Hematological:  Does not bruise/bleed easily.  All other systems reviewed and are negative.      Objective:   Physical Exam Vitals and nursing note reviewed.  Constitutional:      General: She is not in acute distress.    Appearance: Normal appearance. She is well-developed.  HENT:     Head: Normocephalic.     Right Ear: Tympanic membrane normal.     Left Ear: Tympanic membrane normal.     Nose: Nose normal.     Mouth/Throat:     Mouth: Mucous membranes are  moist.  Eyes:     Pupils: Pupils are equal, round, and reactive to light.  Neck:     Vascular: No carotid bruit or JVD.  Cardiovascular:     Rate and Rhythm: Normal rate and regular rhythm.     Heart sounds: Normal heart sounds.  Pulmonary:     Effort: Pulmonary effort is normal. No respiratory distress.     Breath sounds: Normal breath sounds. No wheezing or rales.  Chest:     Chest wall: No tenderness.  Abdominal:     General: Bowel sounds are normal. There is no distension or abdominal bruit.     Palpations: Abdomen is soft. There is no hepatomegaly, splenomegaly, mass or pulsatile mass.     Tenderness: There is no abdominal tenderness.  Musculoskeletal:        General: Normal range of motion.     Cervical back:  Normal range of motion and neck supple.  Lymphadenopathy:     Cervical: No cervical adenopathy.  Skin:    General: Skin is warm and dry.  Neurological:     Mental Status: She is alert and oriented to person, place, and time.     Deep Tendon Reflexes: Reflexes are normal and symmetric.  Psychiatric:        Behavior: Behavior normal.        Thought Content: Thought content normal.        Judgment: Judgment normal.    BP (!) 154/80   Pulse 64   Temp (!) 97.4 F (36.3 C) (Temporal)   Resp 20   Ht 5\' 4"  (1.626 m)   Wt 167 lb (75.8 kg)   SpO2 98%   BMI 28.67 kg/m         Assessment & Plan:   Deanna Becker comes in today with chief complaint of Medical Management of Chronic Issues   Diagnosis and orders addressed:  1. Primary hypertension Dash diet Check blood pressures at home. - metoprolol tartrate (LOPRESSOR) 100 MG tablet; TAKE 1 TABLET BY MOUTH TWICE DAILY  Dispense: 180 tablet; Refill: 1 - lisinopril (ZESTRIL) 40 MG tablet; TAKE 1 TABLET(40 MG) BY MOUTH DAILY  Dispense: 90 tablet; Refill: 1 - hydrochlorothiazide (HYDRODIURIL) 25 MG tablet; TAKE 1 TABLET BY MOUTH EVERY DAY  Dispense: 90 tablet; Refill: 1 - cloNIDine (CATAPRES) 0.3 MG tablet; TAKE 1 TABLET BY MOUTH TWICE DAILY  Dispense: 180 tablet; Refill: 1  2. Hyperlipidemia with target LDL less than 100 Low fat diet - simvastatin (ZOCOR) 40 MG tablet; Take 1 tablet (40 mg total) by mouth daily at 6 PM.  Dispense: 90 tablet; Refill: 1  3. BMI 32.0-32.9,adult Discussed diet and exercise for person with BMI >25 Will recheck weight in 3-6 months   4. Screening for osteoporosis Weight bearing exercises - DG [facility] DEXA   Labs pending Health Maintenance reviewed Diet and exercise encouraged  Follow up plan: 6 months   Mary-Margaret 12-31-1994, FNP

## 2022-02-17 NOTE — Addendum Note (Signed)
Addended by: Cleda Daub on: 02/17/2022 04:17 PM   Modules accepted: Orders

## 2022-02-17 NOTE — Addendum Note (Signed)
Addended by: Cleda Daub on: 02/17/2022 08:56 AM   Modules accepted: Orders

## 2022-02-24 ENCOUNTER — Encounter: Payer: Self-pay | Admitting: *Deleted

## 2022-02-24 NOTE — Progress Notes (Signed)
Send mychart message

## 2022-02-26 ENCOUNTER — Other Ambulatory Visit: Payer: Medicare PPO

## 2022-02-26 DIAGNOSIS — E785 Hyperlipidemia, unspecified: Secondary | ICD-10-CM | POA: Diagnosis not present

## 2022-02-26 DIAGNOSIS — I10 Essential (primary) hypertension: Secondary | ICD-10-CM | POA: Diagnosis not present

## 2022-02-26 LAB — LIPID PANEL
Chol/HDL Ratio: 2.7 ratio (ref 0.0–4.4)
Cholesterol, Total: 148 mg/dL (ref 100–199)
HDL: 54 mg/dL (ref >39)
LDL Chol Calc (NIH): 73 mg/dL (ref 0–99)
Triglycerides: 118 mg/dL (ref 0–149)
VLDL Cholesterol Cal: 21 mg/dL (ref 5–40)

## 2022-02-26 LAB — CMP14+EGFR
ALT: 16 IU/L (ref 0–32)
AST: 15 IU/L (ref 0–40)
Albumin/Globulin Ratio: 2.1 (ref 1.2–2.2)
Albumin: 4.6 g/dL (ref 3.8–4.8)
Alkaline Phosphatase: 78 IU/L (ref 44–121)
BUN/Creatinine Ratio: 18 (ref 12–28)
BUN: 15 mg/dL (ref 8–27)
Bilirubin Total: 0.8 mg/dL (ref 0.0–1.2)
CO2: 25 mmol/L (ref 20–29)
Calcium: 9.6 mg/dL (ref 8.7–10.3)
Chloride: 101 mmol/L (ref 96–106)
Creatinine, Ser: 0.83 mg/dL (ref 0.57–1.00)
Globulin, Total: 2.2 g/dL (ref 1.5–4.5)
Glucose: 122 mg/dL — ABNORMAL HIGH (ref 70–99)
Potassium: 4.2 mmol/L (ref 3.5–5.2)
Sodium: 139 mmol/L (ref 134–144)
Total Protein: 6.8 g/dL (ref 6.0–8.5)
eGFR: 74 mL/min/{1.73_m2} (ref >59)

## 2022-02-26 LAB — CBC WITH DIFFERENTIAL/PLATELET
Basophils Absolute: 0.1 10*3/uL (ref 0.0–0.2)
Basos: 1 %
EOS (ABSOLUTE): 0.2 10*3/uL (ref 0.0–0.4)
Eos: 2 %
Hematocrit: 44 % (ref 34.0–46.6)
Hemoglobin: 14.9 g/dL (ref 11.1–15.9)
Immature Grans (Abs): 0 10*3/uL (ref 0.0–0.1)
Immature Granulocytes: 0 %
Lymphocytes Absolute: 2.4 10*3/uL (ref 0.7–3.1)
Lymphs: 34 %
MCH: 29.3 pg (ref 26.6–33.0)
MCHC: 33.9 g/dL (ref 31.5–35.7)
MCV: 87 fL (ref 79–97)
Monocytes Absolute: 0.6 10*3/uL (ref 0.1–0.9)
Monocytes: 9 %
Neutrophils Absolute: 3.7 10*3/uL (ref 1.4–7.0)
Neutrophils: 54 %
Platelets: 278 10*3/uL (ref 150–450)
RBC: 5.08 x10E6/uL (ref 3.77–5.28)
RDW: 13.5 % (ref 11.7–15.4)
WBC: 7 10*3/uL (ref 3.4–10.8)

## 2022-06-04 ENCOUNTER — Other Ambulatory Visit: Payer: Self-pay | Admitting: Nurse Practitioner

## 2022-06-04 DIAGNOSIS — I10 Essential (primary) hypertension: Secondary | ICD-10-CM

## 2022-06-05 ENCOUNTER — Other Ambulatory Visit: Payer: Self-pay | Admitting: Nurse Practitioner

## 2022-06-05 DIAGNOSIS — I10 Essential (primary) hypertension: Secondary | ICD-10-CM

## 2022-06-13 ENCOUNTER — Other Ambulatory Visit: Payer: Self-pay | Admitting: Nurse Practitioner

## 2022-06-13 DIAGNOSIS — I10 Essential (primary) hypertension: Secondary | ICD-10-CM

## 2022-06-16 ENCOUNTER — Other Ambulatory Visit: Payer: Self-pay | Admitting: Nurse Practitioner

## 2022-06-16 DIAGNOSIS — E785 Hyperlipidemia, unspecified: Secondary | ICD-10-CM

## 2022-08-06 ENCOUNTER — Ambulatory Visit (INDEPENDENT_AMBULATORY_CARE_PROVIDER_SITE_OTHER): Payer: Medicare PPO

## 2022-08-06 VITALS — Wt 156.0 lb

## 2022-08-06 DIAGNOSIS — Z0000 Encounter for general adult medical examination without abnormal findings: Secondary | ICD-10-CM

## 2022-08-06 NOTE — Patient Instructions (Signed)
[name] , Thank you for taking time to come for your Medicare Wellness Visit. I appreciate your ongoing commitment to your health goals. Please review the following plan we discussed and let me know if I can assist you in the future.   These are the goals we discussed:  Goals      Exercise 3x per week (30 min per time)     Continue to exercise and stay healthy.      Patient Stated     Lose a little more weight         This is a list of the screening recommended for you and due dates:  Health Maintenance  Topic Date Due   COVID-19 Vaccine (4 - 2023-24 season) 04/11/2022   Cologuard (Stool DNA test)  07/04/2023   Medicare Annual Wellness Visit  08/07/2023   DTaP/Tdap/Td vaccine (2 - Td or Tdap) 09/01/2023   Pneumonia Vaccine  Completed   Flu Shot  Completed   DEXA scan (bone density measurement)  Completed   Hepatitis C Screening: USPSTF Recommendation to screen - Ages 42-79 yo.  Completed   Zoster (Shingles) Vaccine  Completed   HPV Vaccine  Aged Out    Advanced directives: Advance directive discussed with you today. Even though you declined this today please call our office should you change your mind and we can give you the proper paperwork for you to fill out.  Conditions/risks identified: lose a few more lbs   Next appointment: Follow up in one year for your annual wellness visit    Preventive Care 65 Years and Older, Female Preventive care refers to lifestyle choices and visits with your health care provider that can promote health and wellness. What does preventive care include? A yearly physical exam. This is also called an annual well check. Dental exams once or twice a year. Routine eye exams. Ask your health care provider how often you should have your eyes checked. Personal lifestyle choices, including: Daily care of your teeth and gums. Regular physical activity. Eating a healthy diet. Avoiding tobacco and drug use. Limiting alcohol use. Practicing safe  sex. Taking low-dose aspirin every day. Taking vitamin and mineral supplements as recommended by your health care provider. What happens during an annual well check? The services and screenings done by your health care provider during your annual well check will depend on your age, overall health, lifestyle risk factors, and family history of disease. Counseling  Your health care provider may ask you questions about your: Alcohol use. Tobacco use. Drug use. Emotional well-being. Home and relationship well-being. Sexual activity. Eating habits. History of falls. Memory and ability to understand (cognition). Work and work Astronomer. Reproductive health. Screening  You may have the following tests or measurements: Height, weight, and BMI. Blood pressure. Lipid and cholesterol levels. These may be checked every 5 years, or more frequently if you are over 40 years old. Skin check. Lung cancer screening. You may have this screening every year starting at age 2 if you have a 30-pack-year history of smoking and currently smoke or have quit within the past 15 years. Fecal occult blood test (FOBT) of the stool. You may have this test every year starting at age 71. Flexible sigmoidoscopy or colonoscopy. You may have a sigmoidoscopy every 5 years or a colonoscopy every 10 years starting at age 32. Hepatitis C blood test. Hepatitis B blood test. Sexually transmitted disease (STD) testing. Diabetes screening. This is done by checking your blood sugar (glucose) after  you have not eaten for a while (fasting). You may have this done every 1-3 years. Bone density scan. This is done to screen for osteoporosis. You may have this done starting at age 1. Mammogram. This may be done every 1-2 years. Talk to your health care provider about how often you should have regular mammograms. Talk with your health care provider about your test results, treatment options, and if necessary, the need for more  tests. Vaccines  Your health care provider may recommend certain vaccines, such as: Influenza vaccine. This is recommended every year. Tetanus, diphtheria, and acellular pertussis (Tdap, Td) vaccine. You may need a Td booster every 10 years. Zoster vaccine. You may need this after age 57. Pneumococcal 13-valent conjugate (PCV13) vaccine. One dose is recommended after age 19. Pneumococcal polysaccharide (PPSV23) vaccine. One dose is recommended after age 56. Talk to your health care provider about which screenings and vaccines you need and how often you need them. This information is not intended to replace advice given to you by your health care provider. Make sure you discuss any questions you have with your health care provider. Document Released: 08/24/2015 Document Revised: 04/16/2016 Document Reviewed: 05/29/2015 Elsevier Interactive Patient Education  2017 [place] Prevention in the Home Falls can cause injuries. They can happen to people of all ages. There are many things you can do to make your home safe and to help prevent falls. What can I do on the outside of my home? Regularly fix the edges of walkways and driveways and fix any cracks. Remove anything that might make you trip as you walk through a door, such as a raised step or threshold. Trim any bushes or trees on the path to your home. Use bright outdoor lighting. Clear any walking paths of anything that might make someone trip, such as rocks or tools. Regularly check to see if handrails are loose or broken. Make sure that both sides of any steps have handrails. Any raised decks and porches should have guardrails on the edges. Have any leaves, snow, or ice cleared regularly. Use sand or salt on walking paths during winter. Clean up any spills in your garage right away. This includes oil or grease spills. What can I do in the bathroom? Use night lights. Install grab bars by the toilet and in the tub and shower.  Do not use towel bars as grab bars. Use non-skid mats or decals in the tub or shower. If you need to sit down in the shower, use a plastic, non-slip stool. Keep the floor dry. Clean up any water that spills on the floor as soon as it happens. Remove soap buildup in the tub or shower regularly. Attach bath mats securely with double-sided non-slip rug tape. Do not have throw rugs and other things on the floor that can make you trip. What can I do in the bedroom? Use night lights. Make sure that you have a light by your bed that is easy to reach. Do not use any sheets or blankets that are too big for your bed. They should not hang down onto the floor. Have a firm chair that has side arms. You can use this for support while you get dressed. Do not have throw rugs and other things on the floor that can make you trip. What can I do in the kitchen? Clean up any spills right away. Avoid walking on wet floors. Keep items that you use a lot in easy-to-reach places. If you  need to reach something above you, use a strong step stool that has a grab bar. Keep electrical cords out of the way. Do not use floor polish or wax that makes floors slippery. If you must use wax, use non-skid floor wax. Do not have throw rugs and other things on the floor that can make you trip. What can I do with my stairs? Do not leave any items on the stairs. Make sure that there are handrails on both sides of the stairs and use them. Fix handrails that are broken or loose. Make sure that handrails are as long as the stairways. Check any carpeting to make sure that it is firmly attached to the stairs. Fix any carpet that is loose or worn. Avoid having throw rugs at the top or bottom of the stairs. If you do have throw rugs, attach them to the floor with carpet tape. Make sure that you have a light switch at the top of the stairs and the bottom of the stairs. If you do not have them, ask someone to add them for you. What else  can I do to help prevent falls? Wear shoes that: Do not have high heels. Have rubber bottoms. Are comfortable and fit you well. Are closed at the toe. Do not wear sandals. If you use a stepladder: Make sure that it is fully opened. Do not climb a closed stepladder. Make sure that both sides of the stepladder are locked into place. Ask someone to hold it for you, if possible. Clearly mark and make sure that you can see: Any grab bars or handrails. First and last steps. Where the edge of each step is. Use tools that help you move around (mobility aids) if they are needed. These include: Canes. Walkers. Scooters. Crutches. Turn on the lights when you go into a dark area. Replace any light bulbs as soon as they burn out. Set up your furniture so you have a clear path. Avoid moving your furniture around. If any of your floors are uneven, fix them. If there are any pets around you, be aware of where they are. Review your medicines with your doctor. Some medicines can make you feel dizzy. This can increase your chance of falling. Ask your doctor what other things that you can do to help prevent falls. This information is not intended to replace advice given to you by your health care provider. Make sure you discuss any questions you have with your health care provider. Document Released: 05/24/2009 Document Revised: 01/03/2016 Document Reviewed: 09/01/2014 Elsevier Interactive Patient Education  2017 [employer].

## 2022-08-06 NOTE — Progress Notes (Signed)
I connected with  Tarri Fuller on 08/06/22 by a audio enabled telemedicine application and verified that I am speaking with the correct person using two identifiers.  Patient Location: Home  Provider Location: Home Office  I discussed the limitations of evaluation and management by telemedicine. The patient expressed understanding and agreed to proceed.   Subjective:   Deanna Becker is a 76 y.o. female who presents for Medicare Annual (Subsequent) preventive examination.  Review of Systems     Cardiac Risk Factors include: advanced age (>6men, >88 women);hypertension;dyslipidemia     Objective:    Today's Vitals   08/06/22 1143  Weight: 156 lb (70.8 kg)   Body mass index is 26.78 kg/m.     08/06/2022   11:48 AM 07/31/2021   11:48 AM 01/09/2018    4:39 PM 11/18/2012   10:46 PM  Advanced Directives  Does Patient Have a Medical Advance Directive? No No No Patient does not have advance directive;Patient would not like information  Would patient like information on creating a medical advance directive? No - Patient declined No - Patient declined    Pre-existing out of facility DNR order (yellow form or pink MOST form)    No    Current Medications (verified) Outpatient Encounter Medications as of 08/06/2022  Medication Sig   aspirin EC 81 MG tablet Take 1 tablet (81 mg total) by mouth daily.   Calcium Carbonate (CALCIUM 500 PO) Take by mouth.   Cholecalciferol (VITAMIN D3 PO) Take by mouth.   cloNIDine (CATAPRES) 0.3 MG tablet TAKE 1 TABLET BY MOUTH TWICE DAILY   hydrochlorothiazide (HYDRODIURIL) 25 MG tablet TAKE 1 TABLET BY MOUTH EVERY DAY   lisinopril (ZESTRIL) 40 MG tablet TAKE 1 TABLET(40 MG) BY MOUTH DAILY   metoprolol tartrate (LOPRESSOR) 100 MG tablet TAKE 1 TABLET BY MOUTH TWICE DAILY   simvastatin (ZOCOR) 40 MG tablet TAKE 1 TABLET(40 MG) BY MOUTH DAILY AT 6 PM   ZINC OXIDE PO Take by mouth.   No facility-administered encounter medications on file as of  08/06/2022.    Allergies (verified) Amlodipine and Sulfa antibiotics   History: Past Medical History:  Diagnosis Date   History of pneumonia    Hyperlipidemia    Hypertension    Past Surgical History:  Procedure Laterality Date   gangloin cyst     Family History  Problem Relation Age of Onset   Asthma Mother    Heart disease Father    Social History   Socioeconomic History   Marital status: Married    Spouse name: Fredrik Cove   Number of children: 0   Years of education: Not on file   Highest education level: Not on file  Occupational History   Not on file  Tobacco Use   Smoking status: Never   Smokeless tobacco: Never  Substance and Sexual Activity   Alcohol use: Yes    Comment: occasional   Drug use: No   Sexual activity: Not on file  Other Topics Concern   Not on file  Social History Narrative   Married x 50 03/2021.   Social Determinants of Health   Financial Resource Strain: Low Risk  (08/06/2022)   Overall Financial Resource Strain (CARDIA)    Difficulty of Paying Living Expenses: Not hard at all  Food Insecurity: No Food Insecurity (08/06/2022)   Hunger Vital Sign    Worried About Running Out of Food in the Last Year: Never true    Ran Out of Food in the Last Year: Never  true  Transportation Needs: No Transportation Needs (08/06/2022)   PRAPARE - Administrator, Civil Service (Medical): No    Lack of Transportation (Non-Medical): No  Physical Activity: Sufficiently Active (08/06/2022)   Exercise Vital Sign    Days of Exercise per Week: 5 days    Minutes of Exercise per Session: 50 min  Stress: No Stress Concern Present (08/06/2022)   [employer] of Occupational Health - Occupational Stress Questionnaire    Feeling of Stress : Not at all  Social Connections: Socially Integrated (08/06/2022)   Social Connection and Isolation Panel [NHANES]    Frequency of Communication with Friends and Family: More than three times a week    Frequency  of Social Gatherings with Friends and Family: More than three times a week    Attends Religious Services: More than 4 times per year    Active Member of [employer] or Organizations: Yes    Attends Banker Meetings: 1 to 4 times per year    Marital Status: Married    Tobacco Counseling Counseling given: Not Answered   Clinical Intake:  Pre-visit preparation completed: Yes  Pain : No/denies pain     BMI - recorded: 26.78 Nutritional Status: BMI 25 -29 Overweight Nutritional Risks: None Diabetes: No  How often do you need to have someone help you when you read instructions, pamphlets, or other written materials from your doctor or pharmacy?: 1 - Never  Diabetic?no  Interpreter Needed?: No  Information entered by :: Lanier Ensign, LPN   Activities of Daily Living    08/06/2022   11:49 AM  In your present state of health, do you have any difficulty performing the following activities:  Hearing? 0  Vision? 0  Difficulty concentrating or making decisions? 0  Walking or climbing stairs? 0  Dressing or bathing? 0  Doing errands, shopping? 0  Preparing Food and eating ? N  Using the Toilet? N  In the past six months, have you accidently leaked urine? N  Do you have problems with loss of bowel control? N  Managing your Medications? N  Managing your Finances? N  Housekeeping or managing your Housekeeping? N    Patient Care Team: Bennie Pierini, FNP as PCP - General (Nurse Practitioner)  Indicate any recent Medical Services you may have received from other than [facility] providers in the past year (date may be approximate).     Assessment:   This is a routine wellness examination for Deanna Becker.  Hearing/Vision screen Hearing Screening - Comments:: Pt denies any hearing issues  Vision Screening - Comments:: Pt will follow up with provider  Dietary issues and exercise activities discussed: Current Exercise Habits: Home exercise routine, Type of exercise:  walking, Time (Minutes): 45, Frequency (Times/Week): 5, Weekly Exercise (Minutes/Week): 225   Goals Addressed             This Visit's Progress    Patient Stated       Lose a little more weight        Depression Screen    08/06/2022   11:50 AM 08/06/2022   11:49 AM 02/17/2022    8:06 AM 08/16/2021    7:58 AM 07/31/2021   11:24 AM 04/29/2021    8:06 AM 10/16/2020   10:41 AM  PHQ 2/9 Scores  PHQ - 2 Score 0 0 0 0 0 0 0  PHQ- 9 Score   0 0  1     Fall Risk    08/06/2022  11:49 AM 02/17/2022    8:06 AM 08/16/2021    7:58 AM 07/31/2021   11:49 AM 07/30/2021    2:04 PM  Fall Risk   Falls in the past year? 0 0 0 0 0  Number falls in past yr: 0   0 0  Injury with Fall? 0   0 0  Risk for fall due to : Impaired vision   No Fall Risks   Follow up Falls prevention discussed   Falls prevention discussed     FALL RISK PREVENTION PERTAINING TO THE HOME:  Any stairs in or around the home? Yes  If so, are there any without handrails? No  Home free of loose throw rugs in walkways, pet beds, electrical cords, etc? Yes  Adequate lighting in your home to reduce risk of falls? Yes   ASSISTIVE DEVICES UTILIZED TO PREVENT FALLS:  Life alert? No  Use of a cane, walker or w/c? No  Grab bars in the bathroom? No  Shower chair or bench in shower? Yes  Elevated toilet seat or a handicapped toilet? No   TIMED UP AND GO:  Was the test performed? No .  Cognitive Function:        08/06/2022   11:51 AM 07/31/2021   11:50 AM  6CIT Screen  What Year? 0 points 0 points  What month? 0 points 0 points  What time? 0 points 0 points  Count back from 20 0 points 0 points  Months in reverse 0 points 0 points  Repeat phrase 0 points 0 points  Total Score 0 points 0 points    Immunizations Immunization History  Administered Date(s) Administered   Fluad Quad(high Dose 65+) 06/07/2019, 06/18/2020, 04/29/2021   Influenza, High Dose Seasonal PF 07/17/2014, 05/25/2015, 09/09/2017    Influenza,inj,Quad PF,6+ Mos 05/30/2013, 06/24/2018   Influenza-Unspecified 06/13/2022   PFIZER(Purple Top)SARS-COV-2 Vaccination 09/02/2019, 09/23/2019, 06/07/2020   Pneumococcal Conjugate-13 05/30/2013   Pneumococcal Polysaccharide-23 11/16/2014   Tdap 08/31/2013   Zoster Recombinat (Shingrix) 08/16/2021, 02/17/2022   Zoster, Live 08/31/2013    TDAP status: Up to date  Flu Vaccine status: Up to date  Pneumococcal vaccine status: Up to date  Covid-19 vaccine status: Completed vaccines  Qualifies for Shingles Vaccine? Yes   Zostavax completed Yes   Shingrix Completed?: Yes  Screening Tests Health Maintenance  Topic Date Due   COVID-19 Vaccine (4 - 2023-24 season) 04/11/2022   Fecal DNA (Cologuard)  07/04/2023   Medicare Annual Wellness (AWV)  08/07/2023   DTaP/Tdap/Td (2 - Td or Tdap) 09/01/2023   Pneumonia Vaccine 60+ Years old  Completed   INFLUENZA VACCINE  Completed   DEXA SCAN  Completed   Hepatitis C Screening  Completed   Zoster Vaccines- Shingrix  Completed   HPV VACCINES  Aged Out    Health Maintenance  Health Maintenance Due  Topic Date Due   COVID-19 Vaccine (4 - 2023-24 season) 04/11/2022    Colorectal cancer screening: Type of screening: Cologuard. Completed 07/03/20. Repeat every 3 years  Mammogram status: No longer required due to per pt .  Bone Density status: Completed 02/17/22. Results reflect: Bone density results: OSTEOPENIA. Repeat every 2 years.   Additional Screening:  Hepatitis C Screening:  Completed 01/17/16  Vision Screening: Recommended annual ophthalmology exams for early detection of glaucoma and other disorders of the eye. Is the patient up to date with their annual eye exam?  No  Who is the provider or what is the name of the office in which  the patient attends annual eye exams? Pt stated she will follow up  If pt is not established with a provider, would they like to be referred to a provider to establish care? No .   Dental  Screening: Recommended annual dental exams for proper oral hygiene  Community Resource Referral / Chronic Care Management: CRR required this visit?  No   CCM required this visit?  No      Plan:     I have personally reviewed and noted the following in the patient's chart:   Medical and social history Use of alcohol, tobacco or illicit drugs  Current medications and supplements including opioid prescriptions. Patient is not currently taking opioid prescriptions. Functional ability and status Nutritional status Physical activity Advanced directives List of other physicians Hospitalizations, surgeries, and ER visits in previous 12 months Vitals Screenings to include cognitive, depression, and falls Referrals and appointments  In addition, I have reviewed and discussed with patient certain preventive protocols, quality metrics, and best practice recommendations. A written personalized care plan for preventive services as well as general preventive health recommendations were provided to patient.     Deanna Schleinina H Prestyn Stanco, LPN   16/10/960412/27/2023   Nurse Notes: none

## 2022-08-21 ENCOUNTER — Ambulatory Visit: Payer: Medicare PPO | Admitting: Nurse Practitioner

## 2022-09-01 ENCOUNTER — Other Ambulatory Visit: Payer: Self-pay | Admitting: Nurse Practitioner

## 2022-09-01 DIAGNOSIS — I10 Essential (primary) hypertension: Secondary | ICD-10-CM

## 2022-09-05 ENCOUNTER — Encounter: Payer: Self-pay | Admitting: Nurse Practitioner

## 2022-09-05 ENCOUNTER — Ambulatory Visit: Payer: Medicare PPO | Admitting: Nurse Practitioner

## 2022-09-05 VITALS — BP 141/80 | HR 73 | Temp 97.4°F | Resp 20 | Ht 64.0 in | Wt 154.0 lb

## 2022-09-05 DIAGNOSIS — Z6832 Body mass index (BMI) 32.0-32.9, adult: Secondary | ICD-10-CM | POA: Diagnosis not present

## 2022-09-05 DIAGNOSIS — I10 Essential (primary) hypertension: Secondary | ICD-10-CM

## 2022-09-05 DIAGNOSIS — E785 Hyperlipidemia, unspecified: Secondary | ICD-10-CM

## 2022-09-05 LAB — CMP14+EGFR
ALT: 13 IU/L (ref 0–32)
AST: 14 IU/L (ref 0–40)
Albumin/Globulin Ratio: 2.2 (ref 1.2–2.2)
Albumin: 4.7 g/dL (ref 3.8–4.8)
Alkaline Phosphatase: 76 IU/L (ref 44–121)
BUN/Creatinine Ratio: 21 (ref 12–28)
BUN: 16 mg/dL (ref 8–27)
Bilirubin Total: 1.2 mg/dL (ref 0.0–1.2)
CO2: 23 mmol/L (ref 20–29)
Calcium: 9.7 mg/dL (ref 8.7–10.3)
Chloride: 101 mmol/L (ref 96–106)
Creatinine, Ser: 0.78 mg/dL (ref 0.57–1.00)
Globulin, Total: 2.1 g/dL (ref 1.5–4.5)
Glucose: 124 mg/dL — ABNORMAL HIGH (ref 70–99)
Potassium: 4.2 mmol/L (ref 3.5–5.2)
Sodium: 140 mmol/L (ref 134–144)
Total Protein: 6.8 g/dL (ref 6.0–8.5)
eGFR: 79 mL/min/{1.73_m2} (ref >59)

## 2022-09-05 LAB — LIPID PANEL
Chol/HDL Ratio: 2.5 ratio (ref 0.0–4.4)
Cholesterol, Total: 142 mg/dL (ref 100–199)
HDL: 56 mg/dL (ref >39)
LDL Chol Calc (NIH): 68 mg/dL (ref 0–99)
Triglycerides: 100 mg/dL (ref 0–149)
VLDL Cholesterol Cal: 18 mg/dL (ref 5–40)

## 2022-09-05 LAB — CBC WITH DIFFERENTIAL/PLATELET
Basophils Absolute: 0.1 10*3/uL (ref 0.0–0.2)
Basos: 1 %
EOS (ABSOLUTE): 0.1 10*3/uL (ref 0.0–0.4)
Eos: 1 %
Hematocrit: 43.8 % (ref 34.0–46.6)
Hemoglobin: 14.2 g/dL (ref 11.1–15.9)
Immature Grans (Abs): 0 10*3/uL (ref 0.0–0.1)
Immature Granulocytes: 0 %
Lymphocytes Absolute: 2.1 10*3/uL (ref 0.7–3.1)
Lymphs: 27 %
MCH: 28.7 pg (ref 26.6–33.0)
MCHC: 32.4 g/dL (ref 31.5–35.7)
MCV: 89 fL (ref 79–97)
Monocytes Absolute: 0.6 10*3/uL (ref 0.1–0.9)
Monocytes: 8 %
Neutrophils Absolute: 4.8 10*3/uL (ref 1.4–7.0)
Neutrophils: 63 %
Platelets: 308 10*3/uL (ref 150–450)
RBC: 4.94 x10E6/uL (ref 3.77–5.28)
RDW: 13.3 % (ref 11.7–15.4)
WBC: 7.7 10*3/uL (ref 3.4–10.8)

## 2022-09-05 MED ORDER — LISINOPRIL 40 MG PO TABS: 40.0000 mg | ORAL_TABLET | Freq: Every day | ORAL | 1 refills | Status: DC

## 2022-09-05 MED ORDER — METOPROLOL TARTRATE 100 MG PO TABS: ORAL_TABLET | ORAL | 1 refills | Status: DC

## 2022-09-05 MED ORDER — HYDROCHLOROTHIAZIDE 25 MG PO TABS: 25.0000 mg | ORAL_TABLET | Freq: Every day | ORAL | 1 refills | Status: DC

## 2022-09-05 MED ORDER — SIMVASTATIN 40 MG PO TABS: ORAL_TABLET | ORAL | 1 refills | Status: DC

## 2022-09-05 MED ORDER — CLONIDINE HCL 0.3 MG PO TABS: 0.3000 mg | ORAL_TABLET | Freq: Two times a day (BID) | ORAL | 1 refills | Status: DC

## 2022-09-05 NOTE — Progress Notes (Signed)
Subjective:    Patient ID: Deanna Becker, female    DOB: 04-Nov-1945, 77 y.o.   MRN: [id number]   Chief Complaint: medical management of chronic issues     HPI:  Deanna Becker is a 77 y.o. who identifies as a female who was assigned female at birth.   Social history: Lives with: husband Work history: retired   Scientist, forensic in today for follow up of the following chronic medical issues:  1. Primary hypertension No c/o chest pain, sob or headache. Does not check blood pressure at home. BP Readings from Last 3 Encounters:  02/17/22 (!) 154/80  08/16/21 (!) 144/78  04/29/21 (!) 148/76     2. Hyperlipidemia with target LDL less than 100 Does try to watch diet and does no dedicated exercise. Lab Results  Component Value Date   CHOL 148 02/26/2022   HDL 54 02/26/2022   LDLCALC 73 02/26/2022   TRIG 118 02/26/2022   CHOLHDL 2.7 02/26/2022      3. BMI 32.0-32.9,adult No recent weight changes Wt Readings from Last 3 Encounters:  09/05/22 154 lb (69.9 kg)  08/06/22 156 lb (70.8 kg)  02/17/22 167 lb (75.8 kg)   BMI Readings from Last 3 Encounters:  09/05/22 26.43 kg/m  08/06/22 26.78 kg/m  02/17/22 28.67 kg/m     New complaints: None today  Allergies  Allergen Reactions   Amlodipine Swelling   Sulfa Antibiotics    Outpatient Encounter Medications as of 09/05/2022  Medication Sig   aspirin EC 81 MG tablet Take 1 tablet (81 mg total) by mouth daily.   Calcium Carbonate (CALCIUM 500 PO) Take by mouth.   Cholecalciferol (VITAMIN D3 PO) Take by mouth.   cloNIDine (CATAPRES) 0.3 MG tablet TAKE 1 TABLET BY MOUTH TWICE DAILY   hydrochlorothiazide (HYDRODIURIL) 25 MG tablet TAKE 1 TABLET BY MOUTH EVERY DAY   lisinopril (ZESTRIL) 40 MG tablet TAKE 1 TABLET(40 MG) BY MOUTH DAILY   metoprolol tartrate (LOPRESSOR) 100 MG tablet TAKE 1 TABLET BY MOUTH TWICE DAILY   simvastatin (ZOCOR) 40 MG tablet TAKE 1 TABLET(40 MG) BY MOUTH DAILY AT 6 PM   ZINC OXIDE PO Take by mouth.    No facility-administered encounter medications on file as of 09/05/2022.    Past Surgical History:  Procedure Laterality Date   gangloin cyst      Family History  Problem Relation Age of Onset   Asthma Mother    Heart disease Father       Controlled substance contract: n/a     Review of Systems  Constitutional:  Negative for diaphoresis.  Eyes:  Negative for pain.  Respiratory:  Negative for shortness of breath.   Cardiovascular:  Negative for chest pain, palpitations and leg swelling.  Gastrointestinal:  Negative for abdominal pain.  Endocrine: Negative for polydipsia.  Skin:  Negative for rash.  Neurological:  Negative for dizziness, weakness and headaches.  Hematological:  Does not bruise/bleed easily.  All other systems reviewed and are negative.      Objective:   Physical Exam Vitals and nursing note reviewed.  Constitutional:      General: She is not in acute distress.    Appearance: Normal appearance. She is well-developed.  HENT:     Head: Normocephalic.     Right Ear: Tympanic membrane normal.     Left Ear: Tympanic membrane normal.     Nose: Nose normal.     Mouth/Throat:     Mouth: Mucous membranes are moist.  Eyes:  Pupils: Pupils are equal, round, and reactive to light.  Neck:     Vascular: No carotid bruit or JVD.  Cardiovascular:     Rate and Rhythm: Normal rate and regular rhythm.     Heart sounds: Normal heart sounds.  Pulmonary:     Effort: Pulmonary effort is normal. No respiratory distress.     Breath sounds: Normal breath sounds. No wheezing or rales.  Chest:     Chest wall: No tenderness.  Abdominal:     General: Bowel sounds are normal. There is no distension or abdominal bruit.     Palpations: Abdomen is soft. There is no hepatomegaly, splenomegaly, mass or pulsatile mass.     Tenderness: There is no abdominal tenderness.  Musculoskeletal:        General: Normal range of motion.     Cervical back: Normal range of motion  and neck supple.  Lymphadenopathy:     Cervical: No cervical adenopathy.  Skin:    General: Skin is warm and dry.  Neurological:     Mental Status: She is alert and oriented to person, place, and time.     Deep Tendon Reflexes: Reflexes are normal and symmetric.  Psychiatric:        Behavior: Behavior normal.        Thought Content: Thought content normal.        Judgment: Judgment normal.     BP (!) 141/80   Pulse 73   Temp (!) 97.4 F (36.3 C) (Temporal)   Resp 20   Ht 5\' 4"  (1.626 m)   Wt 154 lb (69.9 kg)   SpO2 99%   BMI 26.43 kg/m        Assessment & Plan:   Deanna Becker comes in today with chief complaint of Medical Management of Chronic Issues   Diagnosis and orders addressed:  1. Primary hypertension Low sodium diet - cloNIDine (CATAPRES) 0.3 MG tablet; Take 1 tablet (0.3 mg total) by mouth 2 (two) times daily.  Dispense: 180 tablet; Refill: 1 - lisinopril (ZESTRIL) 40 MG tablet; Take 1 tablet (40 mg total) by mouth daily.  Dispense: 90 tablet; Refill: 1 - hydrochlorothiazide (HYDRODIURIL) 25 MG tablet; Take 1 tablet (25 mg total) by mouth daily.  Dispense: 90 tablet; Refill: 1 - metoprolol tartrate (LOPRESSOR) 100 MG tablet; TAKE 1 TABLET BY MOUTH TWICE DAILY  Dispense: 180 tablet; Refill: 1 - CBC with Differential/Platelet - CMP14+EGFR  2. Hyperlipidemia with target LDL less than 100 Low fat diet - simvastatin (ZOCOR) 40 MG tablet; TAKE 1 TABLET(40 MG) BY MOUTH DAILY AT 6 PM  Dispense: 90 tablet; Refill: 1 - Lipid panel  3. BMI 32.0-32.9,adult Discussed diet and exercise for person with BMI >25 Will recheck weight in 3-6 months    Labs pending Health Maintenance reviewed Diet and exercise encouraged  Follow up plan: 6 months   Mary-Margaret Hassell Done, FNP

## 2022-09-05 NOTE — Patient Instructions (Signed)

## 2022-12-15 ENCOUNTER — Other Ambulatory Visit: Payer: Self-pay | Admitting: Nurse Practitioner

## 2022-12-15 ENCOUNTER — Other Ambulatory Visit: Payer: Self-pay

## 2022-12-15 DIAGNOSIS — I10 Essential (primary) hypertension: Secondary | ICD-10-CM

## 2023-03-06 ENCOUNTER — Ambulatory Visit: Payer: Medicare PPO | Admitting: Nurse Practitioner

## 2023-03-06 ENCOUNTER — Encounter: Payer: Self-pay | Admitting: Nurse Practitioner

## 2023-03-06 VITALS — BP 142/78 | HR 59 | Temp 97.4°F | Resp 20 | Ht 64.0 in | Wt 154.0 lb

## 2023-03-06 DIAGNOSIS — E785 Hyperlipidemia, unspecified: Secondary | ICD-10-CM | POA: Diagnosis not present

## 2023-03-06 DIAGNOSIS — Z6826 Body mass index (BMI) 26.0-26.9, adult: Secondary | ICD-10-CM

## 2023-03-06 DIAGNOSIS — R739 Hyperglycemia, unspecified: Secondary | ICD-10-CM | POA: Diagnosis not present

## 2023-03-06 DIAGNOSIS — Z6832 Body mass index (BMI) 32.0-32.9, adult: Secondary | ICD-10-CM

## 2023-03-06 DIAGNOSIS — I10 Essential (primary) hypertension: Secondary | ICD-10-CM

## 2023-03-06 LAB — CBC WITH DIFFERENTIAL/PLATELET
Basophils Absolute: 0.1 10*3/uL (ref 0.0–0.2)
Basos: 1 %
EOS (ABSOLUTE): 0.1 10*3/uL (ref 0.0–0.4)
Eos: 1 %
Hematocrit: 45.5 % (ref 34.0–46.6)
Hemoglobin: 15 g/dL (ref 11.1–15.9)
Immature Grans (Abs): 0 10*3/uL (ref 0.0–0.1)
Immature Granulocytes: 0 %
Lymphocytes Absolute: 1.9 10*3/uL (ref 0.7–3.1)
Lymphs: 18 %
MCH: 28.9 pg (ref 26.6–33.0)
MCHC: 33 g/dL (ref 31.5–35.7)
MCV: 88 fL (ref 79–97)
Monocytes Absolute: 0.6 10*3/uL (ref 0.1–0.9)
Monocytes: 6 %
Neutrophils Absolute: 7.6 10*3/uL — ABNORMAL HIGH (ref 1.4–7.0)
Neutrophils: 74 %
Platelets: 289 10*3/uL (ref 150–450)
RBC: 5.19 x10E6/uL (ref 3.77–5.28)
RDW: 13.2 % (ref 11.7–15.4)
WBC: 10.3 10*3/uL (ref 3.4–10.8)

## 2023-03-06 LAB — LIPID PANEL

## 2023-03-06 LAB — CMP14+EGFR: Albumin: 4.5 g/dL (ref 3.8–4.8)

## 2023-03-06 MED ORDER — HYDROCHLOROTHIAZIDE 25 MG PO TABS: 25.0000 mg | ORAL_TABLET | Freq: Every day | ORAL | 1 refills | Status: DC

## 2023-03-06 MED ORDER — CLONIDINE HCL 0.3 MG PO TABS: 0.3000 mg | ORAL_TABLET | Freq: Two times a day (BID) | ORAL | 1 refills | Status: DC

## 2023-03-06 MED ORDER — LISINOPRIL 40 MG PO TABS: 40.0000 mg | ORAL_TABLET | Freq: Every day | ORAL | 1 refills | Status: DC

## 2023-03-06 MED ORDER — METOPROLOL TARTRATE 100 MG PO TABS: ORAL_TABLET | ORAL | 1 refills | Status: DC

## 2023-03-06 MED ORDER — SIMVASTATIN 40 MG PO TABS: ORAL_TABLET | ORAL | 1 refills | Status: DC

## 2023-03-06 NOTE — Progress Notes (Signed)
Subjective:    Patient ID: Deanna Becker, female    DOB: October 30, 1945, 77 y.o.   MRN: [id number]   Chief Complaint: medical management of chronic issues     HPI:  Deanna Becker is a 77 y.o. who identifies as a female who was assigned female at birth.   Social history: Lives with: husband  Work history: retired   Water engineer in today for follow up of the following chronic medical issues:  1. Primary hypertension No c/o chest pain, sob or headache. Does not check blood pressure at home. BP Readings from Last 3 Encounters:  03/06/23 (!) 179/83  09/05/22 (!) 141/80  02/17/22 (!) 154/80      2. Hyperlipidemia with target LDL less than 100 Does not watch diet and no dedicated exercise. Lab Results  Component Value Date   CHOL 142 09/05/2022   HDL 56 09/05/2022   LDLCALC 68 09/05/2022   TRIG 100 09/05/2022   CHOLHDL 2.5 09/05/2022  The 10-year ASCVD risk score (Arnett DK, et al., 2019) is: 24.6%     3. BMI 32.0-32.9,adult No recent weight changes Wt Readings from Last 3 Encounters:  03/06/23 154 lb (69.9 kg)  09/05/22 154 lb (69.9 kg)  08/06/22 156 lb (70.8 kg)   BMI Readings from Last 3 Encounters:  03/06/23 26.43 kg/m  09/05/22 26.43 kg/m  08/06/22 26.78 kg/m     New complaints: None  today  Allergies  Allergen Reactions   Amlodipine Swelling   Sulfa Antibiotics    Outpatient Encounter Medications as of 03/06/2023  Medication Sig   aspirin EC 81 MG tablet Take 1 tablet (81 mg total) by mouth daily.   Calcium Carbonate (CALCIUM 500 PO) Take by mouth.   Cholecalciferol (VITAMIN D3 PO) Take by mouth.   cloNIDine (CATAPRES) 0.3 MG tablet TAKE 1 TABLET BY MOUTH TWICE DAILY   hydrochlorothiazide (HYDRODIURIL) 25 MG tablet Take 1 tablet (25 mg total) by mouth daily.   lisinopril (ZESTRIL) 40 MG tablet Take 1 tablet (40 mg total) by mouth daily.   metoprolol tartrate (LOPRESSOR) 100 MG tablet TAKE 1 TABLET BY MOUTH TWICE DAILY   simvastatin (ZOCOR) 40 MG  tablet TAKE 1 TABLET(40 MG) BY MOUTH DAILY AT 6 PM   ZINC OXIDE PO Take by mouth.   No facility-administered encounter medications on file as of 03/06/2023.    Past Surgical History:  Procedure Laterality Date   gangloin cyst      Family History  Problem Relation Age of Onset   Asthma Mother    Heart disease Father       Controlled substance contract: n/a     Review of Systems  Constitutional:  Negative for diaphoresis.  Eyes:  Negative for pain.  Respiratory:  Negative for shortness of breath.   Cardiovascular:  Negative for chest pain, palpitations and leg swelling.  Gastrointestinal:  Negative for abdominal pain.  Endocrine: Negative for polydipsia.  Skin:  Negative for rash.  Neurological:  Negative for dizziness, weakness and headaches.  Hematological:  Does not bruise/bleed easily.  All other systems reviewed and are negative.      Objective:   Physical Exam Vitals and nursing note reviewed.  Constitutional:      General: She is not in acute distress.    Appearance: Normal appearance. She is well-developed.  HENT:     Head: Normocephalic.     Right Ear: Tympanic membrane normal.     Left Ear: Tympanic membrane normal.     Nose: Nose normal.  Mouth/Throat:     Mouth: Mucous membranes are moist.  Eyes:     Pupils: Pupils are equal, round, and reactive to light.  Neck:     Vascular: No carotid bruit or JVD.  Cardiovascular:     Rate and Rhythm: Normal rate and regular rhythm.     Heart sounds: Normal heart sounds.  Pulmonary:     Effort: Pulmonary effort is normal. No respiratory distress.     Breath sounds: Normal breath sounds. No wheezing or rales.  Chest:     Chest wall: No tenderness.  Abdominal:     General: Bowel sounds are normal. There is no distension or abdominal bruit.     Palpations: Abdomen is soft. There is no hepatomegaly, splenomegaly, mass or pulsatile mass.     Tenderness: There is no abdominal tenderness.  Musculoskeletal:         General: Normal range of motion.     Cervical back: Normal range of motion and neck supple.  Lymphadenopathy:     Cervical: No cervical adenopathy.  Skin:    General: Skin is warm and dry.  Neurological:     Mental Status: She is alert and oriented to person, place, and time.     Deep Tendon Reflexes: Reflexes are normal and symmetric.  Psychiatric:        Behavior: Behavior normal.        Thought Content: Thought content normal.        Judgment: Judgment normal.    BP (!) 142/78   Pulse (!) 59   Temp (!) 97.4 F (36.3 C) (Temporal)   Resp 20   Ht 5\' 4"  (1.626 m)   Wt 154 lb (69.9 kg)   SpO2 96%   BMI 26.43 kg/m         Assessment & Plan:   Deanna Becker comes in today with chief complaint of Medical Management of Chronic Issues   Diagnosis and orders addressed:  1. Primary hypertension Low sodium diet - cloNIDine (CATAPRES) 0.3 MG tablet; Take 1 tablet (0.3 mg total) by mouth 2 (two) times daily.  Dispense: 180 tablet; Refill: 1 - hydrochlorothiazide (HYDRODIURIL) 25 MG tablet; Take 1 tablet (25 mg total) by mouth daily.  Dispense: 90 tablet; Refill: 1 - lisinopril (ZESTRIL) 40 MG tablet; Take 1 tablet (40 mg total) by mouth daily.  Dispense: 90 tablet; Refill: 1 - metoprolol tartrate (LOPRESSOR) 100 MG tablet; TAKE 1 TABLET BY MOUTH TWICE DAILY  Dispense: 180 tablet; Refill: 1 - CBC with Differential/Platelet - CMP14+EGFR  2. Hyperlipidemia with target LDL less than 100 Low fat diet - simvastatin (ZOCOR) 40 MG tablet; TAKE 1 TABLET(40 MG) BY MOUTH DAILY AT 6 PM  Dispense: 90 tablet; Refill: 1 - Lipid panel  3. BMI 32.0-32.9,adult Discussed diet and exercise for person with BMI >25 Will recheck weight in 3-6 months    Labs pending Health Maintenance reviewed Diet and exercise encouraged  Follow up plan: 6 months   Deanna Daphine Deutscher, FNP

## 2023-03-06 NOTE — Patient Instructions (Signed)

## 2023-03-07 LAB — CMP14+EGFR: Creatinine, Ser: 0.76 mg/dL (ref 0.57–1.00)

## 2023-03-11 LAB — HGB A1C W/O EAG: Hgb A1c MFr Bld: 6 % — ABNORMAL HIGH (ref 4.8–5.6)

## 2023-03-11 LAB — SPECIMEN STATUS REPORT

## 2023-08-21 ENCOUNTER — Ambulatory Visit (INDEPENDENT_AMBULATORY_CARE_PROVIDER_SITE_OTHER): Payer: Medicare PPO

## 2023-08-21 VITALS — Ht 64.0 in | Wt 154.0 lb

## 2023-08-21 DIAGNOSIS — Z0000 Encounter for general adult medical examination without abnormal findings: Secondary | ICD-10-CM | POA: Diagnosis not present

## 2023-08-21 NOTE — Patient Instructions (Signed)
 Deanna Becker , Thank you for taking time to come for your Medicare Wellness Visit. I appreciate your ongoing commitment to your health goals. Please review the following plan we discussed and let me know if I can assist you in the future.   Referrals/Orders/Follow-Ups/Clinician Recommendations: Aim for 30 minutes of exercise or brisk walking, 6-8 glasses of water, and 5 servings of fruits and vegetables each day.  This is a list of the screening recommended for you and due dates:  Health Maintenance  Topic Date Due   COVID-19 Vaccine (4 - 2024-25 season) 04/12/2023   DTaP/Tdap/Td vaccine (2 - Td or Tdap) 09/01/2023   Medicare Annual Wellness Visit  08/20/2024   Pneumonia Vaccine  Completed   Flu Shot  Completed   DEXA scan (bone density measurement)  Completed   Hepatitis C Screening  Completed   Zoster (Shingles) Vaccine  Completed   HPV Vaccine  Aged Out   Cologuard (Stool DNA test)  Discontinued    Advanced directives: (ACP Link)Information on Advanced Care Planning can be found at [place]  Secretary of [facility] Advance Health Care Directives Advance Health Care Directives ([email or web address]   Next Medicare Annual Wellness Visit scheduled for next year: Yes

## 2023-08-21 NOTE — Progress Notes (Signed)
 Subjective:   Deanna Becker is a 78 y.o. female who presents for Medicare Annual (Subsequent) preventive examination.  Visit Complete: Virtual I connected with  Deanna Becker on 08/21/23 by a audio enabled telemedicine application and verified that I am speaking with the correct person using two identifiers.  Patient Location: Home  Provider Location: Home Office  This patient declined Interactive audio and video telecommunications. Therefore the visit was completed with audio only.  I discussed the limitations of evaluation and management by telemedicine. The patient expressed understanding and agreed to proceed.  Vital Signs: Because this visit was a virtual/telehealth visit, some criteria may be missing or patient reported. Any vitals not documented were not able to be obtained and vitals that have been documented are patient reported.  Cardiac Risk Factors include: advanced age (>48men, >59 women);dyslipidemia;hypertension     Objective:    Today's Vitals   08/21/23 1338  Weight: 154 lb (69.9 kg)  Height: 5' 4 (1.626 m)   Body mass index is 26.43 kg/m.     08/21/2023    1:42 PM 08/06/2022   11:48 AM 07/31/2021   11:48 AM 01/09/2018    4:39 PM 11/18/2012   10:46 PM  Advanced Directives  Does Patient Have a Medical Advance Directive? No No No No Patient does not have advance directive;Patient would not like information  Would patient like information on creating a medical advance directive? Yes (MAU/Ambulatory/Procedural Areas - Information given) No - Patient declined No - Patient declined    Pre-existing out of facility DNR order (yellow form or pink MOST form)     No    Current Medications (verified) Outpatient Encounter Medications as of 08/21/2023  Medication Sig   aspirin  EC 81 MG tablet Take 1 tablet (81 mg total) by mouth daily.   Calcium  Carbonate (CALCIUM  500 PO) Take by mouth.   Cholecalciferol (VITAMIN D3 PO) Take by mouth.   cloNIDine  (CATAPRES ) 0.3 MG  tablet Take 1 tablet (0.3 mg total) by mouth 2 (two) times daily.   hydrochlorothiazide  (HYDRODIURIL ) 25 MG tablet Take 1 tablet (25 mg total) by mouth daily.   lisinopril  (ZESTRIL ) 40 MG tablet Take 1 tablet (40 mg total) by mouth daily.   metoprolol  tartrate (LOPRESSOR ) 100 MG tablet TAKE 1 TABLET BY MOUTH TWICE DAILY   simvastatin  (ZOCOR ) 40 MG tablet TAKE 1 TABLET(40 MG) BY MOUTH DAILY AT 6 PM   ZINC OXIDE PO Take by mouth.   No facility-administered encounter medications on file as of 08/21/2023.    Allergies (verified) Amlodipine  and Sulfa antibiotics   History: Past Medical History:  Diagnosis Date   History of pneumonia    Hyperlipidemia    Hypertension    Past Surgical History:  Procedure Laterality Date   gangloin cyst     Family History  Problem Relation Age of Onset   Asthma Mother    Heart disease Father    Social History   Socioeconomic History   Marital status: Married    Spouse name: Sharolyn   Number of children: 0   Years of education: Not on file   Highest education level: Not on file  Occupational History   Not on file  Tobacco Use   Smoking status: Never   Smokeless tobacco: Never  Substance and Sexual Activity   Alcohol use: Yes    Comment: occasional   Drug use: No   Sexual activity: Not on file  Other Topics Concern   Not on file  Social History Narrative  Married x 50 03/2021.   Social Drivers of Corporate Investment Banker Strain: Low Risk  (08/21/2023)   Overall Financial Resource Strain (CARDIA)    Difficulty of Paying Living Expenses: Not hard at all  Food Insecurity: No Food Insecurity (08/21/2023)   Hunger Vital Sign    Worried About Running Out of Food in the Last Year: Never true    Ran Out of Food in the Last Year: Never true  Transportation Needs: No Transportation Needs (08/21/2023)   PRAPARE - Administrator, Civil Service (Medical): No    Lack of Transportation (Non-Medical): No  Physical Activity: Sufficiently  Active (08/21/2023)   Exercise Vital Sign    Days of Exercise per Week: 5 days    Minutes of Exercise per Session: 50 min  Stress: No Stress Concern Present (08/21/2023)   [employer] of Occupational Health - Occupational Stress Questionnaire    Feeling of Stress : Not at all  Social Connections: Socially Integrated (08/21/2023)   Social Connection and Isolation Panel [NHANES]    Frequency of Communication with Friends and Family: More than three times a week    Frequency of Social Gatherings with Friends and Family: Three times a week    Attends Religious Services: More than 4 times per year    Active Member of Clubs or Organizations: Yes    Attends Banker Meetings: 1 to 4 times per year    Marital Status: Married    Tobacco Counseling Counseling given: Not Answered   Clinical Intake:  Pre-visit preparation completed: Yes  Pain : No/denies pain     Diabetes: No  How often do you need to have someone help you when you read instructions, pamphlets, or other written materials from your doctor or pharmacy?: 1 - Never  Interpreter Needed?: No  Information entered by :: Charmaine Bloodgood LPN   Activities of Daily Living    08/21/2023    1:42 PM  In your present state of health, do you have any difficulty performing the following activities:  Hearing? 0  Vision? 0  Difficulty concentrating or making decisions? 0  Walking or climbing stairs? 0  Dressing or bathing? 0  Doing errands, shopping? 0  Preparing Food and eating ? N  Using the Toilet? N  In the past six months, have you accidently leaked urine? N  Do you have problems with loss of bowel control? N  Managing your Medications? N  Managing your Finances? N  Housekeeping or managing your Housekeeping? N    Patient Care Team: Gladis Mustard, FNP as PCP - General (Nurse Practitioner)  Indicate any recent Medical Services you may have received from other than [facility] providers in the past year  (date may be approximate).     Assessment:   This is a routine wellness examination for Deanna Becker.  Hearing/Vision screen Hearing Screening - Comments:: Denies hearing difficulties   Vision Screening - Comments:: No vision problems; will schedule routine eye exam soon     Goals Addressed   None   Depression Screen    08/21/2023    1:40 PM 03/06/2023    8:16 AM 09/05/2022    8:02 AM 08/06/2022   11:50 AM 08/06/2022   11:49 AM 02/17/2022    8:06 AM 08/16/2021    7:58 AM  PHQ 2/9 Scores  PHQ - 2 Score 0 0 0 0 0 0 0  PHQ- 9 Score  0 0   0 0    Fall  Risk    08/21/2023    1:41 PM 03/06/2023    8:16 AM 09/05/2022    8:01 AM 08/06/2022   11:49 AM 02/17/2022    8:06 AM  Fall Risk   Falls in the past year? 0 0 0 0 0  Number falls in past yr: 0   0   Injury with Fall? 0   0   Risk for fall due to : No Fall Risks   Impaired vision   Follow up Falls prevention discussed;Education provided;Falls evaluation completed   Falls prevention discussed     MEDICARE RISK AT HOME: Medicare Risk at Home Any stairs in or around the home?: No If so, are there any without handrails?: No Home free of loose throw rugs in walkways, pet beds, electrical cords, etc?: Yes Adequate lighting in your home to reduce risk of falls?: Yes Life alert?: No Use of a cane, walker or w/c?: No Grab bars in the bathroom?: Yes Shower chair or bench in shower?: No Elevated toilet seat or a handicapped toilet?: Yes  TIMED UP AND GO:  Was the test performed?  No    Cognitive Function:        08/21/2023    1:42 PM 08/06/2022   11:51 AM 07/31/2021   11:50 AM  6CIT Screen  What Year? 0 points 0 points 0 points  What month? 0 points 0 points 0 points  What time? 0 points 0 points 0 points  Count back from 20 0 points 0 points 0 points  Months in reverse 0 points 0 points 0 points  Repeat phrase 0 points 0 points 0 points  Total Score 0 points 0 points 0 points    Immunizations Immunization History   Administered Date(s) Administered   Fluad Quad(high Dose 65+) 06/07/2019, 06/18/2020, 04/29/2021   Fluad Trivalent(High Dose 65+) 06/13/2023   Influenza, High Dose Seasonal PF 07/17/2014, 05/25/2015, 09/09/2017   Influenza,inj,Quad PF,6+ Mos 05/30/2013, 06/24/2018   Influenza-Unspecified 06/13/2022   PFIZER(Purple Top)SARS-COV-2 Vaccination 09/02/2019, 09/23/2019, 06/07/2020   Pneumococcal Conjugate-13 05/30/2013   Pneumococcal Polysaccharide-23 11/16/2014   Tdap 08/31/2013   Zoster Recombinant(Shingrix ) 08/16/2021, 02/17/2022   Zoster, Live 08/31/2013    TDAP status: Up to date  Flu Vaccine status: Up to date  Pneumococcal vaccine status: Up to date  Covid-19 vaccine status: Information provided on how to obtain vaccines.   Qualifies for Shingles Vaccine? Yes   Zostavax completed Yes   Shingrix  Completed?: Yes  Screening Tests Health Maintenance  Topic Date Due   COVID-19 Vaccine (4 - 2024-25 season) 04/12/2023   DTaP/Tdap/Td (2 - Td or Tdap) 09/01/2023   Medicare Annual Wellness (AWV)  08/20/2024   Pneumonia Vaccine 31+ Years old  Completed   INFLUENZA VACCINE  Completed   DEXA SCAN  Completed   Hepatitis C Screening  Completed   Zoster Vaccines- Shingrix   Completed   HPV VACCINES  Aged Out   Fecal DNA (Cologuard)  Discontinued    Health Maintenance  Health Maintenance Due  Topic Date Due   COVID-19 Vaccine (4 - 2024-25 season) 04/12/2023    Colorectal cancer screening: No longer required.   Mammogram status: No longer required due to age and preference.  Bone Density status: Completed 02/17/22. Results reflect: Bone density results: OSTEOPENIA. Repeat every 2 years.  Lung Cancer Screening: (Low Dose CT Chest recommended if Age 92-80 years, 20 pack-year currently smoking OR have quit w/in 15years.) does not qualify.   Lung Cancer Screening Referral: n/a  Additional Screening:  Hepatitis C Screening: does qualify; Completed 01/17/16  Vision Screening:  Recommended annual ophthalmology exams for early detection of glaucoma and other disorders of the eye. Is the patient up to date with their annual eye exam?  No  Who is the provider or what is the name of the office in which the patient attends annual eye exams? none If pt is not established with a provider, would they like to be referred to a provider to establish care? No .   Dental Screening: Recommended annual dental exams for proper oral hygiene  Community Resource Referral / Chronic Care Management: CRR required this visit?  No   CCM required this visit?  No     Plan:     I have personally reviewed and noted the following in the patient's chart:   Medical and social history Use of alcohol, tobacco or illicit drugs  Current medications and supplements including opioid prescriptions. Patient is not currently taking opioid prescriptions. Functional ability and status Nutritional status Physical activity Advanced directives List of other physicians Hospitalizations, surgeries, and ER visits in previous 12 months Vitals Screenings to include cognitive, depression, and falls Referrals and appointments  In addition, I have reviewed and discussed with patient certain preventive protocols, quality metrics, and best practice recommendations. A written personalized care plan for preventive services as well as general preventive health recommendations were provided to patient.     Lavelle Pfeiffer [place], [place]   8/89/[id number]   After Visit Summary: (MyChart) Due to this being a telephonic visit, the after visit summary with patients personalized plan was offered to patient via MyChart   Nurse Notes: No concerns at this time

## 2023-09-07 ENCOUNTER — Other Ambulatory Visit: Payer: Self-pay | Admitting: Nurse Practitioner

## 2023-09-07 DIAGNOSIS — I10 Essential (primary) hypertension: Secondary | ICD-10-CM

## 2023-09-08 ENCOUNTER — Ambulatory Visit: Payer: Medicare PPO | Admitting: Nurse Practitioner

## 2023-09-08 ENCOUNTER — Encounter: Payer: Self-pay | Admitting: Nurse Practitioner

## 2023-09-08 VITALS — BP 150/76 | HR 67 | Temp 97.6°F | Ht 64.0 in | Wt 157.0 lb

## 2023-09-08 DIAGNOSIS — I10 Essential (primary) hypertension: Secondary | ICD-10-CM | POA: Diagnosis not present

## 2023-09-08 DIAGNOSIS — E785 Hyperlipidemia, unspecified: Secondary | ICD-10-CM | POA: Diagnosis not present

## 2023-09-08 DIAGNOSIS — R739 Hyperglycemia, unspecified: Secondary | ICD-10-CM

## 2023-09-08 DIAGNOSIS — Z6832 Body mass index (BMI) 32.0-32.9, adult: Secondary | ICD-10-CM

## 2023-09-08 LAB — LIPID PANEL

## 2023-09-08 LAB — BAYER DCA HB A1C WAIVED: HB A1C (BAYER DCA - WAIVED): 5.7 % — ABNORMAL HIGH (ref 4.8–5.6)

## 2023-09-08 MED ORDER — LISINOPRIL 40 MG PO TABS
40.0000 mg | ORAL_TABLET | Freq: Every day | ORAL | 1 refills | Status: DC
Start: 2023-09-08 — End: 2024-03-10

## 2023-09-08 MED ORDER — HYDROCHLOROTHIAZIDE 25 MG PO TABS
25.0000 mg | ORAL_TABLET | Freq: Every day | ORAL | 1 refills | Status: DC
Start: 2023-09-08 — End: 2024-03-14

## 2023-09-08 MED ORDER — CLONIDINE HCL 0.3 MG PO TABS
0.3000 mg | ORAL_TABLET | Freq: Two times a day (BID) | ORAL | 1 refills | Status: DC
Start: 2023-09-08 — End: 2024-03-10

## 2023-09-08 MED ORDER — SIMVASTATIN 40 MG PO TABS
ORAL_TABLET | ORAL | 1 refills | Status: DC
Start: 2023-09-08 — End: 2024-03-14

## 2023-09-08 NOTE — Patient Instructions (Signed)
Exercising to Stay Healthy To become healthy and stay healthy, it is recommended that you do moderate-intensity and vigorous-intensity exercise. You can tell that you are exercising at a moderate intensity if your heart starts beating faster and you start breathing faster but can still hold a conversation. You can tell that you are exercising at a vigorous intensity if you are breathing much harder and faster and cannot hold a conversation while exercising. How can exercise benefit me? Exercising regularly is important. It has many health benefits, such as: Improving overall fitness, flexibility, and endurance. Increasing bone density. Helping with weight control. Decreasing body fat. Increasing muscle strength and endurance. Reducing stress and tension, anxiety, depression, or anger. Improving overall health. What guidelines should I follow while exercising? Before you start a new exercise program, talk with your health care provider. Do not exercise so much that you hurt yourself, feel dizzy, or get very short of breath. Wear comfortable clothes and wear shoes with good support. Drink plenty of water while you exercise to prevent dehydration or heat stroke. Work out until your breathing and your heartbeat get faster (moderate intensity). How often should I exercise? Choose an activity that you enjoy, and set realistic goals. Your health care provider can help you make an activity plan that is individually designed and works best for you. Exercise regularly as told by your health care provider. This may include: Doing strength training two times a week, such as: Lifting weights. Using resistance bands. Push-ups. Sit-ups. Yoga. Doing a certain intensity of exercise for a given amount of time. Choose from these options: A total of 150 minutes of moderate-intensity exercise every week. A total of 75 minutes of vigorous-intensity exercise every week. A mix of moderate-intensity and  vigorous-intensity exercise every week. Children, pregnant women, people who have not exercised regularly, people who are overweight, and older adults may need to talk with a health care provider about what activities are safe to perform. If you have a medical condition, be sure to talk with your health care provider before you start a new exercise program. What are some exercise ideas? Moderate-intensity exercise ideas include: Walking 1 mile (1.6 km) in about 15 minutes. Biking. Hiking. Golfing. Dancing. Water aerobics. Vigorous-intensity exercise ideas include: Walking 4.5 miles (7.2 km) or more in about 1 hour. Jogging or running 5 miles (8 km) in about 1 hour. Biking 10 miles (16.1 km) or more in about 1 hour. Lap swimming. Roller-skating or in-line skating. Cross-country skiing. Vigorous competitive sports, such as football, basketball, and soccer. Jumping rope. Aerobic dancing. What are some everyday activities that can help me get exercise? Yard work, such as: Child psychotherapist. Raking and bagging leaves. Washing your car. Pushing a stroller. Shoveling snow. Gardening. Washing windows or floors. How can I be more active in my day-to-day activities? Use stairs instead of an elevator. Take a walk during your lunch break. If you drive, park your car farther away from your work or school. If you take public transportation, get off one stop early and walk the rest of the way. Stand up or walk around during all of your indoor phone calls. Get up, stretch, and walk around every 30 minutes throughout the day. Enjoy exercise with a friend. Support to continue exercising will help you keep a regular routine of activity. Where to find more information You can find more information about exercising to stay healthy from: [place] Department of Health and Human Services: ThisPath.fi Centers for Disease Control and Prevention (  CDC): [employer]Ex[employer].br Summary Exercising regularly is  important. It will improve your overall fitness, flexibility, and endurance. Regular exercise will also improve your overall health. It can help you control your weight, reduce stress, and improve your bone density. Do not exercise so much that you hurt yourself, feel dizzy, or get very short of breath. Before you start a new exercise program, talk with your health care provider. This information is not intended to replace advice given to you by your health care provider. Make sure you discuss any questions you have with your health care provider. Document Revised: 11/23/2020 Document Reviewed: 11/23/2020 Elsevier Patient Education  2024 [employer].

## 2023-09-08 NOTE — Progress Notes (Signed)
Subjective:    Patient ID: Deanna Becker, female    DOB: 12-11-1945, 78 y.o.   MRN: [id number]   Chief Complaint: medical management of chronic issues     HPI:  Deanna Becker is a 78 y.o. who identifies as a female who was assigned female at birth.   Social history: Lives with: husband  Work history: retired   Water engineer in today for follow up of the following chronic medical issues:  1. Primary hypertension No c/o chest pain, sob or headache. Does not check blood pressure at home. BP Readings from Last 3 Encounters:  03/06/23 (!) 142/78  09/05/22 (!) 141/80  02/17/22 (!) 154/80      2. Hyperlipidemia with target LDL less than 100 Does not watch diet and no dedicated exercise. Lab Results  Component Value Date   CHOL 174 03/06/2023   HDL 69 03/06/2023   LDLCALC 85 03/06/2023   TRIG 116 03/06/2023   CHOLHDL 2.5 03/06/2023  The ASCVD Risk score (Arnett DK, et al., 2019) failed to calculate for the following reasons:   The systolic blood pressure is missing     3. BMI 32.0-32.9,adult Weight is up 3 lbs  Wt Readings from Last 3 Encounters:  09/08/23 157 lb (71.2 kg)  08/21/23 154 lb (69.9 kg)  03/06/23 154 lb (69.9 kg)   BMI Readings from Last 3 Encounters:  09/08/23 26.95 kg/m  08/21/23 26.43 kg/m  03/06/23 26.43 kg/m      New complaints: None  today  Allergies  Allergen Reactions   Amlodipine Swelling   Sulfa Antibiotics    Outpatient Encounter Medications as of 09/08/2023  Medication Sig   aspirin EC 81 MG tablet Take 1 tablet (81 mg total) by mouth daily.   Calcium Carbonate (CALCIUM 500 PO) Take by mouth.   Cholecalciferol (VITAMIN D3 PO) Take by mouth.   cloNIDine (CATAPRES) 0.3 MG tablet Take 1 tablet (0.3 mg total) by mouth 2 (two) times daily.   hydrochlorothiazide (HYDRODIURIL) 25 MG tablet Take 1 tablet (25 mg total) by mouth daily.   lisinopril (ZESTRIL) 40 MG tablet Take 1 tablet (40 mg total) by mouth daily.   metoprolol tartrate  (LOPRESSOR) 100 MG tablet TAKE 1 TABLET BY MOUTH TWICE DAILY   simvastatin (ZOCOR) 40 MG tablet TAKE 1 TABLET(40 MG) BY MOUTH DAILY AT 6 PM   ZINC OXIDE PO Take by mouth.   No facility-administered encounter medications on file as of 09/08/2023.    Past Surgical History:  Procedure Laterality Date   gangloin cyst      Family History  Problem Relation Age of Onset   Asthma Mother    Heart disease Father       Controlled substance contract: n/a     Review of Systems  Constitutional:  Negative for diaphoresis.  Eyes:  Negative for pain.  Respiratory:  Negative for shortness of breath.   Cardiovascular:  Negative for chest pain, palpitations and leg swelling.  Gastrointestinal:  Negative for abdominal pain.  Endocrine: Negative for polydipsia.  Skin:  Negative for rash.  Neurological:  Negative for dizziness, weakness and headaches.  Hematological:  Does not bruise/bleed easily.  All other systems reviewed and are negative.      Objective:   Physical Exam Vitals and nursing note reviewed.  Constitutional:      General: She is not in acute distress.    Appearance: Normal appearance. She is well-developed.  HENT:     Head: Normocephalic.     Right  Ear: Tympanic membrane normal.     Left Ear: Tympanic membrane normal.     Nose: Nose normal.     Mouth/Throat:     Mouth: Mucous membranes are moist.  Eyes:     Pupils: Pupils are equal, round, and reactive to light.  Neck:     Vascular: No carotid bruit or JVD.  Cardiovascular:     Rate and Rhythm: Normal rate and regular rhythm.     Heart sounds: Normal heart sounds.  Pulmonary:     Effort: Pulmonary effort is normal. No respiratory distress.     Breath sounds: Normal breath sounds. No wheezing or rales.  Chest:     Chest wall: No tenderness.  Abdominal:     General: Bowel sounds are normal. There is no distension or abdominal bruit.     Palpations: Abdomen is soft. There is no hepatomegaly, splenomegaly, mass  or pulsatile mass.     Tenderness: There is no abdominal tenderness.  Musculoskeletal:        General: Normal range of motion.     Cervical back: Normal range of motion and neck supple.  Lymphadenopathy:     Cervical: No cervical adenopathy.  Skin:    General: Skin is warm and dry.  Neurological:     Mental Status: She is alert and oriented to person, place, and time.     Deep Tendon Reflexes: Reflexes are normal and symmetric.  Psychiatric:        Behavior: Behavior normal.        Thought Content: Thought content normal.        Judgment: Judgment normal.     BP (!) 150/76 (BP Location: Right Arm, Cuff Size: Normal)   Pulse 67   Temp 97.6 F (36.4 C) (Temporal)   Ht 5\' 4"  (1.626 m)   Wt 157 lb (71.2 kg)   SpO2 97%   BMI 26.95 kg/m          Assessment & Plan:   Deanna Becker comes in today with chief complaint of No chief complaint on file.   Diagnosis and orders addressed:  1. Primary hypertension Low sodium diet - cloNIDine (CATAPRES) 0.3 MG tablet; Take 1 tablet (0.3 mg total) by mouth 2 (two) times daily.  Dispense: 180 tablet; Refill: 1 - hydrochlorothiazide (HYDRODIURIL) 25 MG tablet; Take 1 tablet (25 mg total) by mouth daily.  Dispense: 90 tablet; Refill: 1 - lisinopril (ZESTRIL) 40 MG tablet; Take 1 tablet (40 mg total) by mouth daily.  Dispense: 90 tablet; Refill: 1 - metoprolol tartrate (LOPRESSOR) 100 MG tablet; TAKE 1 TABLET BY MOUTH TWICE DAILY  Dispense: 180 tablet; Refill: 1 - CBC with Differential/Platelet - CMP14+EGFR  2. Hyperlipidemia with target LDL less than 100 Low fat diet - simvastatin (ZOCOR) 40 MG tablet; TAKE 1 TABLET(40 MG) BY MOUTH DAILY AT 6 PM  Dispense: 90 tablet; Refill: 1 - Lipid panel  3. BMI 32.0-32.9,adult Discussed diet and exercise for person with BMI >25 Will recheck weight in 3-6 months    Labs pending Health Maintenance reviewed Diet and exercise encouraged  Follow up plan: 6 months   Mary-Margaret Daphine Deutscher,  FNP

## 2023-09-08 NOTE — Addendum Note (Signed)
Addended by: Bennie Pierini on: 09/08/2023 08:52 AM   Modules accepted: Orders

## 2023-09-09 LAB — CBC WITH DIFFERENTIAL/PLATELET
Basophils Absolute: 0.1 10*3/uL (ref 0.0–0.2)
Basos: 1 %
EOS (ABSOLUTE): 0.1 10*3/uL (ref 0.0–0.4)
Eos: 1 %
Hematocrit: 46.8 % — ABNORMAL HIGH (ref 34.0–46.6)
Hemoglobin: 15 g/dL (ref 11.1–15.9)
Immature Grans (Abs): 0 10*3/uL (ref 0.0–0.1)
Immature Granulocytes: 0 %
Lymphocytes Absolute: 1.8 10*3/uL (ref 0.7–3.1)
Lymphs: 21 %
MCH: 29 pg (ref 26.6–33.0)
MCHC: 32.1 g/dL (ref 31.5–35.7)
MCV: 91 fL (ref 79–97)
Monocytes Absolute: 0.6 10*3/uL (ref 0.1–0.9)
Monocytes: 7 %
Neutrophils Absolute: 6.2 10*3/uL (ref 1.4–7.0)
Neutrophils: 70 %
Platelets: 283 10*3/uL (ref 150–450)
RBC: 5.17 x10E6/uL (ref 3.77–5.28)
RDW: 13.2 % (ref 11.7–15.4)
WBC: 8.8 10*3/uL (ref 3.4–10.8)

## 2023-09-09 LAB — LIPID PANEL
Cholesterol, Total: 164 mg/dL (ref 100–199)
HDL: 69 mg/dL (ref >39)
LDL CALC COMMENT:: 2.4 ratio (ref 0.0–4.4)
LDL Chol Calc (NIH): 77 mg/dL (ref 0–99)
Triglycerides: 100 mg/dL (ref 0–149)
VLDL Cholesterol Cal: 18 mg/dL (ref 5–40)

## 2023-09-09 LAB — CMP14+EGFR
ALT: 12 IU/L (ref 0–32)
AST: 13 IU/L (ref 0–40)
Albumin: 4.3 g/dL (ref 3.8–4.8)
Alkaline Phosphatase: 81 IU/L (ref 44–121)
BUN/Creatinine Ratio: 21 (ref 12–28)
BUN: 16 mg/dL (ref 8–27)
Bilirubin Total: 1 mg/dL (ref 0.0–1.2)
CO2: 22 mmol/L (ref 20–29)
Calcium: 9.8 mg/dL (ref 8.7–10.3)
Chloride: 100 mmol/L (ref 96–106)
Creatinine, Ser: 0.75 mg/dL (ref 0.57–1.00)
Globulin, Total: 2.5 g/dL (ref 1.5–4.5)
Glucose: 129 mg/dL — ABNORMAL HIGH (ref 70–99)
Potassium: 4.3 mmol/L (ref 3.5–5.2)
Sodium: 141 mmol/L (ref 134–144)
Total Protein: 6.8 g/dL (ref 6.0–8.5)
eGFR: 82 mL/min/{1.73_m2} (ref >59)

## 2023-12-03 ENCOUNTER — Other Ambulatory Visit: Payer: Self-pay | Admitting: Nurse Practitioner

## 2023-12-03 DIAGNOSIS — I10 Essential (primary) hypertension: Secondary | ICD-10-CM

## 2024-03-09 ENCOUNTER — Other Ambulatory Visit: Payer: Self-pay | Admitting: Nurse Practitioner

## 2024-03-09 DIAGNOSIS — I10 Essential (primary) hypertension: Secondary | ICD-10-CM

## 2024-03-10 ENCOUNTER — Other Ambulatory Visit: Payer: Self-pay | Admitting: Nurse Practitioner

## 2024-03-10 ENCOUNTER — Telehealth: Payer: Self-pay

## 2024-03-10 DIAGNOSIS — I10 Essential (primary) hypertension: Secondary | ICD-10-CM

## 2024-03-10 NOTE — Telephone Encounter (Signed)
 Copied from CRM [id number]. Topic: Clinical - Prescription Issue >> Mar 10, 2024  9:39 AM Ivette P wrote: Reason for CRM: Pt called in because pharmacy called pt and advised pt medication was denied, advsied pt that medication was approved and sent this morning.    cloNIDine  (CATAPRES ) 0.3 MG tablet 180 tablet 0 03/10/2024 -  Sig - Route: TAKE 1 TABLET BY MOUTH TWICE DAILY - Oral  Sent to pharmacy as: cloNIDine  (CATAPRES ) 0.3 MG tablet  E-Prescribing Status: Receipt confirmed by pharmacy (03/10/2024  8:47 AM EDT)   Pt will double check with pharmacy and call back if any issues.

## 2024-03-10 NOTE — Telephone Encounter (Signed)
 Noted will call back if needed

## 2024-03-11 ENCOUNTER — Ambulatory Visit: Payer: Medicare PPO | Admitting: Nurse Practitioner

## 2024-03-14 ENCOUNTER — Other Ambulatory Visit: Payer: Self-pay | Admitting: Nurse Practitioner

## 2024-03-14 DIAGNOSIS — I10 Essential (primary) hypertension: Secondary | ICD-10-CM

## 2024-03-14 DIAGNOSIS — E785 Hyperlipidemia, unspecified: Secondary | ICD-10-CM

## 2024-03-24 NOTE — Progress Notes (Signed)
 Subjective:    Patient ID: Deanna Becker, female    DOB: Nov 24, 1945, 78 y.o.   MRN: [id number]   Chief Complaint: annual physical    HPI:  Deanna Becker is a 78 y.o. who identifies as a female who was assigned female at birth.   Social history: Lives with: husband  Work history: retired   Water engineer in today for follow up of the following chronic medical issues:  1. Primary hypertension No c/o chest pain, sob or headache. Does not check blood pressure at home. BP Readings from Last 3 Encounters:  09/08/23 (!) 150/76  03/06/23 (!) 142/78  09/05/22 (!) 141/80      2. Hyperlipidemia with target LDL less than 100 Does not watch diet and no dedicated exercise. Lab Results  Component Value Date   CHOL 164 09/08/2023   HDL 69 09/08/2023   LDLCALC 77 09/08/2023   TRIG 100 09/08/2023   CHOLHDL 2.4 09/08/2023  The 10-year ASCVD risk score (Arnett DK, et al., 2019) is: 30.6%     3. BMI 26.0-26.9,adult Weight is up 3 lbs   Wt Readings from Last 3 Encounters:  03/25/24 158 lb (71.7 kg)  09/08/23 157 lb (71.2 kg)  08/21/23 154 lb (69.9 kg)   BMI Readings from Last 3 Encounters:  03/25/24 27.12 kg/m  09/08/23 26.95 kg/m  08/21/23 26.43 kg/m       New complaints: None  today  Allergies  Allergen Reactions   Amlodipine  Swelling   Sulfa Antibiotics    Outpatient Encounter Medications as of 03/25/2024  Medication Sig   aspirin  EC 81 MG tablet Take 1 tablet (81 mg total) by mouth daily.   Calcium  Carbonate (CALCIUM  500 PO) Take by mouth.   Cholecalciferol (VITAMIN D3 PO) Take by mouth.   cloNIDine  (CATAPRES ) 0.3 MG tablet TAKE 1 TABLET BY MOUTH TWICE DAILY   hydrochlorothiazide  (HYDRODIURIL ) 25 MG tablet TAKE 1 TABLET(25 MG) BY MOUTH DAILY   lisinopril  (ZESTRIL ) 40 MG tablet TAKE 1 TABLET(40 MG) BY MOUTH DAILY   metoprolol  tartrate (LOPRESSOR ) 100 MG tablet TAKE 1 TABLET BY MOUTH TWICE DAILY   simvastatin  (ZOCOR ) 40 MG tablet TAKE 1 TABLET(40 MG) BY MOUTH  DAILY AT 6 PM   ZINC OXIDE PO Take by mouth.   No facility-administered encounter medications on file as of 03/25/2024.    Past Surgical History:  Procedure Laterality Date   gangloin cyst      Family History  Problem Relation Age of Onset   Asthma Mother    Heart disease Father       Controlled substance contract: n/a     Review of Systems  Constitutional:  Negative for diaphoresis.  Eyes:  Negative for pain.  Respiratory:  Negative for shortness of breath.   Cardiovascular:  Negative for chest pain, palpitations and leg swelling.  Gastrointestinal:  Negative for abdominal pain.  Endocrine: Negative for polydipsia.  Skin:  Negative for rash.  Neurological:  Negative for dizziness, weakness and headaches.  Hematological:  Does not bruise/bleed easily.  All other systems reviewed and are negative.      Objective:   Physical Exam Vitals and nursing note reviewed.  Constitutional:      General: She is not in acute distress.    Appearance: Normal appearance. She is well-developed.  HENT:     Head: Normocephalic.     Right Ear: Tympanic membrane normal.     Left Ear: Tympanic membrane normal.     Nose: Nose normal.  Mouth/Throat:     Mouth: Mucous membranes are moist.  Eyes:     Pupils: Pupils are equal, round, and reactive to light.  Neck:     Vascular: No carotid bruit or JVD.  Cardiovascular:     Rate and Rhythm: Normal rate and regular rhythm.     Heart sounds: Normal heart sounds.  Pulmonary:     Effort: Pulmonary effort is normal. No respiratory distress.     Breath sounds: Normal breath sounds. No wheezing or rales.  Chest:     Chest wall: No tenderness.  Abdominal:     General: Bowel sounds are normal. There is no distension or abdominal bruit.     Palpations: Abdomen is soft. There is no hepatomegaly, splenomegaly, mass or pulsatile mass.     Tenderness: There is no abdominal tenderness.  Musculoskeletal:        General: Normal range of  motion.     Cervical back: Normal range of motion and neck supple.  Lymphadenopathy:     Cervical: No cervical adenopathy.  Skin:    General: Skin is warm and dry.  Neurological:     Mental Status: She is alert and oriented to person, place, and time.     Deep Tendon Reflexes: Reflexes are normal and symmetric.  Psychiatric:        Behavior: Behavior normal.        Thought Content: Thought content normal.        Judgment: Judgment normal.     BP (!) 146/78   Pulse 69   Temp 97.6 F (36.4 C) (Temporal)   Ht 5' 4 (1.626 m)   Wt 158 lb (71.7 kg)   SpO2 97%   BMI 27.12 kg/m    Hgba1c 5.9%         Assessment & Plan:   Deanna Becker comes in today with chief complaint of annual physical  Diagnosis and orders addressed:  1. Primary hypertension Low sodium diet Keep diary of blood pressure at home - cloNIDine  (CATAPRES ) 0.3 MG tablet; Take 1 tablet (0.3 mg total) by mouth 2 (two) times daily.  Dispense: 180 tablet; Refill: 1 - hydrochlorothiazide  (HYDRODIURIL ) 25 MG tablet; Take 1 tablet (25 mg total) by mouth daily.  Dispense: 90 tablet; Refill: 1 - lisinopril  (ZESTRIL ) 40 MG tablet; Take 1 tablet (40 mg total) by mouth daily.  Dispense: 90 tablet; Refill: 1 - metoprolol  tartrate (LOPRESSOR ) 100 MG tablet; TAKE 1 TABLET BY MOUTH TWICE DAILY  Dispense: 180 tablet; Refill: 1 - CBC with Differential/Platelet - CMP14+EGFR  2. Hyperlipidemia with target LDL less than 100 Low fat diet - simvastatin  (ZOCOR ) 40 MG tablet; TAKE 1 TABLET(40 MG) BY MOUTH DAILY AT 6 PM  Dispense: 90 tablet; Refill: 1 - Lipid panel  3. BMI 32.0-32.9,adult Discussed diet and exercise for person with BMI >25 Will recheck weight in 3-6 months    Labs pending Health Maintenance reviewed Diet and exercise encouraged  Follow up plan: 6 months   Mary-Margaret Gladis, FNP

## 2024-03-25 ENCOUNTER — Ambulatory Visit: Admitting: Nurse Practitioner

## 2024-03-25 ENCOUNTER — Encounter: Payer: Self-pay | Admitting: Nurse Practitioner

## 2024-03-25 VITALS — BP 146/78 | HR 69 | Temp 97.6°F | Ht 64.0 in | Wt 158.0 lb

## 2024-03-25 DIAGNOSIS — R739 Hyperglycemia, unspecified: Secondary | ICD-10-CM

## 2024-03-25 DIAGNOSIS — I10 Essential (primary) hypertension: Secondary | ICD-10-CM

## 2024-03-25 DIAGNOSIS — E785 Hyperlipidemia, unspecified: Secondary | ICD-10-CM

## 2024-03-25 DIAGNOSIS — Z6827 Body mass index (BMI) 27.0-27.9, adult: Secondary | ICD-10-CM

## 2024-03-25 DIAGNOSIS — Z0001 Encounter for general adult medical examination with abnormal findings: Secondary | ICD-10-CM

## 2024-03-25 DIAGNOSIS — Z0000 Encounter for general adult medical examination without abnormal findings: Secondary | ICD-10-CM

## 2024-03-25 LAB — BAYER DCA HB A1C WAIVED: HB A1C (BAYER DCA - WAIVED): 5.9 % — ABNORMAL HIGH (ref 4.8–5.6)

## 2024-03-25 MED ORDER — HYDROCHLOROTHIAZIDE 25 MG PO TABS: 25.0000 mg | ORAL_TABLET | Freq: Every day | ORAL | 1 refills | Status: DC

## 2024-03-25 MED ORDER — SIMVASTATIN 40 MG PO TABS: ORAL_TABLET | ORAL | 1 refills | Status: DC

## 2024-03-25 MED ORDER — METOPROLOL TARTRATE 100 MG PO TABS: 100.0000 mg | ORAL_TABLET | Freq: Two times a day (BID) | ORAL | 1 refills | Status: DC

## 2024-03-25 MED ORDER — CLONIDINE HCL 0.3 MG PO TABS: 0.3000 mg | ORAL_TABLET | Freq: Two times a day (BID) | ORAL | 1 refills | Status: DC

## 2024-03-25 MED ORDER — LISINOPRIL 40 MG PO TABS: 40.0000 mg | ORAL_TABLET | Freq: Every day | ORAL | 1 refills | Status: DC

## 2024-03-25 NOTE — Patient Instructions (Signed)

## 2024-03-26 LAB — CBC WITH DIFFERENTIAL/PLATELET
Basophils Absolute: 0.1 x10E3/uL (ref 0.0–0.2)
Basos: 1 %
EOS (ABSOLUTE): 0.1 x10E3/uL (ref 0.0–0.4)
Eos: 1 %
Hematocrit: 46.5 % (ref 34.0–46.6)
Hemoglobin: 15.3 g/dL (ref 11.1–15.9)
Immature Grans (Abs): 0 x10E3/uL (ref 0.0–0.1)
Immature Granulocytes: 0 %
Lymphocytes Absolute: 2.5 x10E3/uL (ref 0.7–3.1)
Lymphs: 30 %
MCH: 29.5 pg (ref 26.6–33.0)
MCHC: 32.9 g/dL (ref 31.5–35.7)
MCV: 90 fL (ref 79–97)
Monocytes Absolute: 0.6 x10E3/uL (ref 0.1–0.9)
Monocytes: 7 %
Neutrophils Absolute: 5.3 x10E3/uL (ref 1.4–7.0)
Neutrophils: 61 %
Platelets: 293 x10E3/uL (ref 150–450)
RBC: 5.19 x10E6/uL (ref 3.77–5.28)
RDW: 13.2 % (ref 11.7–15.4)
WBC: 8.6 x10E3/uL (ref 3.4–10.8)

## 2024-03-26 LAB — CMP14+EGFR
ALT: 15 IU/L (ref 0–32)
AST: 15 IU/L (ref 0–40)
Albumin: 4.6 g/dL (ref 3.8–4.8)
Alkaline Phosphatase: 78 IU/L (ref 44–121)
BUN/Creatinine Ratio: 18 (ref 12–28)
BUN: 13 mg/dL (ref 8–27)
Bilirubin Total: 1.6 mg/dL — ABNORMAL HIGH (ref 0.0–1.2)
CO2: 21 mmol/L (ref 20–29)
Calcium: 9.6 mg/dL (ref 8.7–10.3)
Chloride: 97 mmol/L (ref 96–106)
Creatinine, Ser: 0.71 mg/dL (ref 0.57–1.00)
Globulin, Total: 2.4 g/dL (ref 1.5–4.5)
Glucose: 111 mg/dL — ABNORMAL HIGH (ref 70–99)
Potassium: 4 mmol/L (ref 3.5–5.2)
Sodium: 136 mmol/L (ref 134–144)
Total Protein: 7 g/dL (ref 6.0–8.5)
eGFR: 88 mL/min/1.73 (ref >59)

## 2024-03-26 LAB — LIPID PANEL
Chol/HDL Ratio: 2.5 ratio (ref 0.0–4.4)
Cholesterol, Total: 160 mg/dL (ref 100–199)
HDL: 65 mg/dL (ref >39)
LDL Chol Calc (NIH): 76 mg/dL (ref 0–99)
Triglycerides: 106 mg/dL (ref 0–149)
VLDL Cholesterol Cal: 19 mg/dL (ref 5–40)

## 2024-03-28 ENCOUNTER — Ambulatory Visit: Payer: Self-pay | Admitting: Nurse Practitioner

## 2024-05-30 ENCOUNTER — Other Ambulatory Visit: Payer: Self-pay | Admitting: Nurse Practitioner

## 2024-05-30 DIAGNOSIS — I10 Essential (primary) hypertension: Secondary | ICD-10-CM

## 2024-06-11 ENCOUNTER — Other Ambulatory Visit: Payer: Self-pay | Admitting: Nurse Practitioner

## 2024-06-11 DIAGNOSIS — E785 Hyperlipidemia, unspecified: Secondary | ICD-10-CM

## 2024-06-22 ENCOUNTER — Other Ambulatory Visit: Payer: Self-pay | Admitting: *Deleted

## 2024-06-22 DIAGNOSIS — I10 Essential (primary) hypertension: Secondary | ICD-10-CM

## 2024-08-22 ENCOUNTER — Ambulatory Visit: Payer: Medicare PPO

## 2024-08-22 VITALS — BP 146/78 | HR 69 | Ht 64.0 in | Wt 158.0 lb

## 2024-08-22 DIAGNOSIS — Z13820 Encounter for screening for osteoporosis: Secondary | ICD-10-CM

## 2024-08-22 DIAGNOSIS — Z0000 Encounter for general adult medical examination without abnormal findings: Secondary | ICD-10-CM

## 2024-08-22 NOTE — Progress Notes (Signed)
 "  Chief Complaint  Patient presents with   Medicare Wellness     Subjective:   Deanna Becker is a 79 y.o. female who presents for a Medicare Annual Wellness Visit.  Visit info / Clinical Intake: Medicare Wellness Visit Type:: Subsequent Annual Wellness Visit Persons participating in visit and providing information:: patient Medicare Wellness Visit Mode:: Telephone If telephone:: video declined Since this visit was completed virtually, some vitals may be partially provided or unavailable. Missing vitals are due to the limitations of the virtual format.: Unable to obtain vitals - no equipment If Telephone or Video please confirm:: I connected with patient using audio/video enable telemedicine. I verified patient identity with two identifiers, discussed telehealth limitations, and patient agreed to proceed. Patient Location:: home Provider Location:: office Interpreter Needed?: No Pre-visit prep was completed: no AWV questionnaire completed by patient prior to visit?: no Living arrangements:: lives with spouse/significant other Patient's Overall Health Status Rating: very good Typical amount of pain: none Does pain affect daily life?: no Are you currently prescribed opioids?: no  Dietary Habits and Nutritional Risks How many meals a day?: 2 Eats fruit and vegetables daily?: yes Most meals are obtained by: preparing own meals In the last 2 weeks, have you had any of the following?: none Diabetic:: no  Functional Status Activities of Daily Living (to include ambulation/medication): Independent Ambulation: Independent Medication Administration: Independent Home Management (perform basic housework or laundry): Independent Manage your own finances?: yes Primary transportation is: driving Concerns about vision?: no *vision screening is required for WTM* (last ov over a couple yrs) Concerns about hearing?: no  Fall Screening Falls in the past year?: 0 Number of falls in past  year: 0 Was there an injury with Fall?: 0 Fall Risk Category Calculator: 0 Patient Fall Risk Level: Low Fall Risk  Fall Risk Patient at Risk for Falls Due to: No Fall Risks Fall risk Follow up: Falls evaluation completed; Education provided  Home and Transportation Safety: All rugs have non-skid backing?: yes All stairs or steps have railings?: yes Grab bars in the bathtub or shower?: yes Have non-skid surface in bathtub or shower?: yes Good home lighting?: yes Regular seat belt use?: yes Hospital stays in the last year:: no  Cognitive Assessment Difficulty concentrating, remembering, or making decisions? : no Will 6CIT or Mini Cog be Completed: yes What year is it?: 0 points What month is it?: 0 points Give patient an address phrase to remember (5 components): [address] [place] what time is it?: 0 points Count backwards from 20 to 1: 0 points Say the months of the year in reverse: 0 points Repeat the address phrase from earlier: 0 points 6 CIT Score: 0 points  Advance Directives (For Healthcare) Does Patient Have a Medical Advance Directive?: No Would patient like information on creating a medical advance directive?: No - Patient declined  Reviewed/Updated  Reviewed/Updated: Reviewed All (Medical, Surgical, Family, Medications, Allergies, Care Teams, Patient Goals); Medical History; Surgical History; Family History; Medications; Allergies; Care Teams; Patient Goals    Allergies (verified) Amlodipine  and Sulfa antibiotics   Current Medications (verified) Outpatient Encounter Medications as of 08/22/2024  Medication Sig   aspirin  EC 81 MG tablet Take 1 tablet (81 mg total) by mouth daily.   Calcium  Carbonate (CALCIUM  500 PO) Take by mouth.   Cholecalciferol (VITAMIN D3 PO) Take by mouth.   cloNIDine  (CATAPRES ) 0.3 MG tablet TAKE 1 TABLET BY MOUTH TWICE DAILY   hydrochlorothiazide  (HYDRODIURIL ) 25 MG tablet TAKE 1 TABLET(25  MG) BY MOUTH DAILY    lisinopril  (ZESTRIL ) 40 MG tablet TAKE 1 TABLET(40 MG) BY MOUTH DAILY   metoprolol  tartrate (LOPRESSOR ) 100 MG tablet TAKE 1 TABLET BY MOUTH TWICE DAILY   simvastatin  (ZOCOR ) 40 MG tablet TAKE 1 TABLET(40 MG) BY MOUTH DAILY AT 6 PM   ZINC OXIDE PO Take by mouth.   No facility-administered encounter medications on file as of 08/22/2024.    History: Past Medical History:  Diagnosis Date   Allergy Not sure   Heart murmur    History of pneumonia    Hyperlipidemia    Hypertension    Past Surgical History:  Procedure Laterality Date   gangloin cyst     Family History  Problem Relation Age of Onset   Asthma Mother    Heart disease Father    Social History   Occupational History   Not on file  Tobacco Use   Smoking status: Never   Smokeless tobacco: Never  Substance and Sexual Activity   Alcohol use: Yes    Comment: occasional   Drug use: No   Sexual activity: Not on file   Tobacco Counseling Counseling given: Yes  SDOH Screenings   Food Insecurity: No Food Insecurity (08/22/2024)  Housing: Low Risk (08/22/2024)  Transportation Needs: No Transportation Needs (08/22/2024)  Utilities: Not At Risk (08/22/2024)  Alcohol Screen: Low Risk (09/07/2023)  Depression (PHQ2-9): Low Risk (08/22/2024)  Financial Resource Strain: Low Risk (09/07/2023)  Physical Activity: Sufficiently Active (08/22/2024)  Social Connections: Socially Integrated (08/22/2024)  Stress: No Stress Concern Present (08/22/2024)  Tobacco Use: Low Risk (08/22/2024)  Health Literacy: Adequate Health Literacy (08/22/2024)   See flowsheets for full screening details  Depression Screen PHQ 2 & 9 Depression Scale- Over the past 2 weeks, how often have you been bothered by any of the following problems? Little interest or pleasure in doing things: 0 Feeling down, depressed, or hopeless (PHQ Adolescent also includes...irritable): 0 PHQ-2 Total Score: 0     Goals Addressed             This Visit's Progress     Patient Stated   On track    Lose a little more weight              Objective:    There were no vitals filed for this visit. There is no height or weight on file to calculate BMI.  Hearing/Vision screen No results found. Immunizations and Health Maintenance Health Maintenance  Topic Date Due   Bone Density Scan  02/18/2024   COVID-19 Vaccine (4 - 2025-26 season) 04/11/2024   DTaP/Tdap/Td (2 - Td or Tdap) 09/07/2024 (Originally 09/01/2023)   Medicare Annual Wellness (AWV)  08/22/2025   Pneumococcal Vaccine: 50+ Years  Completed   Influenza Vaccine  Completed   Hepatitis C Screening  Completed   Zoster Vaccines- Shingrix   Completed   Meningococcal B Vaccine  Aged Out   Fecal DNA (Cologuard)  Discontinued        Assessment/Plan:  This is a routine wellness examination for Huong.  Patient Care Team: Gladis Mustard, FNP as PCP - General (Nurse Practitioner)  I have personally reviewed and noted the following in the patients chart:   Medical and social history Use of alcohol, tobacco or illicit drugs  Current medications and supplements including opioid prescriptions. Functional ability and status Nutritional status Physical activity Advanced directives List of other physicians Hospitalizations, surgeries, and ER visits in previous 12 months Vitals Screenings to include cognitive, depression, and falls  Referrals and appointments  Orders Placed This Encounter  Procedures   DG [facility] DEXA    Standing Status:   Future    Expiration Date:   08/22/2025    Reason for Exam (SYMPTOM  OR DIAGNOSIS REQUIRED):   bone density   In addition, I have reviewed and discussed with patient certain preventive protocols, quality metrics, and best practice recommendations. A written personalized care plan for preventive services as well as general preventive health recommendations were provided to patient.   Ozie Ned, [facility]   08/22/2024   No follow-ups on file.  After  Visit Summary: (MyChart) Due to this being a telephonic visit, the after visit summary with patients personalized plan was offered to patient via MyChart   Nurse Notes: HM Addressed: DEXA ordered/aware of Covid vaccines  "

## 2024-09-13 ENCOUNTER — Other Ambulatory Visit: Payer: Self-pay | Admitting: Nurse Practitioner

## 2024-09-13 DIAGNOSIS — I10 Essential (primary) hypertension: Secondary | ICD-10-CM

## 2024-09-27 ENCOUNTER — Other Ambulatory Visit

## 2024-09-27 ENCOUNTER — Ambulatory Visit: Payer: Self-pay | Admitting: Nurse Practitioner

## 2025-08-23 ENCOUNTER — Ambulatory Visit
